# Patient Record
Sex: Female | Born: 1970 | Race: White | Hispanic: No | Smoking: Never smoker
Health system: Southern US, Community
[De-identification: ages and names within clinical notes are randomized; demographics above are authoritative.]

## PROBLEM LIST (undated history)

## (undated) DIAGNOSIS — Z9889 Other specified postprocedural states: Secondary | ICD-10-CM

## (undated) DIAGNOSIS — I509 Heart failure, unspecified: Secondary | ICD-10-CM

## (undated) DIAGNOSIS — R112 Nausea with vomiting, unspecified: Secondary | ICD-10-CM

## (undated) DIAGNOSIS — C50919 Malignant neoplasm of unspecified site of unspecified female breast: Secondary | ICD-10-CM

## (undated) DIAGNOSIS — N200 Calculus of kidney: Secondary | ICD-10-CM

## (undated) DIAGNOSIS — N35919 Unspecified urethral stricture, male, unspecified site: Secondary | ICD-10-CM

## (undated) DIAGNOSIS — N840 Polyp of corpus uteri: Secondary | ICD-10-CM

## (undated) HISTORY — DX: Polyp of corpus uteri: N84.0

## (undated) HISTORY — DX: Calculus of kidney: N20.0

---

## 1998-01-27 ENCOUNTER — Emergency Department (HOSPITAL_COMMUNITY): Admission: EM | Admit: 1998-01-27 | Discharge: 1998-01-27 | Payer: Self-pay | Admitting: Emergency Medicine

## 1999-03-28 ENCOUNTER — Other Ambulatory Visit: Admission: RE | Admit: 1999-03-28 | Discharge: 1999-03-28 | Payer: Self-pay | Admitting: Gynecology

## 2000-03-26 ENCOUNTER — Other Ambulatory Visit: Admission: RE | Admit: 2000-03-26 | Discharge: 2000-03-26 | Payer: Self-pay | Admitting: Gynecology

## 2001-05-06 ENCOUNTER — Other Ambulatory Visit: Admission: RE | Admit: 2001-05-06 | Discharge: 2001-05-06 | Payer: Self-pay | Admitting: Gynecology

## 2002-04-10 HISTORY — PX: BREAST LUMPECTOMY: SHX2

## 2002-05-11 DIAGNOSIS — C50919 Malignant neoplasm of unspecified site of unspecified female breast: Secondary | ICD-10-CM

## 2002-05-11 HISTORY — DX: Malignant neoplasm of unspecified site of unspecified female breast: C50.919

## 2002-05-21 ENCOUNTER — Other Ambulatory Visit: Admission: RE | Admit: 2002-05-21 | Discharge: 2002-05-21 | Payer: Self-pay | Admitting: Gynecology

## 2002-05-23 ENCOUNTER — Encounter: Payer: Self-pay | Admitting: Gynecology

## 2002-05-23 ENCOUNTER — Encounter (INDEPENDENT_AMBULATORY_CARE_PROVIDER_SITE_OTHER): Payer: Self-pay | Admitting: Specialist

## 2002-05-23 ENCOUNTER — Encounter: Admission: RE | Admit: 2002-05-23 | Discharge: 2002-05-23 | Payer: Self-pay | Admitting: Gynecology

## 2002-06-02 ENCOUNTER — Ambulatory Visit (HOSPITAL_COMMUNITY): Admission: RE | Admit: 2002-06-02 | Discharge: 2002-06-02 | Payer: Self-pay | Admitting: Surgery

## 2002-06-02 ENCOUNTER — Encounter: Payer: Self-pay | Admitting: Surgery

## 2002-06-03 ENCOUNTER — Encounter: Payer: Self-pay | Admitting: Surgery

## 2002-06-05 ENCOUNTER — Encounter: Payer: Self-pay | Admitting: Surgery

## 2002-06-05 ENCOUNTER — Encounter: Admission: RE | Admit: 2002-06-05 | Discharge: 2002-06-05 | Payer: Self-pay | Admitting: Surgery

## 2002-06-06 ENCOUNTER — Ambulatory Visit (HOSPITAL_COMMUNITY): Admission: RE | Admit: 2002-06-06 | Discharge: 2002-06-06 | Payer: Self-pay | Admitting: Surgery

## 2002-06-06 ENCOUNTER — Encounter (INDEPENDENT_AMBULATORY_CARE_PROVIDER_SITE_OTHER): Payer: Self-pay | Admitting: *Deleted

## 2002-06-06 ENCOUNTER — Ambulatory Visit (HOSPITAL_BASED_OUTPATIENT_CLINIC_OR_DEPARTMENT_OTHER): Admission: RE | Admit: 2002-06-06 | Discharge: 2002-06-06 | Payer: Self-pay | Admitting: Surgery

## 2002-06-06 ENCOUNTER — Encounter: Payer: Self-pay | Admitting: Surgery

## 2002-06-12 ENCOUNTER — Ambulatory Visit (HOSPITAL_COMMUNITY): Admission: RE | Admit: 2002-06-12 | Discharge: 2002-06-23 | Payer: Self-pay | Admitting: Surgery

## 2002-07-01 ENCOUNTER — Encounter: Payer: Self-pay | Admitting: Surgery

## 2002-07-01 ENCOUNTER — Ambulatory Visit (HOSPITAL_BASED_OUTPATIENT_CLINIC_OR_DEPARTMENT_OTHER): Admission: RE | Admit: 2002-07-01 | Discharge: 2002-07-01 | Payer: Self-pay | Admitting: Surgery

## 2002-07-03 ENCOUNTER — Ambulatory Visit (HOSPITAL_COMMUNITY): Admission: RE | Admit: 2002-07-03 | Discharge: 2002-07-03 | Payer: Self-pay | Admitting: Oncology

## 2002-07-03 ENCOUNTER — Encounter: Payer: Self-pay | Admitting: Oncology

## 2002-07-09 ENCOUNTER — Ambulatory Visit (HOSPITAL_COMMUNITY): Admission: RE | Admit: 2002-07-09 | Discharge: 2002-07-09 | Payer: Self-pay | Admitting: Oncology

## 2002-07-09 ENCOUNTER — Encounter: Payer: Self-pay | Admitting: Oncology

## 2002-07-16 ENCOUNTER — Ambulatory Visit (HOSPITAL_BASED_OUTPATIENT_CLINIC_OR_DEPARTMENT_OTHER): Admission: RE | Admit: 2002-07-16 | Discharge: 2002-07-16 | Payer: Self-pay | Admitting: Urology

## 2002-07-16 ENCOUNTER — Encounter: Payer: Self-pay | Admitting: Urology

## 2002-07-21 ENCOUNTER — Ambulatory Visit (HOSPITAL_BASED_OUTPATIENT_CLINIC_OR_DEPARTMENT_OTHER): Admission: RE | Admit: 2002-07-21 | Discharge: 2002-07-21 | Payer: Self-pay | Admitting: Urology

## 2002-07-21 ENCOUNTER — Encounter: Payer: Self-pay | Admitting: Urology

## 2002-10-23 ENCOUNTER — Encounter: Payer: Self-pay | Admitting: Oncology

## 2002-10-23 ENCOUNTER — Encounter: Admission: RE | Admit: 2002-10-23 | Discharge: 2002-10-23 | Payer: Self-pay | Admitting: Oncology

## 2002-10-29 ENCOUNTER — Ambulatory Visit: Admission: RE | Admit: 2002-10-29 | Discharge: 2002-10-29 | Payer: Self-pay | Admitting: Oncology

## 2002-11-06 ENCOUNTER — Ambulatory Visit: Admission: RE | Admit: 2002-11-06 | Discharge: 2003-01-08 | Payer: Self-pay | Admitting: Radiation Oncology

## 2002-11-10 ENCOUNTER — Encounter: Admission: RE | Admit: 2002-11-10 | Discharge: 2002-11-10 | Payer: Self-pay | Admitting: Radiation Oncology

## 2003-02-05 ENCOUNTER — Ambulatory Visit: Admission: RE | Admit: 2003-02-05 | Discharge: 2003-02-05 | Payer: Self-pay | Admitting: Radiation Oncology

## 2003-03-13 ENCOUNTER — Ambulatory Visit (HOSPITAL_BASED_OUTPATIENT_CLINIC_OR_DEPARTMENT_OTHER): Admission: RE | Admit: 2003-03-13 | Discharge: 2003-03-13 | Payer: Self-pay | Admitting: Surgery

## 2003-03-30 ENCOUNTER — Other Ambulatory Visit: Admission: RE | Admit: 2003-03-30 | Discharge: 2003-03-30 | Payer: Self-pay | Admitting: Gynecology

## 2003-08-06 ENCOUNTER — Ambulatory Visit: Admission: RE | Admit: 2003-08-06 | Discharge: 2003-08-06 | Payer: Self-pay | Admitting: Radiation Oncology

## 2003-09-01 ENCOUNTER — Encounter: Admission: RE | Admit: 2003-09-01 | Discharge: 2003-09-01 | Payer: Self-pay | Admitting: Oncology

## 2003-11-05 ENCOUNTER — Emergency Department (HOSPITAL_COMMUNITY): Admission: EM | Admit: 2003-11-05 | Discharge: 2003-11-05 | Payer: Self-pay | Admitting: Family Medicine

## 2003-12-10 ENCOUNTER — Ambulatory Visit (HOSPITAL_COMMUNITY): Admission: RE | Admit: 2003-12-10 | Discharge: 2003-12-10 | Payer: Self-pay | Admitting: Urology

## 2003-12-14 ENCOUNTER — Ambulatory Visit (HOSPITAL_COMMUNITY): Admission: EM | Admit: 2003-12-14 | Discharge: 2003-12-14 | Payer: Self-pay | Admitting: Emergency Medicine

## 2004-03-13 ENCOUNTER — Ambulatory Visit: Payer: Self-pay | Admitting: Oncology

## 2004-04-12 ENCOUNTER — Other Ambulatory Visit: Admission: RE | Admit: 2004-04-12 | Discharge: 2004-04-12 | Payer: Self-pay | Admitting: Gynecology

## 2004-06-13 ENCOUNTER — Ambulatory Visit: Payer: Self-pay | Admitting: Oncology

## 2004-08-09 ENCOUNTER — Ambulatory Visit: Payer: Self-pay | Admitting: Oncology

## 2004-08-24 ENCOUNTER — Ambulatory Visit (HOSPITAL_COMMUNITY): Admission: RE | Admit: 2004-08-24 | Discharge: 2004-08-24 | Payer: Self-pay | Admitting: Oncology

## 2004-09-06 ENCOUNTER — Encounter: Admission: RE | Admit: 2004-09-06 | Discharge: 2004-09-06 | Payer: Self-pay | Admitting: Oncology

## 2004-12-07 ENCOUNTER — Ambulatory Visit: Payer: Self-pay | Admitting: Oncology

## 2005-04-01 ENCOUNTER — Emergency Department (HOSPITAL_COMMUNITY): Admission: EM | Admit: 2005-04-01 | Discharge: 2005-04-02 | Payer: Self-pay | Admitting: Emergency Medicine

## 2005-04-07 ENCOUNTER — Ambulatory Visit (HOSPITAL_COMMUNITY): Admission: RE | Admit: 2005-04-07 | Discharge: 2005-04-07 | Payer: Self-pay | Admitting: Urology

## 2005-04-15 ENCOUNTER — Emergency Department (HOSPITAL_COMMUNITY): Admission: EM | Admit: 2005-04-15 | Discharge: 2005-04-15 | Payer: Self-pay | Admitting: Emergency Medicine

## 2005-06-01 ENCOUNTER — Ambulatory Visit: Payer: Self-pay | Admitting: Oncology

## 2005-07-20 ENCOUNTER — Encounter: Payer: Self-pay | Admitting: Cardiology

## 2005-07-20 ENCOUNTER — Ambulatory Visit: Payer: Self-pay

## 2005-07-27 ENCOUNTER — Ambulatory Visit: Payer: Self-pay

## 2005-08-01 ENCOUNTER — Ambulatory Visit: Payer: Self-pay | Admitting: Oncology

## 2005-09-08 ENCOUNTER — Encounter: Admission: RE | Admit: 2005-09-08 | Discharge: 2005-09-08 | Payer: Self-pay | Admitting: Oncology

## 2005-10-19 ENCOUNTER — Ambulatory Visit: Payer: Self-pay | Admitting: Oncology

## 2005-10-27 ENCOUNTER — Ambulatory Visit (HOSPITAL_COMMUNITY): Admission: RE | Admit: 2005-10-27 | Discharge: 2005-10-27 | Payer: Self-pay | Admitting: Oncology

## 2005-11-10 ENCOUNTER — Encounter: Payer: Self-pay | Admitting: Cardiology

## 2005-11-10 ENCOUNTER — Ambulatory Visit: Payer: Self-pay

## 2005-11-30 ENCOUNTER — Other Ambulatory Visit: Admission: RE | Admit: 2005-11-30 | Discharge: 2005-11-30 | Payer: Self-pay | Admitting: Gynecology

## 2006-01-18 ENCOUNTER — Ambulatory Visit: Payer: Self-pay | Admitting: Oncology

## 2006-02-02 ENCOUNTER — Ambulatory Visit (HOSPITAL_COMMUNITY): Admission: RE | Admit: 2006-02-02 | Discharge: 2006-02-02 | Payer: Self-pay | Admitting: Oncology

## 2006-04-16 ENCOUNTER — Ambulatory Visit: Payer: Self-pay | Admitting: Oncology

## 2006-04-19 LAB — LACTATE DEHYDROGENASE: LDH: 145 U/L (ref 94–250)

## 2006-04-19 LAB — CANCER ANTIGEN 27.29: CA 27.29: 16 U/mL (ref 0–39)

## 2006-04-20 ENCOUNTER — Ambulatory Visit (HOSPITAL_COMMUNITY): Admission: RE | Admit: 2006-04-20 | Discharge: 2006-04-20 | Payer: Self-pay | Admitting: Oncology

## 2006-08-06 ENCOUNTER — Ambulatory Visit: Payer: Self-pay | Admitting: Oncology

## 2006-08-08 ENCOUNTER — Ambulatory Visit (HOSPITAL_COMMUNITY): Admission: RE | Admit: 2006-08-08 | Discharge: 2006-08-08 | Payer: Self-pay | Admitting: Oncology

## 2006-08-09 LAB — RESEARCH LABS

## 2006-09-12 ENCOUNTER — Ambulatory Visit (HOSPITAL_COMMUNITY): Admission: RE | Admit: 2006-09-12 | Discharge: 2006-09-12 | Payer: Self-pay | Admitting: Oncology

## 2006-09-20 ENCOUNTER — Encounter: Admission: RE | Admit: 2006-09-20 | Discharge: 2006-09-20 | Payer: Self-pay | Admitting: Oncology

## 2006-10-15 ENCOUNTER — Ambulatory Visit (HOSPITAL_COMMUNITY): Admission: RE | Admit: 2006-10-15 | Discharge: 2006-10-15 | Payer: Self-pay | Admitting: Oncology

## 2007-01-29 ENCOUNTER — Ambulatory Visit: Payer: Self-pay | Admitting: Oncology

## 2007-02-06 LAB — CBC WITH DIFFERENTIAL/PLATELET
Basophils Absolute: 0 10*3/uL (ref 0.0–0.1)
EOS%: 2.1 % (ref 0.0–7.0)
Eosinophils Absolute: 0.1 10*3/uL (ref 0.0–0.5)
HGB: 14.1 g/dL (ref 11.6–15.9)
NEUT#: 2.5 10*3/uL (ref 1.5–6.5)
RBC: 4.33 10*6/uL (ref 3.70–5.32)
RDW: 12.9 % (ref 11.3–14.5)
lymph#: 1 10*3/uL (ref 0.9–3.3)

## 2007-02-06 LAB — LUTEINIZING HORMONE: LH: 9.1 m[IU]/mL

## 2007-02-06 LAB — COMPREHENSIVE METABOLIC PANEL
AST: 17 U/L (ref 0–37)
Albumin: 4.2 g/dL (ref 3.5–5.2)
BUN: 13 mg/dL (ref 6–23)
Calcium: 9.5 mg/dL (ref 8.4–10.5)
Chloride: 105 mEq/L (ref 96–112)
Potassium: 4.2 mEq/L (ref 3.5–5.3)
Sodium: 141 mEq/L (ref 135–145)
Total Protein: 6.8 g/dL (ref 6.0–8.3)

## 2007-02-28 ENCOUNTER — Ambulatory Visit (HOSPITAL_COMMUNITY): Admission: RE | Admit: 2007-02-28 | Discharge: 2007-02-28 | Payer: Self-pay | Admitting: Oncology

## 2007-07-12 ENCOUNTER — Ambulatory Visit (HOSPITAL_COMMUNITY): Admission: RE | Admit: 2007-07-12 | Discharge: 2007-07-12 | Payer: Self-pay | Admitting: Urology

## 2007-07-17 ENCOUNTER — Ambulatory Visit (HOSPITAL_COMMUNITY): Admission: RE | Admit: 2007-07-17 | Discharge: 2007-07-17 | Payer: Self-pay | Admitting: Urology

## 2007-07-19 ENCOUNTER — Ambulatory Visit: Payer: Self-pay | Admitting: Oncology

## 2007-07-24 LAB — CBC WITH DIFFERENTIAL/PLATELET
Eosinophils Absolute: 0.2 10*3/uL (ref 0.0–0.5)
HGB: 14.1 g/dL (ref 11.6–15.9)
MONO#: 0.4 10*3/uL (ref 0.1–0.9)
MONO%: 7.5 % (ref 0.0–13.0)
NEUT#: 2.9 10*3/uL (ref 1.5–6.5)
RBC: 4.4 10*6/uL (ref 3.70–5.32)
RDW: 12.3 % (ref 11.3–14.5)
WBC: 4.9 10*3/uL (ref 3.9–10.0)
lymph#: 1.4 10*3/uL (ref 0.9–3.3)

## 2007-07-25 LAB — COMPREHENSIVE METABOLIC PANEL
Albumin: 4.2 g/dL (ref 3.5–5.2)
Alkaline Phosphatase: 61 U/L (ref 39–117)
Calcium: 9.1 mg/dL (ref 8.4–10.5)
Chloride: 108 mEq/L (ref 96–112)
Glucose, Bld: 107 mg/dL — ABNORMAL HIGH (ref 70–99)
Potassium: 4.4 mEq/L (ref 3.5–5.3)
Sodium: 142 mEq/L (ref 135–145)
Total Protein: 6.8 g/dL (ref 6.0–8.3)

## 2007-07-25 LAB — LACTATE DEHYDROGENASE: LDH: 154 U/L (ref 94–250)

## 2007-07-30 LAB — VITAMIN D 1,25 DIHYDROXY: Vit D, 1,25-Dihydroxy: 88 pg/mL — ABNORMAL HIGH (ref 15–75)

## 2008-01-01 ENCOUNTER — Encounter: Admission: RE | Admit: 2008-01-01 | Discharge: 2008-01-01 | Payer: Self-pay | Admitting: Oncology

## 2008-01-28 ENCOUNTER — Ambulatory Visit: Payer: Self-pay | Admitting: Oncology

## 2008-01-30 LAB — CBC WITH DIFFERENTIAL/PLATELET
BASO%: 0.6 % (ref 0.0–2.0)
EOS%: 1.6 % (ref 0.0–7.0)
HCT: 39.8 % (ref 34.8–46.6)
LYMPH%: 29 % (ref 14.0–48.0)
MCH: 32.7 pg (ref 26.0–34.0)
MCHC: 35.8 g/dL (ref 32.0–36.0)
NEUT%: 60.6 % (ref 39.6–76.8)
Platelets: 224 10*3/uL (ref 145–400)
RBC: 4.35 10*6/uL (ref 3.70–5.32)
WBC: 5.2 10*3/uL (ref 3.9–10.0)
lymph#: 1.5 10*3/uL (ref 0.9–3.3)

## 2008-01-31 LAB — COMPREHENSIVE METABOLIC PANEL
ALT: 12 U/L (ref 0–35)
AST: 14 U/L (ref 0–37)
Alkaline Phosphatase: 66 U/L (ref 39–117)
BUN: 14 mg/dL (ref 6–23)
Creatinine, Ser: 0.81 mg/dL (ref 0.40–1.20)

## 2008-01-31 LAB — VITAMIN D 25 HYDROXY (VIT D DEFICIENCY, FRACTURES): Vit D, 25-Hydroxy: 37 ng/mL (ref 30–89)

## 2008-01-31 LAB — FOLLICLE STIMULATING HORMONE: FSH: 21.6 m[IU]/mL

## 2008-02-06 LAB — ESTRADIOL, ULTRA SENS

## 2008-08-04 ENCOUNTER — Ambulatory Visit: Payer: Self-pay | Admitting: Oncology

## 2008-08-06 LAB — CBC WITH DIFFERENTIAL/PLATELET
Basophils Absolute: 0 10*3/uL (ref 0.0–0.1)
EOS%: 1 % (ref 0.0–7.0)
Eosinophils Absolute: 0.1 10*3/uL (ref 0.0–0.5)
HCT: 42.1 % (ref 34.8–46.6)
HGB: 14.6 g/dL (ref 11.6–15.9)
MCH: 32 pg (ref 25.1–34.0)
MONO#: 0.5 10*3/uL (ref 0.1–0.9)
NEUT#: 4.1 10*3/uL (ref 1.5–6.5)
NEUT%: 68.6 % (ref 38.4–76.8)
RDW: 12.8 % (ref 11.2–14.5)
WBC: 6 10*3/uL (ref 3.9–10.3)
lymph#: 1.3 10*3/uL (ref 0.9–3.3)

## 2008-08-07 LAB — COMPREHENSIVE METABOLIC PANEL
AST: 14 U/L (ref 0–37)
Albumin: 4.5 g/dL (ref 3.5–5.2)
BUN: 13 mg/dL (ref 6–23)
CO2: 22 mEq/L (ref 19–32)
Calcium: 9.2 mg/dL (ref 8.4–10.5)
Chloride: 107 mEq/L (ref 96–112)
Creatinine, Ser: 0.85 mg/dL (ref 0.40–1.20)
Potassium: 3.6 mEq/L (ref 3.5–5.3)

## 2008-08-07 LAB — VITAMIN D 25 HYDROXY (VIT D DEFICIENCY, FRACTURES): Vit D, 25-Hydroxy: 40 ng/mL (ref 30–89)

## 2008-08-07 LAB — LACTATE DEHYDROGENASE: LDH: 169 U/L (ref 94–250)

## 2009-02-19 ENCOUNTER — Encounter: Admission: RE | Admit: 2009-02-19 | Discharge: 2009-02-19 | Payer: Self-pay | Admitting: Oncology

## 2009-02-26 ENCOUNTER — Ambulatory Visit: Payer: Self-pay | Admitting: Oncology

## 2009-03-02 LAB — CBC WITH DIFFERENTIAL/PLATELET
BASO%: 0.6 % (ref 0.0–2.0)
Eosinophils Absolute: 0.1 10*3/uL (ref 0.0–0.5)
MCHC: 34.2 g/dL (ref 31.5–36.0)
MCV: 92.8 fL (ref 79.5–101.0)
MONO#: 0.4 10*3/uL (ref 0.1–0.9)
MONO%: 5.6 % (ref 0.0–14.0)
NEUT#: 5.4 10*3/uL (ref 1.5–6.5)
RBC: 4.23 10*6/uL (ref 3.70–5.45)
RDW: 12.6 % (ref 11.2–14.5)
WBC: 7.9 10*3/uL (ref 3.9–10.3)

## 2009-03-03 LAB — VITAMIN D 25 HYDROXY (VIT D DEFICIENCY, FRACTURES): Vit D, 25-Hydroxy: 29 ng/mL — ABNORMAL LOW (ref 30–89)

## 2009-03-03 LAB — COMPREHENSIVE METABOLIC PANEL
ALT: 12 U/L (ref 0–35)
Albumin: 4.4 g/dL (ref 3.5–5.2)
Alkaline Phosphatase: 65 U/L (ref 39–117)
Glucose, Bld: 113 mg/dL — ABNORMAL HIGH (ref 70–99)
Potassium: 4 mEq/L (ref 3.5–5.3)
Sodium: 140 mEq/L (ref 135–145)
Total Bilirubin: 0.6 mg/dL (ref 0.3–1.2)
Total Protein: 7 g/dL (ref 6.0–8.3)

## 2009-03-03 LAB — LACTATE DEHYDROGENASE: LDH: 165 U/L (ref 94–250)

## 2009-03-03 LAB — FOLLICLE STIMULATING HORMONE: FSH: 13.4 m[IU]/mL

## 2009-03-03 LAB — CANCER ANTIGEN 27.29: CA 27.29: 20 U/mL (ref 0–39)

## 2009-03-11 ENCOUNTER — Ambulatory Visit (HOSPITAL_BASED_OUTPATIENT_CLINIC_OR_DEPARTMENT_OTHER): Admission: RE | Admit: 2009-03-11 | Discharge: 2009-03-11 | Payer: Self-pay | Admitting: Gynecology

## 2009-03-11 HISTORY — PX: HYSTEROSCOPY W/ ENDOMETRIAL ABLATION: SUR665

## 2009-03-15 LAB — ESTRADIOL, ULTRA SENS: Estradiol, Ultra Sensitive: 256 pg/mL

## 2009-07-19 ENCOUNTER — Ambulatory Visit: Payer: Self-pay | Admitting: Oncology

## 2009-07-21 LAB — LACTATE DEHYDROGENASE: LDH: 157 U/L (ref 94–250)

## 2009-07-21 LAB — CBC WITH DIFFERENTIAL/PLATELET
BASO%: 0.3 % (ref 0.0–2.0)
HCT: 38.6 % (ref 34.8–46.6)
HGB: 13.5 g/dL (ref 11.6–15.9)
LYMPH%: 23.5 % (ref 14.0–49.7)
MCH: 32.1 pg (ref 25.1–34.0)
MCHC: 34.8 g/dL (ref 31.5–36.0)
MCV: 92 fL (ref 79.5–101.0)
MONO#: 0.4 10*3/uL (ref 0.1–0.9)
MONO%: 6.4 % (ref 0.0–14.0)
NEUT#: 4.3 10*3/uL (ref 1.5–6.5)
RBC: 4.2 10*6/uL (ref 3.70–5.45)
RDW: 13 % (ref 11.2–14.5)
WBC: 6.2 10*3/uL (ref 3.9–10.3)

## 2009-07-21 LAB — VITAMIN D 25 HYDROXY (VIT D DEFICIENCY, FRACTURES): Vit D, 25-Hydroxy: 31 ng/mL (ref 30–89)

## 2009-07-21 LAB — COMPREHENSIVE METABOLIC PANEL
ALT: 11 U/L (ref 0–35)
Albumin: 4.1 g/dL (ref 3.5–5.2)
Alkaline Phosphatase: 69 U/L (ref 39–117)
Glucose, Bld: 88 mg/dL (ref 70–99)
Potassium: 4.1 mEq/L (ref 3.5–5.3)

## 2009-07-21 LAB — TSH: TSH: 1.439 u[IU]/mL (ref 0.350–4.500)

## 2010-01-07 ENCOUNTER — Ambulatory Visit: Payer: Self-pay | Admitting: Oncology

## 2010-03-07 ENCOUNTER — Encounter: Admission: RE | Admit: 2010-03-07 | Discharge: 2010-03-07 | Payer: Self-pay | Admitting: Oncology

## 2010-04-30 ENCOUNTER — Encounter: Payer: Self-pay | Admitting: Oncology

## 2010-05-01 ENCOUNTER — Encounter: Payer: Self-pay | Admitting: Oncology

## 2010-05-02 ENCOUNTER — Encounter: Payer: Self-pay | Admitting: Oncology

## 2010-07-21 ENCOUNTER — Encounter (HOSPITAL_BASED_OUTPATIENT_CLINIC_OR_DEPARTMENT_OTHER): Payer: BC Managed Care – PPO | Admitting: Oncology

## 2010-07-21 ENCOUNTER — Other Ambulatory Visit: Payer: Self-pay | Admitting: Oncology

## 2010-07-21 DIAGNOSIS — C50919 Malignant neoplasm of unspecified site of unspecified female breast: Secondary | ICD-10-CM

## 2010-07-21 LAB — CBC WITH DIFFERENTIAL/PLATELET
BASO%: 1.1 % (ref 0.0–2.0)
Basophils Absolute: 0.1 10*3/uL (ref 0.0–0.1)
Eosinophils Absolute: 0.1 10*3/uL (ref 0.0–0.5)
HGB: 14.2 g/dL (ref 11.6–15.9)
LYMPH%: 24.7 % (ref 14.0–49.7)
MCH: 31.5 pg (ref 25.1–34.0)
MCHC: 34.7 g/dL (ref 31.5–36.0)
MCV: 90.7 fL (ref 79.5–101.0)
MONO#: 0.5 10*3/uL (ref 0.1–0.9)
MONO%: 6.5 % (ref 0.0–14.0)
RBC: 4.51 10*6/uL (ref 3.70–5.45)

## 2010-07-22 LAB — COMPREHENSIVE METABOLIC PANEL
ALT: 19 U/L (ref 0–35)
Alkaline Phosphatase: 106 U/L (ref 39–117)
CO2: 24 mEq/L (ref 19–32)
Creatinine, Ser: 0.86 mg/dL (ref 0.40–1.20)
Glucose, Bld: 92 mg/dL (ref 70–99)
Total Bilirubin: 0.6 mg/dL (ref 0.3–1.2)

## 2010-07-22 LAB — CANCER ANTIGEN 27.29: CA 27.29: 196 U/mL — ABNORMAL HIGH (ref 0–39)

## 2010-07-22 LAB — LACTATE DEHYDROGENASE: LDH: 274 U/L — ABNORMAL HIGH (ref 94–250)

## 2010-07-27 ENCOUNTER — Other Ambulatory Visit: Payer: Self-pay | Admitting: Oncology

## 2010-07-27 ENCOUNTER — Encounter (HOSPITAL_BASED_OUTPATIENT_CLINIC_OR_DEPARTMENT_OTHER): Payer: BC Managed Care – PPO | Admitting: Oncology

## 2010-07-27 DIAGNOSIS — C50919 Malignant neoplasm of unspecified site of unspecified female breast: Secondary | ICD-10-CM

## 2010-07-27 LAB — CANCER ANTIGEN 27.29: CA 27.29: 223 U/mL — ABNORMAL HIGH (ref 0–39)

## 2010-07-27 LAB — CEA: CEA: 13.6 ng/mL — ABNORMAL HIGH (ref 0.0–5.0)

## 2010-07-28 ENCOUNTER — Ambulatory Visit (HOSPITAL_COMMUNITY)
Admission: RE | Admit: 2010-07-28 | Discharge: 2010-07-28 | Disposition: A | Discharge status: Home / Self Care | Payer: BC Managed Care – PPO | Source: Ambulatory Visit | Attending: Oncology | Admitting: Oncology

## 2010-07-28 ENCOUNTER — Other Ambulatory Visit: Payer: Self-pay | Admitting: Oncology

## 2010-07-28 ENCOUNTER — Encounter (HOSPITAL_BASED_OUTPATIENT_CLINIC_OR_DEPARTMENT_OTHER): Payer: BC Managed Care – PPO | Admitting: Oncology

## 2010-07-28 DIAGNOSIS — C50919 Malignant neoplasm of unspecified site of unspecified female breast: Secondary | ICD-10-CM

## 2010-07-28 DIAGNOSIS — Z853 Personal history of malignant neoplasm of breast: Secondary | ICD-10-CM | POA: Insufficient documentation

## 2010-07-28 DIAGNOSIS — C787 Secondary malignant neoplasm of liver and intrahepatic bile duct: Secondary | ICD-10-CM

## 2010-08-02 ENCOUNTER — Ambulatory Visit (HOSPITAL_COMMUNITY)
Admission: RE | Admit: 2010-08-02 | Discharge: 2010-08-02 | Disposition: A | Discharge status: Home / Self Care | Payer: BC Managed Care – PPO | Source: Ambulatory Visit | Attending: Oncology | Admitting: Oncology

## 2010-08-02 ENCOUNTER — Other Ambulatory Visit: Payer: Self-pay | Admitting: Oncology

## 2010-08-02 ENCOUNTER — Encounter (HOSPITAL_COMMUNITY)
Admission: RE | Admit: 2010-08-02 | Discharge: 2010-08-02 | Disposition: A | Discharge status: Home / Self Care | Payer: BC Managed Care – PPO | Source: Ambulatory Visit | Attending: Oncology | Admitting: Oncology

## 2010-08-02 DIAGNOSIS — N200 Calculus of kidney: Secondary | ICD-10-CM | POA: Insufficient documentation

## 2010-08-02 DIAGNOSIS — C50919 Malignant neoplasm of unspecified site of unspecified female breast: Secondary | ICD-10-CM

## 2010-08-02 DIAGNOSIS — R599 Enlarged lymph nodes, unspecified: Secondary | ICD-10-CM | POA: Insufficient documentation

## 2010-08-02 DIAGNOSIS — C7951 Secondary malignant neoplasm of bone: Secondary | ICD-10-CM | POA: Insufficient documentation

## 2010-08-02 DIAGNOSIS — R51 Headache: Secondary | ICD-10-CM | POA: Insufficient documentation

## 2010-08-02 DIAGNOSIS — C787 Secondary malignant neoplasm of liver and intrahepatic bile duct: Secondary | ICD-10-CM | POA: Insufficient documentation

## 2010-08-02 MED ORDER — FLUDEOXYGLUCOSE F - 18 (FDG) INJECTION
16.5000 | Freq: Once | INTRAVENOUS | Status: AC | PRN
  Administered 2010-08-02: 16.5 via INTRAVENOUS

## 2010-08-02 MED ORDER — GADOBENATE DIMEGLUMINE 529 MG/ML IV SOLN
20.0000 mL | Freq: Once | INTRAVENOUS | Status: AC | PRN
  Administered 2010-08-02: 20 mL via INTRAVENOUS

## 2010-08-03 ENCOUNTER — Encounter: Payer: BC Managed Care – PPO | Admitting: Oncology

## 2010-08-03 ENCOUNTER — Other Ambulatory Visit: Payer: Self-pay | Admitting: Oncology

## 2010-08-03 DIAGNOSIS — Z5111 Encounter for antineoplastic chemotherapy: Secondary | ICD-10-CM

## 2010-08-03 DIAGNOSIS — C50919 Malignant neoplasm of unspecified site of unspecified female breast: Secondary | ICD-10-CM

## 2010-08-03 DIAGNOSIS — M899 Disorder of bone, unspecified: Secondary | ICD-10-CM

## 2010-08-04 ENCOUNTER — Other Ambulatory Visit: Payer: Self-pay | Admitting: Oncology

## 2010-08-04 DIAGNOSIS — C50919 Malignant neoplasm of unspecified site of unspecified female breast: Secondary | ICD-10-CM

## 2010-08-05 ENCOUNTER — Ambulatory Visit (HOSPITAL_COMMUNITY)
Admission: RE | Admit: 2010-08-05 | Discharge: 2010-08-05 | Disposition: A | Discharge status: Home / Self Care | Payer: BC Managed Care – PPO | Source: Ambulatory Visit | Attending: Oncology | Admitting: Oncology

## 2010-08-05 ENCOUNTER — Other Ambulatory Visit: Payer: Self-pay | Admitting: Oncology

## 2010-08-05 ENCOUNTER — Other Ambulatory Visit: Payer: Self-pay | Admitting: Interventional Radiology

## 2010-08-05 ENCOUNTER — Ambulatory Visit (HOSPITAL_COMMUNITY): Payer: BC Managed Care – PPO

## 2010-08-05 DIAGNOSIS — C50919 Malignant neoplasm of unspecified site of unspecified female breast: Secondary | ICD-10-CM

## 2010-08-05 DIAGNOSIS — Z853 Personal history of malignant neoplasm of breast: Secondary | ICD-10-CM | POA: Insufficient documentation

## 2010-08-05 DIAGNOSIS — C7951 Secondary malignant neoplasm of bone: Secondary | ICD-10-CM | POA: Insufficient documentation

## 2010-08-05 DIAGNOSIS — C787 Secondary malignant neoplasm of liver and intrahepatic bile duct: Secondary | ICD-10-CM | POA: Insufficient documentation

## 2010-08-05 LAB — CBC
MCH: 30 pg (ref 26.0–34.0)
MCHC: 34.8 g/dL (ref 30.0–36.0)
Platelets: 212 10*3/uL (ref 150–400)
RBC: 4.46 MIL/uL (ref 3.87–5.11)
RDW: 11.9 % (ref 11.5–15.5)

## 2010-08-05 LAB — PROTIME-INR: Prothrombin Time: 13.8 seconds (ref 11.6–15.2)

## 2010-08-08 ENCOUNTER — Ambulatory Visit (HOSPITAL_COMMUNITY)
Admission: RE | Admit: 2010-08-08 | Discharge: 2010-08-08 | Disposition: A | Discharge status: Home / Self Care | Payer: BC Managed Care – PPO | Source: Ambulatory Visit | Attending: Oncology | Admitting: Oncology

## 2010-08-08 DIAGNOSIS — M542 Cervicalgia: Secondary | ICD-10-CM | POA: Insufficient documentation

## 2010-08-08 DIAGNOSIS — C50919 Malignant neoplasm of unspecified site of unspecified female breast: Secondary | ICD-10-CM

## 2010-08-08 DIAGNOSIS — Z853 Personal history of malignant neoplasm of breast: Secondary | ICD-10-CM | POA: Insufficient documentation

## 2010-08-08 DIAGNOSIS — R51 Headache: Secondary | ICD-10-CM | POA: Insufficient documentation

## 2010-08-08 MED ORDER — GADOBENATE DIMEGLUMINE 529 MG/ML IV SOLN
15.0000 mL | Freq: Once | INTRAVENOUS | Status: AC | PRN
  Administered 2010-08-08: 15 mL via INTRAVENOUS

## 2010-08-09 ENCOUNTER — Other Ambulatory Visit: Payer: Self-pay | Admitting: Oncology

## 2010-08-09 DIAGNOSIS — C50919 Malignant neoplasm of unspecified site of unspecified female breast: Secondary | ICD-10-CM

## 2010-08-10 ENCOUNTER — Other Ambulatory Visit: Payer: Self-pay | Admitting: Oncology

## 2010-08-10 ENCOUNTER — Ambulatory Visit (HOSPITAL_COMMUNITY)
Admission: RE | Admit: 2010-08-10 | Discharge: 2010-08-10 | Disposition: A | Discharge status: Home / Self Care | Payer: BC Managed Care – PPO | Source: Ambulatory Visit | Attending: Oncology | Admitting: Oncology

## 2010-08-10 DIAGNOSIS — C50919 Malignant neoplasm of unspecified site of unspecified female breast: Secondary | ICD-10-CM | POA: Insufficient documentation

## 2010-08-10 DIAGNOSIS — I079 Rheumatic tricuspid valve disease, unspecified: Secondary | ICD-10-CM | POA: Insufficient documentation

## 2010-08-10 DIAGNOSIS — Z5111 Encounter for antineoplastic chemotherapy: Secondary | ICD-10-CM

## 2010-08-10 DIAGNOSIS — Z01818 Encounter for other preprocedural examination: Secondary | ICD-10-CM | POA: Insufficient documentation

## 2010-08-12 ENCOUNTER — Encounter (HOSPITAL_BASED_OUTPATIENT_CLINIC_OR_DEPARTMENT_OTHER): Payer: BC Managed Care – PPO | Admitting: Oncology

## 2010-08-12 DIAGNOSIS — C50919 Malignant neoplasm of unspecified site of unspecified female breast: Secondary | ICD-10-CM

## 2010-08-12 DIAGNOSIS — Z5111 Encounter for antineoplastic chemotherapy: Secondary | ICD-10-CM

## 2010-08-18 ENCOUNTER — Encounter (HOSPITAL_BASED_OUTPATIENT_CLINIC_OR_DEPARTMENT_OTHER): Payer: BC Managed Care – PPO | Admitting: Oncology

## 2010-08-18 DIAGNOSIS — C50919 Malignant neoplasm of unspecified site of unspecified female breast: Secondary | ICD-10-CM

## 2010-08-18 DIAGNOSIS — Z5112 Encounter for antineoplastic immunotherapy: Secondary | ICD-10-CM

## 2010-08-23 NOTE — Op Note (Signed)
NAME:  Kathy Jimenez, Kathy Jimenez             ACCOUNT NO.:  [id number]   MEDICAL RECORD NO.:  [id number]          PATIENT TYPE:  AMB   LOCATION:  DAY                          FACILITY:  [facility]   PHYSICIAN:  Courtney Paris, M.D.DATE OF BIRTH:  Aug 02, 1970   DATE OF PROCEDURE:  07/17/2007  DATE OF DISCHARGE:                               OPERATIVE REPORT   PREOPERATIVE DIAGNOSIS:  Right ureteral stone.   POSTOPERATIVE DIAGNOSIS:  Right ureteral stone.   PROCEDURE:  1. Cystourethroscopy.  2. Right ureteroscopy with laser lithotripsy and stone manipulation.  3. Placement of the right 6 x 24 double-J ureteral stent.   ANESTHESIA:  General.   INDICATIONS FOR PROCEDURE:  Kathy Jimenez is a 40 year old white female with  past medical history significant for nephroureterolithiasis.  She has  had multiple interventions in the past.  She has been followed closely  by Dr. Aldean Ast for this current right ureteral stone.  She has been  asymptomatic with this with a slight amount hydroureteronephrosis for  the last couple of months, however, recently she began having some  discomfort and her hydroureteronephrosis had increased significantly.  She was taken to the operating room last Friday for attempted  ureteroscopic stone manipulation.  She was noted to have a narrowing at  the junction of the middle third and distal third of her right ureter  that would not safely allow passage of the semi-rigid ureteroscope, so  Dr. Aldean Ast elected to place a ureteral stent and allow the ureter to  dilate up passively over the stent and then come back for repeat  ureteroscopy.  Up until this point, she has been on antibiotics since  her last procedure and she has done well with the stent.   PROCEDURE IN DETAIL:  The patient was brought to the operating room,  placed in supine position.  She was correctly identified by wristband  and the appropriate time-out was taken.  IV antibiotics were given and  general  anesthesia was delivered.  Once adequately anesthetized, she was  placed in dorsal lithotomy position with great care taken to minimize  the risk of peripheral neuropathy or compartment syndrome.  Her perineum  was prepped and draped sterilely.  We began our procedure by performing  rigid cystourethroscopy with a 22-French rigid cystoscopic sheath, 12  degree lens and normal saline as our irrigant.  Her urethra was normal  course and caliber.  Upon entry into the bladder, clear urine was  identified.  The right ureteral stent was noted to be effluxing clear  urine.  There was no bladder, foreign body seen, no stone material was  noted either.  Left ureteral orifice was also normal.  We grasped the  distal end of the right ureteral stent with flexible graspers and  externalized it through the urethra.  We then advanced a sensor tip  guidewire through the stent into the right renal pelvis under direct  fluoroscopic guidance.  We then removed the stent and left the guidewire  in place.  We then secured.  We also placed an 8-French Foley catheter  with 5 mL balloon on it to  decompress the bladder during ureteroscopy.  We advanced the semi-rigid ureteroscope into the right ureteral orifice  and guided it antegrade up the right ureter.  The distal ureter was  capacious in size secondary to the stent.  We did encounter the  ridge/lip of ureteral mucosa that Dr. Aldean Ast had initially  encountered.  We checked the location of the tip of our scope with  fluoroscopy and this was noted to be right at the cortex of the inferior  pubic ramus.  This is exactly where he found this abnormality on the  previous ureteroscopy.  We were able to bypass this rim of tissue  without significant difficulty and then encountered the ureteral stone.  It encompassed approximately 50% of the ureteral diameter and was a  fairly long stone.  It also appeared to be impacted into the posterior  wall of the ureter.  We  then used a 270 nanometer laser fiber with  settings 6 Hz and 0.6 joules and systematically dusted the stone into  very small fragments.  We then removed the laser fiber and advanced a  nitinol basket into the ureter and we were able to grasp a couple of the  smaller stone fragments and bring them back out and placed them into the  bladder.  Once our lithotripsy and stone manipulation was complete, we  then advanced the semi-rigid ureteroscope back up into the ureter and  advanced it proximally past the area of the obstruction.  No ureteral  abnormality was seen.  No stone fragments were noted.  The ureter was  essentially clear.  We then removed the Foley catheter and the flexible  ureteroscope.  We then back-loaded the cystoscope over the guidewire and  placed a 6 x 24 double-J stent over this using fluoroscopy and direct  vision.  Fluoroscopy verified the placement of the proximal curl to be  in the right renal pelvis and cystoscopy demonstrated the distal curl to  be in the bladder.  We did leave a tether on the stent.  We used the  cystoscope to also remove the majority of her stone fragments from her  bladder and these will be sent with the patient, possibly sent for stone  analysis.  Her bladder was drained and this marked the end of our  procedure. She tolerated the procedure well.  There were no  complications.  She awoke and was taken to the recovery room in stable  condition.  Dr. Aldean Ast was present and participated in all aspects of  the case.     ______________________________  Delman Kitten, M.D.      Courtney Paris, M.D.  Electronically Signed    DW/MEDQ  D:  07/17/2007  T:  07/17/2007  Job:  [id number]

## 2010-08-23 NOTE — Op Note (Signed)
NAME:  Kathy Jimenez, Kathy Jimenez             ACCOUNT NO.:  [id number]   MEDICAL RECORD NO.:  [id number]          PATIENT TYPE:  AMB   LOCATION:  DAY                          [facility]ACILITY:  [facility]   PHYSICIAN:  Courtney Paris, M.D.DATE OF BIRTH:  [birth year]   DATE OF PROCEDURE:  07/12/2007  DATE OF DISCHARGE:                               OPERATIVE REPORT   PREOPERATIVE DIAGNOSIS:  Right distal ureteral stone.   POSTOPERATIVE DIAGNOSIS:  Right distal ureteral stone plus ureteral  stricture.   PROCEDURE:  Cystoscopy, right retrograde pyelogram, right ureteroscopy,  and insertion of right ureteral stent.   ANESTHESIA:  General.   SURGEON:  Courtney Paris, M.D.   BRIEF HISTORY:  This 40 year old white female with previous breast  cancer 4 1/2 years ago comes in with recurrent stones.  She has had  stones since she was 7, she has had a stone in her right distal ureter  since at least February 2009.  It was just below the pelvic rim at that  time.  She came back this week and had significant hydronephrosis on the  CT scan and the stone was still over the sacrum on the right side.  She  enters now to have the stone removed cystoscopically, possibly with  holmium laser if necessary.   DESCRIPTION OF PROCEDURE:  The patient was placed on the table in the  dorsal lithotomy position. After the satisfactory induction of general  anesthesia, was prepped and draped with Betadine in the usual sterile  fashion.  She was given IV Cipro.  The panendoscope was inserted and the  bladder inspected.  No lesions were seen.  The right orifice was normal  in appearance.  A 6 open ended ureteral catheter was inserted and an  occlusive retrograde demonstrated what looked like a narrow area just  below the pelvic rim and a dilated area just below that where the stone  was thought to be.  There was some moderate hydronephrosis.   I was able to pass the sensor guidewire past the stone without too much  difficulty, got up to the level of the kidney under fluoroscopy, and  then removed the cystoscope. I dilated the distal ureter with the short  ureteral access sheath under fluoroscopy.  Following this, leaving the  guidewire in place, the 6 short ureteroscope was then passed through the  right ureteral orifice up to the area of narrowing which was seen just  below the pelvic rim.  This was about the area on the retrograde that  showed a narrowed area.  It looked like a stricture and I tried for  several minutes with pressure to slowly pressed the scope through this,  but was unable to do so.  For fear of causing an injury to the ureter, I  went ahead and removed the scope and back loaded the wire over the  cystoscope and inserted a 6-French x 24 cm length double-J ureteral  stent under fluoroscopy with good position.  The guidewire was removed  and the stent was in good position.  The bladder was drained, a B&O  suppository inserted, the  patient was taken to the recovery room in good  condition.   The plan is to let the stent soft catheter dilate the stricture over the  next several days and will try again next week to get through the  stricture and remove the stone.      Courtney Paris, M.D.  Electronically Signed     HMK/MEDQ  D:  07/12/2007  T:  07/12/2007  Job:  [id number]

## 2010-08-26 ENCOUNTER — Encounter (HOSPITAL_BASED_OUTPATIENT_CLINIC_OR_DEPARTMENT_OTHER): Payer: BC Managed Care – PPO | Admitting: Oncology

## 2010-08-26 DIAGNOSIS — C50919 Malignant neoplasm of unspecified site of unspecified female breast: Secondary | ICD-10-CM

## 2010-08-26 DIAGNOSIS — Z5112 Encounter for antineoplastic immunotherapy: Secondary | ICD-10-CM

## 2010-09-02 ENCOUNTER — Encounter (HOSPITAL_BASED_OUTPATIENT_CLINIC_OR_DEPARTMENT_OTHER): Payer: BC Managed Care – PPO | Admitting: Oncology

## 2010-09-02 ENCOUNTER — Other Ambulatory Visit: Payer: Self-pay | Admitting: Oncology

## 2010-09-02 DIAGNOSIS — M899 Disorder of bone, unspecified: Secondary | ICD-10-CM

## 2010-09-02 DIAGNOSIS — C7951 Secondary malignant neoplasm of bone: Secondary | ICD-10-CM

## 2010-09-02 DIAGNOSIS — Z5112 Encounter for antineoplastic immunotherapy: Secondary | ICD-10-CM

## 2010-09-02 DIAGNOSIS — Z5111 Encounter for antineoplastic chemotherapy: Secondary | ICD-10-CM

## 2010-09-02 DIAGNOSIS — C50919 Malignant neoplasm of unspecified site of unspecified female breast: Secondary | ICD-10-CM

## 2010-09-02 LAB — CMP (CANCER CENTER ONLY)
AST: 27 U/L (ref 11–38)
Albumin: 3.6 g/dL (ref 3.3–5.5)
BUN, Bld: 11 mg/dL (ref 7–22)
CO2: 25 mEq/L (ref 18–33)
Calcium: 8.4 mg/dL (ref 8.0–10.3)
Chloride: 103 mEq/L (ref 98–108)
Glucose, Bld: 94 mg/dL (ref 73–118)
Sodium: 141 mEq/L (ref 128–145)

## 2010-09-02 LAB — CBC WITH DIFFERENTIAL/PLATELET
BASO%: 0.8 % (ref 0.0–2.0)
EOS%: 3.1 % (ref 0.0–7.0)
HCT: 39.5 % (ref 34.8–46.6)
LYMPH%: 24.8 % (ref 14.0–49.7)
MCH: 30.6 pg (ref 25.1–34.0)
MCHC: 34.3 g/dL (ref 31.5–36.0)
MCV: 89 fL (ref 79.5–101.0)
MONO%: 8.2 % (ref 0.0–14.0)
NEUT%: 63.1 % (ref 38.4–76.8)
Platelets: 207 10*3/uL (ref 145–400)
RBC: 4.44 10*6/uL (ref 3.70–5.45)
WBC: 4.8 10*3/uL (ref 3.9–10.3)

## 2010-09-02 LAB — CANCER ANTIGEN 27.29: CA 27.29: 119 U/mL — ABNORMAL HIGH (ref 0–39)

## 2010-09-09 ENCOUNTER — Encounter (HOSPITAL_BASED_OUTPATIENT_CLINIC_OR_DEPARTMENT_OTHER): Payer: BC Managed Care – PPO | Admitting: Oncology

## 2010-09-09 DIAGNOSIS — Z5112 Encounter for antineoplastic immunotherapy: Secondary | ICD-10-CM

## 2010-09-09 DIAGNOSIS — C50919 Malignant neoplasm of unspecified site of unspecified female breast: Secondary | ICD-10-CM

## 2010-09-15 ENCOUNTER — Encounter (HOSPITAL_BASED_OUTPATIENT_CLINIC_OR_DEPARTMENT_OTHER): Payer: BC Managed Care – PPO | Admitting: Oncology

## 2010-09-15 DIAGNOSIS — C50919 Malignant neoplasm of unspecified site of unspecified female breast: Secondary | ICD-10-CM

## 2010-09-15 DIAGNOSIS — Z5112 Encounter for antineoplastic immunotherapy: Secondary | ICD-10-CM

## 2010-09-23 ENCOUNTER — Encounter (HOSPITAL_BASED_OUTPATIENT_CLINIC_OR_DEPARTMENT_OTHER): Payer: BC Managed Care – PPO | Admitting: Oncology

## 2010-09-29 ENCOUNTER — Other Ambulatory Visit: Payer: Self-pay | Admitting: Oncology

## 2010-09-29 DIAGNOSIS — C50919 Malignant neoplasm of unspecified site of unspecified female breast: Secondary | ICD-10-CM

## 2010-09-29 DIAGNOSIS — Z5111 Encounter for antineoplastic chemotherapy: Secondary | ICD-10-CM

## 2010-09-29 DIAGNOSIS — Z5112 Encounter for antineoplastic immunotherapy: Secondary | ICD-10-CM

## 2010-09-29 LAB — CBC WITH DIFFERENTIAL/PLATELET
BASO%: 0.5 % (ref 0.0–2.0)
LYMPH%: 25.7 % (ref 14.0–49.7)
MCHC: 34.8 g/dL (ref 31.5–36.0)
MONO#: 0.4 10*3/uL (ref 0.1–0.9)
MONO%: 7.7 % (ref 0.0–14.0)
Platelets: 217 10*3/uL (ref 145–400)
RBC: 4.34 10*6/uL (ref 3.70–5.45)
WBC: 5.4 10*3/uL (ref 3.9–10.3)

## 2010-09-29 LAB — COMPREHENSIVE METABOLIC PANEL
ALT: 20 U/L (ref 0–35)
Alkaline Phosphatase: 79 U/L (ref 39–117)
CO2: 24 mEq/L (ref 19–32)
Sodium: 142 mEq/L (ref 135–145)
Total Bilirubin: 0.5 mg/dL (ref 0.3–1.2)
Total Protein: 6.6 g/dL (ref 6.0–8.3)

## 2010-09-30 ENCOUNTER — Other Ambulatory Visit: Payer: Self-pay | Admitting: Oncology

## 2010-09-30 ENCOUNTER — Encounter (HOSPITAL_BASED_OUTPATIENT_CLINIC_OR_DEPARTMENT_OTHER): Payer: BC Managed Care – PPO | Admitting: Oncology

## 2010-09-30 DIAGNOSIS — C50919 Malignant neoplasm of unspecified site of unspecified female breast: Secondary | ICD-10-CM

## 2010-09-30 DIAGNOSIS — Z5111 Encounter for antineoplastic chemotherapy: Secondary | ICD-10-CM

## 2010-10-04 ENCOUNTER — Other Ambulatory Visit: Payer: Self-pay | Admitting: Oncology

## 2010-10-04 DIAGNOSIS — C50919 Malignant neoplasm of unspecified site of unspecified female breast: Secondary | ICD-10-CM

## 2010-10-05 ENCOUNTER — Ambulatory Visit (HOSPITAL_COMMUNITY)
Admission: RE | Admit: 2010-10-05 | Discharge: 2010-10-05 | Disposition: A | Discharge status: Home / Self Care | Payer: BC Managed Care – PPO | Source: Ambulatory Visit | Attending: Oncology | Admitting: Oncology

## 2010-10-05 DIAGNOSIS — D1809 Hemangioma of other sites: Secondary | ICD-10-CM | POA: Insufficient documentation

## 2010-10-05 DIAGNOSIS — D1802 Hemangioma of intracranial structures: Secondary | ICD-10-CM | POA: Insufficient documentation

## 2010-10-05 DIAGNOSIS — C50919 Malignant neoplasm of unspecified site of unspecified female breast: Secondary | ICD-10-CM

## 2010-10-05 DIAGNOSIS — C7951 Secondary malignant neoplasm of bone: Secondary | ICD-10-CM | POA: Insufficient documentation

## 2010-10-05 DIAGNOSIS — M545 Low back pain, unspecified: Secondary | ICD-10-CM | POA: Insufficient documentation

## 2010-10-05 MED ORDER — GADOBENATE DIMEGLUMINE 529 MG/ML IV SOLN
15.0000 mL | Freq: Once | INTRAVENOUS | Status: AC | PRN
  Administered 2010-10-05: 15 mL via INTRAVENOUS

## 2010-10-07 ENCOUNTER — Encounter (HOSPITAL_BASED_OUTPATIENT_CLINIC_OR_DEPARTMENT_OTHER): Payer: BC Managed Care – PPO | Admitting: Oncology

## 2010-10-07 DIAGNOSIS — C50919 Malignant neoplasm of unspecified site of unspecified female breast: Secondary | ICD-10-CM

## 2010-10-07 DIAGNOSIS — Z5111 Encounter for antineoplastic chemotherapy: Secondary | ICD-10-CM

## 2010-10-11 ENCOUNTER — Ambulatory Visit (HOSPITAL_COMMUNITY)
Admission: RE | Admit: 2010-10-11 | Discharge: 2010-10-11 | Disposition: A | Discharge status: Home / Self Care | Payer: BC Managed Care – PPO | Source: Ambulatory Visit | Attending: Oncology | Admitting: Oncology

## 2010-10-11 ENCOUNTER — Other Ambulatory Visit: Payer: Self-pay | Admitting: Oncology

## 2010-10-11 ENCOUNTER — Emergency Department (HOSPITAL_COMMUNITY)
Admission: EM | Admit: 2010-10-11 | Discharge: 2010-10-11 | Disposition: A | Discharge status: Home / Self Care | Payer: BC Managed Care – PPO | Attending: Emergency Medicine | Admitting: Emergency Medicine

## 2010-10-11 ENCOUNTER — Encounter (HOSPITAL_COMMUNITY): Payer: Self-pay

## 2010-10-11 ENCOUNTER — Encounter (HOSPITAL_BASED_OUTPATIENT_CLINIC_OR_DEPARTMENT_OTHER): Payer: BC Managed Care – PPO | Admitting: Oncology

## 2010-10-11 DIAGNOSIS — G893 Neoplasm related pain (acute) (chronic): Secondary | ICD-10-CM

## 2010-10-11 DIAGNOSIS — C50919 Malignant neoplasm of unspecified site of unspecified female breast: Secondary | ICD-10-CM

## 2010-10-11 DIAGNOSIS — Z5111 Encounter for antineoplastic chemotherapy: Secondary | ICD-10-CM

## 2010-10-11 DIAGNOSIS — N201 Calculus of ureter: Secondary | ICD-10-CM | POA: Insufficient documentation

## 2010-10-11 DIAGNOSIS — R319 Hematuria, unspecified: Secondary | ICD-10-CM | POA: Insufficient documentation

## 2010-10-11 DIAGNOSIS — R109 Unspecified abdominal pain: Secondary | ICD-10-CM | POA: Insufficient documentation

## 2010-10-11 DIAGNOSIS — C7952 Secondary malignant neoplasm of bone marrow: Secondary | ICD-10-CM | POA: Insufficient documentation

## 2010-10-11 DIAGNOSIS — K449 Diaphragmatic hernia without obstruction or gangrene: Secondary | ICD-10-CM | POA: Insufficient documentation

## 2010-10-11 DIAGNOSIS — C7900 Secondary malignant neoplasm of unspecified kidney and renal pelvis: Secondary | ICD-10-CM | POA: Insufficient documentation

## 2010-10-11 DIAGNOSIS — N1330 Unspecified hydronephrosis: Secondary | ICD-10-CM | POA: Insufficient documentation

## 2010-10-11 DIAGNOSIS — C7951 Secondary malignant neoplasm of bone: Secondary | ICD-10-CM | POA: Insufficient documentation

## 2010-10-11 DIAGNOSIS — Z5112 Encounter for antineoplastic immunotherapy: Secondary | ICD-10-CM

## 2010-10-11 DIAGNOSIS — C787 Secondary malignant neoplasm of liver and intrahepatic bile duct: Secondary | ICD-10-CM

## 2010-10-11 DIAGNOSIS — Z79899 Other long term (current) drug therapy: Secondary | ICD-10-CM | POA: Insufficient documentation

## 2010-10-11 DIAGNOSIS — R112 Nausea with vomiting, unspecified: Secondary | ICD-10-CM | POA: Insufficient documentation

## 2010-10-11 LAB — URINALYSIS, MICROSCOPIC - CHCC
Bilirubin (Urine): NEGATIVE
Glucose: NEGATIVE g/dL
Ketones: NEGATIVE mg/dL

## 2010-10-11 LAB — URINE MICROSCOPIC-ADD ON

## 2010-10-11 LAB — CBC
MCH: 30 pg (ref 26.0–34.0)
Platelets: 207 10*3/uL (ref 150–400)
RBC: 4.3 MIL/uL (ref 3.87–5.11)
RDW: 12.7 % (ref 11.5–15.5)
WBC: 14.5 10*3/uL — ABNORMAL HIGH (ref 4.0–10.5)

## 2010-10-11 LAB — URINALYSIS, ROUTINE W REFLEX MICROSCOPIC
Glucose, UA: NEGATIVE mg/dL
Ketones, ur: NEGATIVE mg/dL
Leukocytes, UA: NEGATIVE
Nitrite: NEGATIVE
Protein, ur: NEGATIVE mg/dL

## 2010-10-11 LAB — DIFFERENTIAL
Basophils Relative: 0 % (ref 0–1)
Eosinophils Absolute: 0 10*3/uL (ref 0.0–0.7)
Eosinophils Relative: 0 % (ref 0–5)
Neutrophils Relative %: 86 % — ABNORMAL HIGH (ref 43–77)

## 2010-10-11 LAB — BASIC METABOLIC PANEL
CO2: 23 mEq/L (ref 19–32)
Calcium: 8.3 mg/dL — ABNORMAL LOW (ref 8.4–10.5)
Chloride: 105 mEq/L (ref 96–112)
Creatinine, Ser: 0.99 mg/dL (ref 0.50–1.10)
GFR calc Af Amer: >60 mL/min (ref >60)
Sodium: 137 mEq/L (ref 135–145)

## 2010-10-11 MED ORDER — IOHEXOL 300 MG/ML  SOLN
100.0000 mL | Freq: Once | INTRAMUSCULAR | Status: AC | PRN
  Administered 2010-10-11: 100 mL via INTRAVENOUS

## 2010-10-13 LAB — URINE CULTURE
Colony Count: 4000
Culture  Setup Time: 201207040124

## 2010-10-14 ENCOUNTER — Encounter (HOSPITAL_BASED_OUTPATIENT_CLINIC_OR_DEPARTMENT_OTHER): Payer: BC Managed Care – PPO | Admitting: Oncology

## 2010-10-14 DIAGNOSIS — Z5111 Encounter for antineoplastic chemotherapy: Secondary | ICD-10-CM

## 2010-10-14 DIAGNOSIS — C50919 Malignant neoplasm of unspecified site of unspecified female breast: Secondary | ICD-10-CM

## 2010-10-14 DIAGNOSIS — G893 Neoplasm related pain (acute) (chronic): Secondary | ICD-10-CM

## 2010-10-21 ENCOUNTER — Encounter: Payer: BC Managed Care – PPO | Admitting: Oncology

## 2010-10-31 ENCOUNTER — Encounter (HOSPITAL_BASED_OUTPATIENT_CLINIC_OR_DEPARTMENT_OTHER): Payer: BC Managed Care – PPO | Admitting: Oncology

## 2010-10-31 ENCOUNTER — Other Ambulatory Visit: Payer: Self-pay | Admitting: Oncology

## 2010-10-31 DIAGNOSIS — Z5111 Encounter for antineoplastic chemotherapy: Secondary | ICD-10-CM

## 2010-10-31 DIAGNOSIS — C50919 Malignant neoplasm of unspecified site of unspecified female breast: Secondary | ICD-10-CM

## 2010-10-31 DIAGNOSIS — Z5112 Encounter for antineoplastic immunotherapy: Secondary | ICD-10-CM

## 2010-10-31 LAB — COMPREHENSIVE METABOLIC PANEL
ALT: 19 U/L (ref 0–35)
AST: 21 U/L (ref 0–37)
Albumin: 4.1 g/dL (ref 3.5–5.2)
Alkaline Phosphatase: 90 U/L (ref 39–117)
Potassium: 3.7 mEq/L (ref 3.5–5.3)
Sodium: 140 mEq/L (ref 135–145)
Total Bilirubin: 0.7 mg/dL (ref 0.3–1.2)
Total Protein: 7.5 g/dL (ref 6.0–8.3)

## 2010-10-31 LAB — CBC WITH DIFFERENTIAL/PLATELET
Basophils Absolute: 0 10*3/uL (ref 0.0–0.1)
EOS%: 2.6 % (ref 0.0–7.0)
HCT: 42 % (ref 34.8–46.6)
HGB: 14.9 g/dL (ref 11.6–15.9)
LYMPH%: 26 % (ref 14.0–49.7)
MCH: 31.6 pg (ref 25.1–34.0)
MCHC: 35.4 g/dL (ref 31.5–36.0)
NEUT%: 62.1 % (ref 38.4–76.8)
Platelets: 303 10*3/uL (ref 145–400)
lymph#: 1.9 10*3/uL (ref 0.9–3.3)

## 2010-10-31 LAB — CANCER ANTIGEN 27.29: CA 27.29: 50 U/mL — ABNORMAL HIGH (ref 0–39)

## 2010-11-01 ENCOUNTER — Encounter (HOSPITAL_BASED_OUTPATIENT_CLINIC_OR_DEPARTMENT_OTHER): Payer: BC Managed Care – PPO | Admitting: Oncology

## 2010-11-01 DIAGNOSIS — C7951 Secondary malignant neoplasm of bone: Secondary | ICD-10-CM

## 2010-11-01 DIAGNOSIS — Z5111 Encounter for antineoplastic chemotherapy: Secondary | ICD-10-CM

## 2010-11-01 DIAGNOSIS — C50919 Malignant neoplasm of unspecified site of unspecified female breast: Secondary | ICD-10-CM

## 2010-11-04 ENCOUNTER — Encounter (HOSPITAL_BASED_OUTPATIENT_CLINIC_OR_DEPARTMENT_OTHER): Payer: BC Managed Care – PPO | Admitting: Oncology

## 2010-11-04 DIAGNOSIS — Z5112 Encounter for antineoplastic immunotherapy: Secondary | ICD-10-CM

## 2010-11-04 DIAGNOSIS — C7951 Secondary malignant neoplasm of bone: Secondary | ICD-10-CM

## 2010-11-04 DIAGNOSIS — C50919 Malignant neoplasm of unspecified site of unspecified female breast: Secondary | ICD-10-CM

## 2010-11-11 ENCOUNTER — Encounter (HOSPITAL_BASED_OUTPATIENT_CLINIC_OR_DEPARTMENT_OTHER): Payer: BC Managed Care – PPO | Admitting: Oncology

## 2010-11-11 ENCOUNTER — Other Ambulatory Visit: Payer: Self-pay | Admitting: Physician Assistant

## 2010-11-11 DIAGNOSIS — C50919 Malignant neoplasm of unspecified site of unspecified female breast: Secondary | ICD-10-CM

## 2010-11-11 DIAGNOSIS — Z5111 Encounter for antineoplastic chemotherapy: Secondary | ICD-10-CM

## 2010-11-11 DIAGNOSIS — Z5112 Encounter for antineoplastic immunotherapy: Secondary | ICD-10-CM

## 2010-11-11 LAB — CBC WITH DIFFERENTIAL/PLATELET
BASO%: 0.3 % (ref 0.0–2.0)
Basophils Absolute: 0 10*3/uL (ref 0.0–0.1)
EOS%: 1.7 % (ref 0.0–7.0)
HCT: 38.8 % (ref 34.8–46.6)
HGB: 13.6 g/dL (ref 11.6–15.9)
MCH: 31.4 pg (ref 25.1–34.0)
MCHC: 35.1 g/dL (ref 31.5–36.0)
MCV: 89.6 fL (ref 79.5–101.0)
MONO%: 6.4 % (ref 0.0–14.0)
NEUT%: 68.7 % (ref 38.4–76.8)
RDW: 13 % (ref 11.2–14.5)

## 2010-11-11 LAB — COMPREHENSIVE METABOLIC PANEL
AST: 26 U/L (ref 0–37)
Alkaline Phosphatase: 62 U/L (ref 39–117)
BUN: 14 mg/dL (ref 6–23)
Creatinine, Ser: 0.98 mg/dL (ref 0.50–1.10)

## 2010-11-15 ENCOUNTER — Other Ambulatory Visit (HOSPITAL_COMMUNITY): Payer: Self-pay | Admitting: Oncology

## 2010-11-15 DIAGNOSIS — Z01818 Encounter for other preprocedural examination: Secondary | ICD-10-CM

## 2010-11-15 DIAGNOSIS — C7951 Secondary malignant neoplasm of bone: Secondary | ICD-10-CM

## 2010-11-17 ENCOUNTER — Other Ambulatory Visit (HOSPITAL_COMMUNITY): Payer: BC Managed Care – PPO | Admitting: Radiology

## 2010-11-18 ENCOUNTER — Other Ambulatory Visit: Payer: Self-pay | Admitting: Physician Assistant

## 2010-11-18 ENCOUNTER — Encounter (HOSPITAL_BASED_OUTPATIENT_CLINIC_OR_DEPARTMENT_OTHER): Payer: BC Managed Care – PPO | Admitting: Oncology

## 2010-11-18 DIAGNOSIS — C50919 Malignant neoplasm of unspecified site of unspecified female breast: Secondary | ICD-10-CM

## 2010-11-18 DIAGNOSIS — Z5112 Encounter for antineoplastic immunotherapy: Secondary | ICD-10-CM

## 2010-11-18 LAB — COMPREHENSIVE METABOLIC PANEL
AST: 24 U/L (ref 0–37)
BUN: 17 mg/dL (ref 6–23)
Calcium: 9.5 mg/dL (ref 8.4–10.5)
Chloride: 106 mEq/L (ref 96–112)
Creatinine, Ser: 1.03 mg/dL (ref 0.50–1.10)

## 2010-11-18 LAB — CBC WITH DIFFERENTIAL/PLATELET
Basophils Absolute: 0 10*3/uL (ref 0.0–0.1)
EOS%: 3.2 % (ref 0.0–7.0)
HCT: 40 % (ref 34.8–46.6)
HGB: 13.9 g/dL (ref 11.6–15.9)
LYMPH%: 23.9 % (ref 14.0–49.7)
MCH: 31.5 pg (ref 25.1–34.0)
MCV: 90.9 fL (ref 79.5–101.0)
MONO%: 8.9 % (ref 0.0–14.0)
NEUT%: 63.7 % (ref 38.4–76.8)
Platelets: 215 10*3/uL (ref 145–400)

## 2010-11-21 ENCOUNTER — Ambulatory Visit (HOSPITAL_COMMUNITY): Payer: BC Managed Care – PPO | Admitting: Radiology

## 2010-11-25 ENCOUNTER — Encounter (HOSPITAL_BASED_OUTPATIENT_CLINIC_OR_DEPARTMENT_OTHER): Payer: BC Managed Care – PPO | Admitting: Oncology

## 2010-11-25 ENCOUNTER — Other Ambulatory Visit: Payer: Self-pay | Admitting: Oncology

## 2010-11-25 DIAGNOSIS — Z5111 Encounter for antineoplastic chemotherapy: Secondary | ICD-10-CM

## 2010-11-25 DIAGNOSIS — C50919 Malignant neoplasm of unspecified site of unspecified female breast: Secondary | ICD-10-CM

## 2010-11-25 DIAGNOSIS — Z5112 Encounter for antineoplastic immunotherapy: Secondary | ICD-10-CM

## 2010-11-25 DIAGNOSIS — G893 Neoplasm related pain (acute) (chronic): Secondary | ICD-10-CM

## 2010-11-25 LAB — COMPREHENSIVE METABOLIC PANEL
BUN: 16 mg/dL (ref 6–23)
CO2: 23 mEq/L (ref 19–32)
Calcium: 9.4 mg/dL (ref 8.4–10.5)
Chloride: 106 mEq/L (ref 96–112)
Creatinine, Ser: 0.99 mg/dL (ref 0.50–1.10)

## 2010-11-25 LAB — CBC WITH DIFFERENTIAL/PLATELET
Basophils Absolute: 0 10*3/uL (ref 0.0–0.1)
EOS%: 2.5 % (ref 0.0–7.0)
HCT: 39.7 % (ref 34.8–46.6)
HGB: 13.9 g/dL (ref 11.6–15.9)
MCH: 31.8 pg (ref 25.1–34.0)
MONO#: 0.5 10*3/uL (ref 0.1–0.9)
NEUT%: 57.4 % (ref 38.4–76.8)
lymph#: 1.4 10*3/uL (ref 0.9–3.3)

## 2010-11-25 LAB — CANCER ANTIGEN 27.29: CA 27.29: 54 U/mL — ABNORMAL HIGH (ref 0–39)

## 2010-11-25 LAB — LACTATE DEHYDROGENASE: LDH: 217 U/L (ref 94–250)

## 2010-11-29 ENCOUNTER — Ambulatory Visit (HOSPITAL_COMMUNITY)
Admission: RE | Admit: 2010-11-29 | Discharge: 2010-11-29 | Disposition: A | Discharge status: Home / Self Care | Payer: BC Managed Care – PPO | Source: Ambulatory Visit | Attending: Oncology | Admitting: Oncology

## 2010-11-29 DIAGNOSIS — Z01818 Encounter for other preprocedural examination: Secondary | ICD-10-CM

## 2010-11-29 DIAGNOSIS — C50919 Malignant neoplasm of unspecified site of unspecified female breast: Secondary | ICD-10-CM

## 2010-11-29 DIAGNOSIS — Z9221 Personal history of antineoplastic chemotherapy: Secondary | ICD-10-CM | POA: Insufficient documentation

## 2010-12-02 ENCOUNTER — Encounter (HOSPITAL_BASED_OUTPATIENT_CLINIC_OR_DEPARTMENT_OTHER): Payer: BC Managed Care – PPO | Admitting: Oncology

## 2010-12-02 DIAGNOSIS — C50919 Malignant neoplasm of unspecified site of unspecified female breast: Secondary | ICD-10-CM

## 2010-12-02 DIAGNOSIS — Z5111 Encounter for antineoplastic chemotherapy: Secondary | ICD-10-CM

## 2010-12-09 ENCOUNTER — Encounter (HOSPITAL_BASED_OUTPATIENT_CLINIC_OR_DEPARTMENT_OTHER): Payer: BC Managed Care – PPO | Admitting: Oncology

## 2010-12-09 DIAGNOSIS — C50919 Malignant neoplasm of unspecified site of unspecified female breast: Secondary | ICD-10-CM

## 2010-12-09 DIAGNOSIS — Z5112 Encounter for antineoplastic immunotherapy: Secondary | ICD-10-CM

## 2010-12-13 ENCOUNTER — Other Ambulatory Visit: Payer: Self-pay | Admitting: Oncology

## 2010-12-13 DIAGNOSIS — C50919 Malignant neoplasm of unspecified site of unspecified female breast: Secondary | ICD-10-CM

## 2010-12-16 ENCOUNTER — Other Ambulatory Visit: Payer: Self-pay | Admitting: Oncology

## 2010-12-16 ENCOUNTER — Encounter (HOSPITAL_BASED_OUTPATIENT_CLINIC_OR_DEPARTMENT_OTHER): Payer: BC Managed Care – PPO | Admitting: Oncology

## 2010-12-16 DIAGNOSIS — Z5112 Encounter for antineoplastic immunotherapy: Secondary | ICD-10-CM

## 2010-12-16 DIAGNOSIS — Z5111 Encounter for antineoplastic chemotherapy: Secondary | ICD-10-CM

## 2010-12-16 DIAGNOSIS — C50919 Malignant neoplasm of unspecified site of unspecified female breast: Secondary | ICD-10-CM

## 2010-12-16 LAB — CBC WITH DIFFERENTIAL/PLATELET
Basophils Absolute: 0 10*3/uL (ref 0.0–0.1)
EOS%: 1.7 % (ref 0.0–7.0)
Eosinophils Absolute: 0.1 10*3/uL (ref 0.0–0.5)
HGB: 13.5 g/dL (ref 11.6–15.9)
LYMPH%: 21.6 % (ref 14.0–49.7)
MCH: 31.5 pg (ref 25.1–34.0)
MCV: 91.1 fL (ref 79.5–101.0)
MONO%: 7.1 % (ref 0.0–14.0)
NEUT#: 4.6 10*3/uL (ref 1.5–6.5)
Platelets: 262 10*3/uL (ref 145–400)

## 2010-12-16 LAB — COMPREHENSIVE METABOLIC PANEL
AST: 27 U/L (ref 0–37)
Alkaline Phosphatase: 70 U/L (ref 39–117)
BUN: 11 mg/dL (ref 6–23)
Creatinine, Ser: 0.91 mg/dL (ref 0.50–1.10)
Glucose, Bld: 110 mg/dL — ABNORMAL HIGH (ref 70–99)
Total Bilirubin: 0.6 mg/dL (ref 0.3–1.2)

## 2010-12-20 ENCOUNTER — Encounter (HOSPITAL_COMMUNITY): Payer: Self-pay

## 2010-12-20 ENCOUNTER — Encounter (HOSPITAL_COMMUNITY)
Admission: RE | Admit: 2010-12-20 | Discharge: 2010-12-20 | Disposition: A | Discharge status: Home / Self Care | Payer: BC Managed Care – PPO | Source: Ambulatory Visit | Attending: Oncology | Admitting: Oncology

## 2010-12-20 DIAGNOSIS — C7951 Secondary malignant neoplasm of bone: Secondary | ICD-10-CM | POA: Insufficient documentation

## 2010-12-20 DIAGNOSIS — C50919 Malignant neoplasm of unspecified site of unspecified female breast: Secondary | ICD-10-CM

## 2010-12-20 DIAGNOSIS — C7900 Secondary malignant neoplasm of unspecified kidney and renal pelvis: Secondary | ICD-10-CM | POA: Insufficient documentation

## 2010-12-20 DIAGNOSIS — C787 Secondary malignant neoplasm of liver and intrahepatic bile duct: Secondary | ICD-10-CM | POA: Insufficient documentation

## 2010-12-20 LAB — GLUCOSE, CAPILLARY: Glucose-Capillary: 96 mg/dL (ref 70–99)

## 2010-12-20 MED ORDER — IOHEXOL 300 MG/ML  SOLN
100.0000 mL | Freq: Once | INTRAMUSCULAR | Status: AC | PRN
  Administered 2010-12-20: 100 mL via INTRAVENOUS

## 2010-12-20 MED ORDER — FLUDEOXYGLUCOSE F - 18 (FDG) INJECTION
15.5000 | Freq: Once | INTRAVENOUS | Status: AC | PRN
  Administered 2010-12-20: 15.5 via INTRAVENOUS

## 2010-12-21 LAB — FOLLICLE STIMULATING HORMONE: FSH: 7.2 m[IU]/mL

## 2010-12-23 ENCOUNTER — Encounter (HOSPITAL_BASED_OUTPATIENT_CLINIC_OR_DEPARTMENT_OTHER): Payer: BC Managed Care – PPO | Admitting: Oncology

## 2010-12-23 DIAGNOSIS — C50919 Malignant neoplasm of unspecified site of unspecified female breast: Secondary | ICD-10-CM

## 2010-12-23 DIAGNOSIS — Z5112 Encounter for antineoplastic immunotherapy: Secondary | ICD-10-CM

## 2010-12-23 DIAGNOSIS — C7951 Secondary malignant neoplasm of bone: Secondary | ICD-10-CM

## 2010-12-23 DIAGNOSIS — Z5111 Encounter for antineoplastic chemotherapy: Secondary | ICD-10-CM

## 2011-01-03 LAB — URINE MICROSCOPIC-ADD ON

## 2011-01-03 LAB — URINALYSIS, ROUTINE W REFLEX MICROSCOPIC
Glucose, UA: NEGATIVE
Protein, ur: NEGATIVE
pH: 6

## 2011-01-03 LAB — BASIC METABOLIC PANEL
Calcium: 9.5
Creatinine, Ser: 0.91
GFR calc Af Amer: >60
GFR calc non Af Amer: >60
Glucose, Bld: 95
Sodium: 143

## 2011-01-03 LAB — HEMOGLOBIN AND HEMATOCRIT, BLOOD
HCT: 39.5
Hemoglobin: 14

## 2011-01-03 LAB — CBC
Hemoglobin: 14.2
Platelets: 196
RDW: 12.3

## 2011-01-06 ENCOUNTER — Encounter (HOSPITAL_BASED_OUTPATIENT_CLINIC_OR_DEPARTMENT_OTHER): Payer: BC Managed Care – PPO | Admitting: Oncology

## 2011-01-06 ENCOUNTER — Other Ambulatory Visit: Payer: Self-pay | Admitting: Oncology

## 2011-01-06 DIAGNOSIS — C7951 Secondary malignant neoplasm of bone: Secondary | ICD-10-CM

## 2011-01-06 DIAGNOSIS — Z5111 Encounter for antineoplastic chemotherapy: Secondary | ICD-10-CM

## 2011-01-06 DIAGNOSIS — C50919 Malignant neoplasm of unspecified site of unspecified female breast: Secondary | ICD-10-CM

## 2011-01-06 DIAGNOSIS — Z5112 Encounter for antineoplastic immunotherapy: Secondary | ICD-10-CM

## 2011-01-06 LAB — CBC WITH DIFFERENTIAL/PLATELET
BASO%: 0.5 % (ref 0.0–2.0)
EOS%: 2.5 % (ref 0.0–7.0)
HCT: 39.4 % (ref 34.8–46.6)
MCHC: 34.8 g/dL (ref 31.5–36.0)
MONO#: 0.5 10*3/uL (ref 0.1–0.9)
RBC: 4.49 10*6/uL (ref 3.70–5.45)
RDW: 12.5 % (ref 11.2–14.5)
WBC: 5.6 10*3/uL (ref 3.9–10.3)
lymph#: 1.2 10*3/uL (ref 0.9–3.3)
nRBC: 0 % (ref 0–0)

## 2011-01-06 LAB — COMPREHENSIVE METABOLIC PANEL
ALT: 26 U/L (ref 0–35)
AST: 26 U/L (ref 0–37)
Albumin: 3.9 g/dL (ref 3.5–5.2)
Alkaline Phosphatase: 68 U/L (ref 39–117)
Calcium: 8.8 mg/dL (ref 8.4–10.5)
Chloride: 110 mEq/L (ref 96–112)
Potassium: 3.6 mEq/L (ref 3.5–5.3)
Sodium: 144 mEq/L (ref 135–145)

## 2011-01-13 ENCOUNTER — Other Ambulatory Visit: Payer: Self-pay | Admitting: Oncology

## 2011-01-13 ENCOUNTER — Encounter (HOSPITAL_BASED_OUTPATIENT_CLINIC_OR_DEPARTMENT_OTHER): Payer: BC Managed Care – PPO | Admitting: Oncology

## 2011-01-13 DIAGNOSIS — C7952 Secondary malignant neoplasm of bone marrow: Secondary | ICD-10-CM

## 2011-01-13 DIAGNOSIS — Z5112 Encounter for antineoplastic immunotherapy: Secondary | ICD-10-CM

## 2011-01-13 DIAGNOSIS — C50919 Malignant neoplasm of unspecified site of unspecified female breast: Secondary | ICD-10-CM

## 2011-01-13 DIAGNOSIS — Z5111 Encounter for antineoplastic chemotherapy: Secondary | ICD-10-CM

## 2011-01-13 LAB — COMPREHENSIVE METABOLIC PANEL
Alkaline Phosphatase: 68 U/L (ref 39–117)
CO2: 24 mEq/L (ref 19–32)
Creatinine, Ser: 0.8 mg/dL (ref 0.50–1.10)
Glucose, Bld: 92 mg/dL (ref 70–99)
Sodium: 142 mEq/L (ref 135–145)
Total Bilirubin: 0.8 mg/dL (ref 0.3–1.2)
Total Protein: 6.4 g/dL (ref 6.0–8.3)

## 2011-01-13 LAB — LACTATE DEHYDROGENASE: LDH: 284 U/L — ABNORMAL HIGH (ref 94–250)

## 2011-01-13 LAB — CBC WITH DIFFERENTIAL/PLATELET
Basophils Absolute: 0 10*3/uL (ref 0.0–0.1)
EOS%: 2 % (ref 0.0–7.0)
Eosinophils Absolute: 0.1 10*3/uL (ref 0.0–0.5)
LYMPH%: 23.5 % (ref 14.0–49.7)
MCH: 30.5 pg (ref 25.1–34.0)
MCV: 87.7 fL (ref 79.5–101.0)
MONO%: 8.8 % (ref 0.0–14.0)
Platelets: 224 10*3/uL (ref 145–400)
RBC: 4.23 10*6/uL (ref 3.70–5.45)
RDW: 12.7 % (ref 11.2–14.5)
nRBC: 0 % (ref 0–0)

## 2011-01-13 LAB — CANCER ANTIGEN 27.29: CA 27.29: 88 U/mL — ABNORMAL HIGH (ref 0–39)

## 2011-01-20 ENCOUNTER — Other Ambulatory Visit: Payer: Self-pay | Admitting: Oncology

## 2011-01-20 ENCOUNTER — Encounter (HOSPITAL_BASED_OUTPATIENT_CLINIC_OR_DEPARTMENT_OTHER): Payer: BC Managed Care – PPO | Admitting: Oncology

## 2011-01-20 DIAGNOSIS — Z5111 Encounter for antineoplastic chemotherapy: Secondary | ICD-10-CM

## 2011-01-20 DIAGNOSIS — C50919 Malignant neoplasm of unspecified site of unspecified female breast: Secondary | ICD-10-CM

## 2011-01-20 DIAGNOSIS — Z5112 Encounter for antineoplastic immunotherapy: Secondary | ICD-10-CM

## 2011-01-20 DIAGNOSIS — C7951 Secondary malignant neoplasm of bone: Secondary | ICD-10-CM

## 2011-01-20 LAB — CBC WITH DIFFERENTIAL/PLATELET
Basophils Absolute: 0.1 10*3/uL (ref 0.0–0.1)
EOS%: 1.7 % (ref 0.0–7.0)
Eosinophils Absolute: 0.1 10*3/uL (ref 0.0–0.5)
HCT: 45.5 % (ref 34.8–46.6)
HGB: 15.8 g/dL (ref 11.6–15.9)
MONO#: 0.6 10*3/uL (ref 0.1–0.9)
NEUT#: 5.5 10*3/uL (ref 1.5–6.5)
NEUT%: 70.8 % (ref 38.4–76.8)
RDW: 13 % (ref 11.2–14.5)
lymph#: 1.4 10*3/uL (ref 0.9–3.3)

## 2011-01-20 LAB — COMPREHENSIVE METABOLIC PANEL
AST: 25 U/L (ref 0–37)
Albumin: 4.6 g/dL (ref 3.5–5.2)
BUN: 23 mg/dL (ref 6–23)
CO2: 18 mEq/L — ABNORMAL LOW (ref 19–32)
Calcium: 9.6 mg/dL (ref 8.4–10.5)
Chloride: 107 mEq/L (ref 96–112)
Creatinine, Ser: 1.19 mg/dL — ABNORMAL HIGH (ref 0.50–1.10)
Glucose, Bld: 108 mg/dL — ABNORMAL HIGH (ref 70–99)
Potassium: 3.7 mEq/L (ref 3.5–5.3)

## 2011-01-23 ENCOUNTER — Other Ambulatory Visit: Payer: Self-pay | Admitting: Oncology

## 2011-01-23 ENCOUNTER — Ambulatory Visit (HOSPITAL_COMMUNITY)
Admission: RE | Admit: 2011-01-23 | Discharge: 2011-01-23 | Disposition: A | Discharge status: Home / Self Care | Payer: BC Managed Care – PPO | Source: Ambulatory Visit | Attending: Oncology | Admitting: Oncology

## 2011-01-23 ENCOUNTER — Encounter (HOSPITAL_BASED_OUTPATIENT_CLINIC_OR_DEPARTMENT_OTHER): Payer: BC Managed Care – PPO | Admitting: Oncology

## 2011-01-23 DIAGNOSIS — C7951 Secondary malignant neoplasm of bone: Secondary | ICD-10-CM

## 2011-01-23 DIAGNOSIS — Z9010 Acquired absence of unspecified breast and nipple: Secondary | ICD-10-CM | POA: Insufficient documentation

## 2011-01-23 DIAGNOSIS — R059 Cough, unspecified: Secondary | ICD-10-CM | POA: Insufficient documentation

## 2011-01-23 DIAGNOSIS — R05 Cough: Secondary | ICD-10-CM | POA: Insufficient documentation

## 2011-01-23 DIAGNOSIS — C50919 Malignant neoplasm of unspecified site of unspecified female breast: Secondary | ICD-10-CM

## 2011-01-23 DIAGNOSIS — Z5111 Encounter for antineoplastic chemotherapy: Secondary | ICD-10-CM

## 2011-01-23 LAB — COMPREHENSIVE METABOLIC PANEL
AST: 24 U/L (ref 0–37)
Alkaline Phosphatase: 90 U/L (ref 39–117)
BUN: 24 mg/dL — ABNORMAL HIGH (ref 6–23)
Creatinine, Ser: 1.08 mg/dL (ref 0.50–1.10)
Glucose, Bld: 118 mg/dL — ABNORMAL HIGH (ref 70–99)
Potassium: 2.6 mEq/L — CL (ref 3.5–5.3)
Total Bilirubin: 0.7 mg/dL (ref 0.3–1.2)

## 2011-01-24 ENCOUNTER — Other Ambulatory Visit: Payer: Self-pay | Admitting: Oncology

## 2011-01-24 ENCOUNTER — Encounter (HOSPITAL_BASED_OUTPATIENT_CLINIC_OR_DEPARTMENT_OTHER): Payer: BC Managed Care – PPO | Admitting: Oncology

## 2011-01-24 DIAGNOSIS — Z5111 Encounter for antineoplastic chemotherapy: Secondary | ICD-10-CM

## 2011-01-24 DIAGNOSIS — C7951 Secondary malignant neoplasm of bone: Secondary | ICD-10-CM

## 2011-01-24 DIAGNOSIS — C50919 Malignant neoplasm of unspecified site of unspecified female breast: Secondary | ICD-10-CM

## 2011-01-24 LAB — BASIC METABOLIC PANEL
BUN: 19 mg/dL (ref 6–23)
CO2: 23 mEq/L (ref 19–32)
Chloride: 104 mEq/L (ref 96–112)
Glucose, Bld: 96 mg/dL (ref 70–99)
Potassium: 2.7 mEq/L — CL (ref 3.5–5.3)
Sodium: 141 mEq/L (ref 135–145)

## 2011-01-25 ENCOUNTER — Encounter: Payer: Self-pay | Admitting: *Deleted

## 2011-01-27 ENCOUNTER — Encounter (HOSPITAL_BASED_OUTPATIENT_CLINIC_OR_DEPARTMENT_OTHER): Payer: BC Managed Care – PPO | Admitting: Oncology

## 2011-01-27 ENCOUNTER — Other Ambulatory Visit: Payer: Self-pay | Admitting: Oncology

## 2011-01-27 ENCOUNTER — Observation Stay (HOSPITAL_COMMUNITY)
Admission: RE | Admit: 2011-01-27 | Discharge: 2011-01-28 | Disposition: A | Discharge status: Home / Self Care | Payer: BC Managed Care – PPO | Source: Ambulatory Visit | Attending: Oncology | Admitting: Oncology

## 2011-01-27 DIAGNOSIS — C7952 Secondary malignant neoplasm of bone marrow: Secondary | ICD-10-CM

## 2011-01-27 DIAGNOSIS — E876 Hypokalemia: Principal | ICD-10-CM | POA: Insufficient documentation

## 2011-01-27 DIAGNOSIS — C50919 Malignant neoplasm of unspecified site of unspecified female breast: Secondary | ICD-10-CM

## 2011-01-27 DIAGNOSIS — Z5112 Encounter for antineoplastic immunotherapy: Secondary | ICD-10-CM

## 2011-01-27 DIAGNOSIS — Z79899 Other long term (current) drug therapy: Secondary | ICD-10-CM | POA: Insufficient documentation

## 2011-01-27 DIAGNOSIS — C7951 Secondary malignant neoplasm of bone: Secondary | ICD-10-CM

## 2011-01-27 LAB — BASIC METABOLIC PANEL
BUN: 20 mg/dL (ref 6–23)
CO2: 19 mEq/L (ref 19–32)
Calcium: 9.6 mg/dL (ref 8.4–10.5)
Glucose, Bld: 107 mg/dL — ABNORMAL HIGH (ref 70–99)

## 2011-01-28 LAB — CORTISOL: Cortisol, Plasma: 19.6 ug/dL

## 2011-01-28 LAB — BASIC METABOLIC PANEL
Calcium: 8.8 mg/dL (ref 8.4–10.5)
Creatinine, Ser: 0.84 mg/dL (ref 0.50–1.10)
GFR calc non Af Amer: 85 mL/min — ABNORMAL LOW (ref >90)
Sodium: 139 mEq/L (ref 135–145)

## 2011-01-28 LAB — MAGNESIUM: Magnesium: 1.8 mg/dL (ref 1.5–2.5)

## 2011-01-29 DIAGNOSIS — C50919 Malignant neoplasm of unspecified site of unspecified female breast: Secondary | ICD-10-CM

## 2011-01-30 ENCOUNTER — Other Ambulatory Visit: Payer: Self-pay | Admitting: Oncology

## 2011-01-30 LAB — BASIC METABOLIC PANEL
BUN: 10 mg/dL (ref 6–23)
CO2: 22 mEq/L (ref 19–32)
Calcium: 9.4 mg/dL (ref 8.4–10.5)
Glucose, Bld: 109 mg/dL — ABNORMAL HIGH (ref 70–99)
Potassium: 3.2 mEq/L — ABNORMAL LOW (ref 3.5–5.3)
Sodium: 140 mEq/L (ref 135–145)

## 2011-01-31 LAB — ACTH: C206 ACTH: 18 pg/mL (ref 10–46)

## 2011-02-03 ENCOUNTER — Encounter (HOSPITAL_BASED_OUTPATIENT_CLINIC_OR_DEPARTMENT_OTHER): Payer: BC Managed Care – PPO | Admitting: Oncology

## 2011-02-03 ENCOUNTER — Other Ambulatory Visit: Payer: Self-pay | Admitting: Oncology

## 2011-02-03 DIAGNOSIS — C50919 Malignant neoplasm of unspecified site of unspecified female breast: Secondary | ICD-10-CM

## 2011-02-03 DIAGNOSIS — Z5111 Encounter for antineoplastic chemotherapy: Secondary | ICD-10-CM

## 2011-02-03 DIAGNOSIS — C7952 Secondary malignant neoplasm of bone marrow: Secondary | ICD-10-CM

## 2011-02-03 DIAGNOSIS — Z5112 Encounter for antineoplastic immunotherapy: Secondary | ICD-10-CM

## 2011-02-03 LAB — CBC WITH DIFFERENTIAL/PLATELET
Basophils Absolute: 0 10*3/uL (ref 0.0–0.1)
EOS%: 2.6 % (ref 0.0–7.0)
Eosinophils Absolute: 0.1 10*3/uL (ref 0.0–0.5)
HGB: 13.3 g/dL (ref 11.6–15.9)
LYMPH%: 28.8 % (ref 14.0–49.7)
MCH: 31.6 pg (ref 25.1–34.0)
MCV: 89.9 fL (ref 79.5–101.0)
MONO%: 8.9 % (ref 0.0–14.0)
NEUT#: 2.7 10*3/uL (ref 1.5–6.5)
Platelets: 202 10*3/uL (ref 145–400)
RDW: 12.7 % (ref 11.2–14.5)

## 2011-02-03 LAB — COMPREHENSIVE METABOLIC PANEL
AST: 21 U/L (ref 0–37)
Alkaline Phosphatase: 59 U/L (ref 39–117)
BUN: 9 mg/dL (ref 6–23)
Creatinine, Ser: 0.8 mg/dL (ref 0.50–1.10)
Glucose, Bld: 98 mg/dL (ref 70–99)
Total Bilirubin: 0.3 mg/dL (ref 0.3–1.2)

## 2011-02-03 LAB — CANCER ANTIGEN 27.29: CA 27.29: 80 U/mL — ABNORMAL HIGH (ref 0–39)

## 2011-02-10 ENCOUNTER — Encounter (HOSPITAL_BASED_OUTPATIENT_CLINIC_OR_DEPARTMENT_OTHER): Payer: BC Managed Care – PPO | Admitting: Oncology

## 2011-02-10 DIAGNOSIS — C7952 Secondary malignant neoplasm of bone marrow: Secondary | ICD-10-CM

## 2011-02-10 DIAGNOSIS — Z5112 Encounter for antineoplastic immunotherapy: Secondary | ICD-10-CM

## 2011-02-10 DIAGNOSIS — C50919 Malignant neoplasm of unspecified site of unspecified female breast: Secondary | ICD-10-CM

## 2011-02-15 ENCOUNTER — Ambulatory Visit: Payer: BC Managed Care – PPO

## 2011-02-16 ENCOUNTER — Other Ambulatory Visit: Payer: Self-pay | Admitting: Oncology

## 2011-02-16 ENCOUNTER — Telehealth: Payer: Self-pay | Admitting: *Deleted

## 2011-02-16 DIAGNOSIS — C50919 Malignant neoplasm of unspecified site of unspecified female breast: Secondary | ICD-10-CM

## 2011-02-16 NOTE — Telephone Encounter (Signed)
Per dr.rubin requested to see patient at 9:00am and then for chemo treatment to follow called patient on cell phone and patient confirmed over the patient the new start date.

## 2011-02-17 ENCOUNTER — Ambulatory Visit (HOSPITAL_BASED_OUTPATIENT_CLINIC_OR_DEPARTMENT_OTHER): Payer: BC Managed Care – PPO | Admitting: Oncology

## 2011-02-17 ENCOUNTER — Ambulatory Visit: Payer: BC Managed Care – PPO

## 2011-02-17 ENCOUNTER — Telehealth: Payer: Self-pay | Admitting: *Deleted

## 2011-02-17 ENCOUNTER — Ambulatory Visit (HOSPITAL_BASED_OUTPATIENT_CLINIC_OR_DEPARTMENT_OTHER): Payer: BC Managed Care – PPO

## 2011-02-17 VITALS — BP 161/99 | HR 112 | Temp 98.3°F | Ht 64.0 in | Wt 157.5 lb

## 2011-02-17 DIAGNOSIS — Z5112 Encounter for antineoplastic immunotherapy: Secondary | ICD-10-CM

## 2011-02-17 DIAGNOSIS — C50919 Malignant neoplasm of unspecified site of unspecified female breast: Secondary | ICD-10-CM

## 2011-02-17 DIAGNOSIS — C787 Secondary malignant neoplasm of liver and intrahepatic bile duct: Secondary | ICD-10-CM

## 2011-02-17 MED ORDER — SODIUM CHLORIDE 0.9 % IV SOLN
Freq: Once | INTRAVENOUS | Status: AC
  Administered 2011-02-17: 10:00:00 via INTRAVENOUS

## 2011-02-17 MED ORDER — ACETAMINOPHEN 325 MG PO TABS
650.0000 mg | ORAL_TABLET | Freq: Once | ORAL | Status: AC
  Administered 2011-02-17: 650 mg via ORAL

## 2011-02-17 MED ORDER — HEPARIN SOD (PORK) LOCK FLUSH 100 UNIT/ML IV SOLN
500.0000 [IU] | Freq: Once | INTRAVENOUS | Status: AC | PRN
  Administered 2011-02-17: 500 [IU]
  Filled 2011-02-17: qty 5

## 2011-02-17 MED ORDER — SODIUM CHLORIDE 0.9 % IJ SOLN
10.0000 mL | INTRAMUSCULAR | Status: DC | PRN
  Administered 2011-02-17: 10 mL
  Filled 2011-02-17: qty 10

## 2011-02-17 MED ORDER — TRASTUZUMAB CHEMO INJECTION 440 MG
2.0000 mg/kg | Freq: Once | INTRAVENOUS | Status: AC
  Administered 2011-02-17: 147 mg via INTRAVENOUS
  Filled 2011-02-17: qty 7

## 2011-02-17 NOTE — Progress Notes (Signed)
Hematology and Oncology Follow Up Visit  Kathy Jimenez [id number] 01-Jun-1970 40 y.o. 02/17/2011 10:01 AM   Principle Diagnosis: stage 4 her2 + breast cancer on weekly herceptin, perjeta q 4 weeks, xgeva , faslodex and zoladex.  Interim History:  Feeling well, no new complaints, denies h/a, sob or cough.  Medications: I have reviewed the patient's current medications.  Allergies:  Allergies  Allergen Reactions  . Sulfa Antibiotics Hives    Past Medical History, Surgical history, Social history, and Family History were reviewed and updated.  Review of Systems: Constitutional:  Negative for fever, chills, night sweats, anorexia, weight loss, pain. Cardiovascular: no chest pain or dyspnea on exertion Respiratory: no cough, shortness of breath, or wheezing Neurological: negative Dermatological: negative ENT: negative Skin Gastrointestinal: no abdominal pain, change in bowel habits, or black or bloody stools Genito-Urinary: no dysuria, trouble voiding, or hematuria Hematological and Lymphatic: negative Breast: negative for breast lumps Musculoskeletal: negative Remaining ROS negative.  Physical Exam: Blood pressure 161/99, pulse 112, temperature 98.3 F (36.8 C), height 5\' 4"  (1.626 m), weight 157 lb 8 oz (71.442 kg). ECOG: 0 General appearance: alert, cooperative and appears stated age Head: Normocephalic, without obvious abnormality, atraumatic Neck: no adenopathy, no carotid bruit, no JVD, supple, symmetrical, trachea midline and thyroid not enlarged, symmetric, no tenderness/mass/nodules Lymph nodes: Cervical, supraclavicular, and axillary nodes normal. Cardiac : normal Pulmonary:normal Breasts:normal Abdomen:normal Extremities normal Neuro:normal  Lab Results: Lab Results  Component Value Date   WBC 14.5* 10/11/2010   HGB 13.3 02/03/2011   HCT 37.8 02/03/2011   MCV 89.9 02/03/2011   PLT 202 02/03/2011     Chemistry      Component Value Date/Time   NA 143  02/03/2011 0847   NA 143 02/03/2011 0847   NA 141 09/02/2010 1134   K 3.3* 02/03/2011 0847   K 3.3* 02/03/2011 0847   K 4.3 09/02/2010 1134   CL 112 02/03/2011 0847   CL 112 02/03/2011 0847   CL 103 09/02/2010 1134   CO2 23 02/03/2011 0847   CO2 23 02/03/2011 0847   CO2 25 09/02/2010 1134   BUN 9 02/03/2011 0847   BUN 9 02/03/2011 0847   BUN 11 09/02/2010 1134   CREATININE 0.80 02/03/2011 0847   CREATININE 0.80 02/03/2011 0847   CREATININE 0.7 09/02/2010 1134      Component Value Date/Time   CALCIUM 8.7 02/03/2011 0847   CALCIUM 8.7 02/03/2011 0847   CALCIUM 8.4 09/02/2010 1134   ALKPHOS 59 02/03/2011 0847   ALKPHOS 59 02/03/2011 0847   ALKPHOS 84 09/02/2010 1134   AST 21 02/03/2011 0847   AST 21 02/03/2011 0847   AST 27 09/02/2010 1134   ALT 16 02/03/2011 0847   ALT 16 02/03/2011 0847   BILITOT 0.3 02/03/2011 0847   BILITOT 0.3 02/03/2011 0847   BILITOT 0.60 09/02/2010 1134       Radiological Studies: chest X-ray n/a Mammogram n/a Bone density n/a  Impression and Plan: Continues to do well, ca27.29 drifting down, will continue current plan with herceptin today and perjeta next week.  More than 50% of the visit was spent in patient-related counselling   Ngan Qualls, MD 11/9/201210:01 AM

## 2011-02-17 NOTE — Patient Instructions (Signed)
[facility] Discharge Instructions for Patients Receiving Chemotherapy  Today you received the following chemotherapy agents herceptin  To help prevent nausea and vomiting after your treatment, we encourage you to take your nausea medication as prescribed.If you develop nausea and vomiting that is not controlled by your nausea medication, call the clinic. If it is after clinic hours your family physician or the after hours number for the clinic or go to the Emergency Department.   BELOW ARE SYMPTOMS THAT SHOULD BE REPORTED IMMEDIATELY:  *FEVER GREATER THAN 100.5 F  *CHILLS WITH OR WITHOUT FEVER  NAUSEA AND VOMITING THAT IS NOT CONTROLLED WITH YOUR NAUSEA MEDICATION  *UNUSUAL SHORTNESS OF BREATH  *UNUSUAL BRUISING OR BLEEDING  TENDERNESS IN MOUTH AND THROAT WITH OR WITHOUT PRESENCE OF ULCERS  *URINARY PROBLEMS  *BOWEL PROBLEMS  UNUSUAL RASH Items with * indicate a potential emergency and should be followed up as soon as possible.  One of the nurses will contact you 24 hours after your treatment. Please let the nurse know about any problems that you may have experienced. Feel free to call the clinic you have any questions or concerns. The clinic phone number is [phone].   I have been informed and understand all the instructions given to me. I know to contact the clinic, my physician, or go to the Emergency Department if any problems should occur. I do not have any questions at this time, but understand that I may call the clinic during office hours   should I have any questions or need assistance in obtaining follow up care.    __________________________________________  _____________  __________ Signature of Patient or Authorized Representative            Date                   Time    __________________________________________ Nurse's Signature    

## 2011-02-17 NOTE — Telephone Encounter (Signed)
Per the doctor's orders made patient appointment all the way out to 03-25-2011.

## 2011-02-19 ENCOUNTER — Other Ambulatory Visit: Payer: Self-pay | Admitting: Oncology

## 2011-02-19 ENCOUNTER — Encounter: Payer: Self-pay | Admitting: Oncology

## 2011-02-19 NOTE — Progress Notes (Signed)
ADDRESS:  [address] [facility] [place] [address] [address]  ID [id number]  Attention Medical Director Health Care Management of Operations Re:  Kathy Jimenez  BODY:  I am writing regarding Kathy Jimenez's recent admission to hospital from 01/27/2011 to 01/28/2011.  Kathy Jimenez has a history of metastatic breast cancer and is on a number of medications which cause her to become quite hypokalemic.  On the day in question she was noted to have a very low potassium of 2.6.  She did have some symptoms of fatigue and weakness.  EKG was not performed though she did have somewhat of irregular pulse at that time.  As we determined these results at approximately 5:00 on the afternoon on a Friday it was felt that the most efficient way to deal with this was to admit her to the hospital to give her continuous runs of IV potassium throughout the night.  The following day after extensive potassium replacement her potassium had risen to 3.2 and she felt much better and in fact did no longer have an irregular pulse.  Of note, during telemetry she was found to have some PACs.  I hope this provides significant explanation to justify Kathy Jimenez's recent hospitalization.  If you have any other questions, please do not hesitate to contact me.  Sincerely,    ______________________________ Pierce Crane, M.D., F.R.C.P.C. PR/MEDQ  D:  02/19/2011  T:  02/19/2011  Job:  [id number]

## 2011-02-20 NOTE — Letter (Signed)
February 19, 2011  Mailed 02/20/11/bjp  [facility] [employer], Payment Integrity [address] [address] [place], [place]  [address] Attn:  Lyndal Pulley, MD  NAME:  Kathy Jimenez, Kathy Jimenez MRN:  [id number] DOB:  05-May-1970  Reference # [id number]  Dear Dr. Clent Ridges:  I am answering regarding [name]'s denial of a test, specifically the cell enumeration selection and identification of fluid specimens, i.e., cycling tumor cell assay.  This patient had this test initially in April 2012.  At that time she had recently been diagnosed with possible recurrence of her breast cancer.  She had developed an elevated CA27-29 with some suspicious liver lesions.  The test was performed to really correlate with the elevated tumor marker and give further evidence of potential metastatic disease prior to going ahead with liver biopsy. This test has been useful to monitor patient's response to chemotherapy when they do have metastatic disease and as such was important to perform as a new baseline was required.  Once again, there has been sufficient literature which has documented the importance of performing this test as a means as monitoring patients while on chemotherapy.  If you have any other further questions regarding this case, please do not hesitate to call directly.  Sincerely,    Pierce Crane, M.D., F.R.C.P.C.  PR/MEDQ  D:  02/19/2011  T:  02/20/2011  Job:  [id number]

## 2011-02-21 ENCOUNTER — Other Ambulatory Visit: Payer: Self-pay | Admitting: Oncology

## 2011-02-21 DIAGNOSIS — C50919 Malignant neoplasm of unspecified site of unspecified female breast: Secondary | ICD-10-CM

## 2011-02-22 ENCOUNTER — Ambulatory Visit (HOSPITAL_COMMUNITY)
Admission: RE | Admit: 2011-02-22 | Discharge: 2011-02-22 | Disposition: A | Discharge status: Home / Self Care | Payer: BC Managed Care – PPO | Source: Ambulatory Visit | Attending: Oncology | Admitting: Oncology

## 2011-02-22 ENCOUNTER — Other Ambulatory Visit: Payer: Self-pay | Admitting: Oncology

## 2011-02-22 DIAGNOSIS — K7689 Other specified diseases of liver: Secondary | ICD-10-CM | POA: Insufficient documentation

## 2011-02-22 DIAGNOSIS — C50919 Malignant neoplasm of unspecified site of unspecified female breast: Secondary | ICD-10-CM

## 2011-02-22 DIAGNOSIS — R059 Cough, unspecified: Secondary | ICD-10-CM | POA: Insufficient documentation

## 2011-02-22 DIAGNOSIS — R05 Cough: Secondary | ICD-10-CM | POA: Insufficient documentation

## 2011-02-22 DIAGNOSIS — C7951 Secondary malignant neoplasm of bone: Secondary | ICD-10-CM | POA: Insufficient documentation

## 2011-02-22 DIAGNOSIS — R0602 Shortness of breath: Secondary | ICD-10-CM | POA: Insufficient documentation

## 2011-02-22 DIAGNOSIS — J90 Pleural effusion, not elsewhere classified: Secondary | ICD-10-CM | POA: Insufficient documentation

## 2011-02-22 MED ORDER — IOHEXOL 300 MG/ML  SOLN
80.0000 mL | Freq: Once | INTRAMUSCULAR | Status: AC | PRN
  Administered 2011-02-22: 80 mL via INTRAVENOUS

## 2011-02-23 ENCOUNTER — Other Ambulatory Visit: Payer: Self-pay | Admitting: Oncology

## 2011-02-23 DIAGNOSIS — C50919 Malignant neoplasm of unspecified site of unspecified female breast: Secondary | ICD-10-CM

## 2011-02-23 MED ORDER — SODIUM CHLORIDE 0.9 % IV SOLN: 840.0000 mg | Freq: Once | INTRAVENOUS | Status: DC

## 2011-02-23 NOTE — Discharge Summary (Signed)
NAME:  Kathy Jimenez, Kathy Jimenez NO.:  [id number]  MEDICAL RECORD NO.:  [id number]  LOCATION:  1425                         FACILITY:  [facility]  PHYSICIAN:  Pierce Crane, M.D., F.R.C.P.C.DATE OF BIRTH:  1970-11-08  DATE OF ADMISSION:  01/27/2011 DATE OF DISCHARGE:  01/28/2011                              DISCHARGE SUMMARY   PROBLEM LIST: 1. Hypokalemia. 2. Metastatic breast cancer.  This is a pleasant 40 year old woman with a history of metastatic breast cancer.  She initially was diagnosed with early stage breast cancer, but 8 years ago received adjuvant chemotherapy and done well.  Recent routine screening labs showed elevated tumor marker.  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  She had biopsy- proven HER2-positive breast cancer and so was started on hormonal ablation with Zoladex, tamoxifen, and Herceptin.  She was recently started on Perjeta.  She was also continued on Tykerb orally.  She had developed problems with low potassium and so was admitted to the cancer center and receiving chemotherapy and was noted to have potassium of 2.6.  Because of the time of the day, felt to be prudent to be admitted to the hospital for further management.  The plan was to give her IV potassium boluses overnight.  PHYSICAL EXAMINATION:  GENERAL:  Pleasant, alert, looking the stated age. VITAL SIGNS:  Blood pressure 140/70, temperature 97.8, pulse of 60. HEENT:  No palpable adenopathy in the head and neck area.  Oropharynx normal. LUNGS:  Clear. HEART:  Sounds are normal.  Port site is normal.  No actual adenopathy. No palpable hepatosplenomegaly.  No inguinal adenopathy.  No peripheral edema.  LABORATORY DATA:  Normal plasma potassium.  The patient will be admitted to the hospital where she will receive IV potassium replacement.  We will hopefully get her discharge in the morning.     Pierce Crane, M.D., F.R.C.P.C.     PR/MEDQ  D:   02/23/2011  T:  02/23/2011  Job:  [id number]

## 2011-02-24 ENCOUNTER — Encounter (HOSPITAL_COMMUNITY): Payer: Self-pay | Admitting: Internal Medicine

## 2011-02-24 ENCOUNTER — Ambulatory Visit (HOSPITAL_BASED_OUTPATIENT_CLINIC_OR_DEPARTMENT_OTHER): Payer: BC Managed Care – PPO | Admitting: Lab

## 2011-02-24 ENCOUNTER — Ambulatory Visit (HOSPITAL_COMMUNITY)
Admission: RE | Admit: 2011-02-24 | Discharge: 2011-02-24 | Disposition: A | Discharge status: Home / Self Care | Payer: BC Managed Care – PPO | Source: Ambulatory Visit | Attending: Oncology | Admitting: Oncology

## 2011-02-24 ENCOUNTER — Telehealth: Payer: Self-pay | Admitting: *Deleted

## 2011-02-24 ENCOUNTER — Other Ambulatory Visit: Payer: Self-pay | Admitting: Physician Assistant

## 2011-02-24 ENCOUNTER — Ambulatory Visit (HOSPITAL_BASED_OUTPATIENT_CLINIC_OR_DEPARTMENT_OTHER): Payer: BC Managed Care – PPO | Admitting: Oncology

## 2011-02-24 ENCOUNTER — Other Ambulatory Visit: Payer: Self-pay | Admitting: Certified Registered Nurse Anesthetist

## 2011-02-24 ENCOUNTER — Ambulatory Visit (HOSPITAL_BASED_OUTPATIENT_CLINIC_OR_DEPARTMENT_OTHER): Payer: BC Managed Care – PPO

## 2011-02-24 ENCOUNTER — Inpatient Hospital Stay (HOSPITAL_COMMUNITY)
Admission: AD | Admit: 2011-02-24 | Discharge: 2011-02-27 | DRG: 127 | Disposition: A | Discharge status: Home / Self Care | Payer: BC Managed Care – PPO | Source: Ambulatory Visit | Attending: Internal Medicine | Admitting: Internal Medicine

## 2011-02-24 ENCOUNTER — Other Ambulatory Visit: Payer: Self-pay | Admitting: Oncology

## 2011-02-24 DIAGNOSIS — R000 Tachycardia, unspecified: Secondary | ICD-10-CM

## 2011-02-24 DIAGNOSIS — C7900 Secondary malignant neoplasm of unspecified kidney and renal pelvis: Secondary | ICD-10-CM | POA: Diagnosis present

## 2011-02-24 DIAGNOSIS — I079 Rheumatic tricuspid valve disease, unspecified: Secondary | ICD-10-CM | POA: Insufficient documentation

## 2011-02-24 DIAGNOSIS — C50919 Malignant neoplasm of unspecified site of unspecified female breast: Secondary | ICD-10-CM

## 2011-02-24 DIAGNOSIS — R072 Precordial pain: Secondary | ICD-10-CM | POA: Insufficient documentation

## 2011-02-24 DIAGNOSIS — F411 Generalized anxiety disorder: Secondary | ICD-10-CM | POA: Diagnosis present

## 2011-02-24 DIAGNOSIS — C787 Secondary malignant neoplasm of liver and intrahepatic bile duct: Secondary | ICD-10-CM | POA: Diagnosis present

## 2011-02-24 DIAGNOSIS — I059 Rheumatic mitral valve disease, unspecified: Secondary | ICD-10-CM | POA: Insufficient documentation

## 2011-02-24 DIAGNOSIS — I5021 Acute systolic (congestive) heart failure: Principal | ICD-10-CM | POA: Diagnosis present

## 2011-02-24 DIAGNOSIS — T451X5A Adverse effect of antineoplastic and immunosuppressive drugs, initial encounter: Secondary | ICD-10-CM | POA: Diagnosis present

## 2011-02-24 DIAGNOSIS — Z170 Estrogen receptor positive status [ER+]: Secondary | ICD-10-CM

## 2011-02-24 DIAGNOSIS — Z01810 Encounter for preprocedural cardiovascular examination: Secondary | ICD-10-CM

## 2011-02-24 DIAGNOSIS — E876 Hypokalemia: Secondary | ICD-10-CM | POA: Diagnosis present

## 2011-02-24 DIAGNOSIS — R0602 Shortness of breath: Secondary | ICD-10-CM | POA: Insufficient documentation

## 2011-02-24 DIAGNOSIS — Z853 Personal history of malignant neoplasm of breast: Secondary | ICD-10-CM

## 2011-02-24 DIAGNOSIS — C7951 Secondary malignant neoplasm of bone: Secondary | ICD-10-CM | POA: Diagnosis present

## 2011-02-24 DIAGNOSIS — C7952 Secondary malignant neoplasm of bone marrow: Secondary | ICD-10-CM | POA: Diagnosis present

## 2011-02-24 HISTORY — DX: Other specified postprocedural states: Z98.890

## 2011-02-24 HISTORY — DX: Nausea with vomiting, unspecified: R11.2

## 2011-02-24 LAB — CBC WITH DIFFERENTIAL/PLATELET
BASO%: 0.1 % (ref 0.0–2.0)
Basophils Absolute: 0 10*3/uL (ref 0.0–0.1)
EOS%: 0.1 % (ref 0.0–7.0)
HCT: 35.6 % (ref 34.8–46.6)
HGB: 12.1 g/dL (ref 11.6–15.9)
LYMPH%: 10.1 % — ABNORMAL LOW (ref 14.0–49.7)
MCH: 31.4 pg (ref 25.1–34.0)
MCHC: 34 g/dL (ref 31.5–36.0)
MCV: 92.5 fL (ref 79.5–101.0)
NEUT%: 86.5 % — ABNORMAL HIGH (ref 38.4–76.8)
Platelets: 265 10*3/uL (ref 145–400)

## 2011-02-24 LAB — CREATININE, SERUM
Creatinine, Ser: 0.97 mg/dL (ref 0.50–1.10)
GFR calc non Af Amer: 72 mL/min — ABNORMAL LOW (ref >90)

## 2011-02-24 LAB — CBC
Hemoglobin: 12 g/dL (ref 12.0–15.0)
MCH: 30.5 pg (ref 26.0–34.0)
MCHC: 33.5 g/dL (ref 30.0–36.0)
Platelets: 272 10*3/uL (ref 150–400)
RDW: 13.9 % (ref 11.5–15.5)

## 2011-02-24 LAB — COMPREHENSIVE METABOLIC PANEL
AST: 52 U/L — ABNORMAL HIGH (ref 0–37)
Albumin: 3.9 g/dL (ref 3.5–5.2)
BUN: 17 mg/dL (ref 6–23)
Calcium: 9.3 mg/dL (ref 8.4–10.5)
Chloride: 110 mEq/L (ref 96–112)
Glucose, Bld: 141 mg/dL — ABNORMAL HIGH (ref 70–99)
Potassium: 4 mEq/L (ref 3.5–5.3)

## 2011-02-24 MED ORDER — ZOLPIDEM TARTRATE 5 MG PO TABS: 10.0000 mg | ORAL_TABLET | Freq: Every evening | ORAL | Status: DC | PRN

## 2011-02-24 MED ORDER — POTASSIUM CHLORIDE CRYS ER 20 MEQ PO TBCR
40.0000 meq | EXTENDED_RELEASE_TABLET | Freq: Two times a day (BID) | ORAL | Status: DC
  Administered 2011-02-24 – 2011-02-25 (×2): 40 meq via ORAL
  Filled 2011-02-24 (×3): qty 2

## 2011-02-24 MED ORDER — CAPTOPRIL 0.75 MG/ML ORAL SUSPENSION
6.2500 mg | Freq: Three times a day (TID) | ORAL | Status: DC
  Administered 2011-02-24 – 2011-02-25 (×2): 6.25 mg via ORAL
  Filled 2011-02-24 (×5): qty 8.3

## 2011-02-24 MED ORDER — SODIUM CHLORIDE 0.9 % IV SOLN: 250.0000 mL | INTRAVENOUS | Status: DC

## 2011-02-24 MED ORDER — DIGOXIN 250 MCG PO TABS
0.2500 mg | ORAL_TABLET | Freq: Every day | ORAL | Status: DC
  Administered 2011-02-24 – 2011-02-27 (×4): 0.25 mg via ORAL
  Filled 2011-02-24 (×4): qty 1

## 2011-02-24 MED ORDER — ENOXAPARIN SODIUM 40 MG/0.4ML ~~LOC~~ SOLN
40.0000 mg | SUBCUTANEOUS | Status: DC
  Administered 2011-02-24 – 2011-02-26 (×3): 40 mg via SUBCUTANEOUS
  Filled 2011-02-24 (×4): qty 0.4

## 2011-02-24 MED ORDER — FUROSEMIDE 10 MG/ML IJ SOLN
40.0000 mg | Freq: Two times a day (BID) | INTRAMUSCULAR | Status: DC
  Administered 2011-02-24 – 2011-02-25 (×2): 40 mg via INTRAVENOUS
  Filled 2011-02-24 (×4): qty 4

## 2011-02-24 MED ORDER — ALPRAZOLAM 0.25 MG PO TABS: 0.2500 mg | ORAL_TABLET | Freq: Two times a day (BID) | ORAL | Status: DC | PRN

## 2011-02-24 MED ORDER — ONDANSETRON HCL 4 MG/2ML IJ SOLN: 4.0000 mg | Freq: Four times a day (QID) | INTRAMUSCULAR | Status: DC | PRN

## 2011-02-24 MED ORDER — SPIRONOLACTONE 12.5 MG HALF TABLET
12.5000 mg | ORAL_TABLET | Freq: Every day | ORAL | Status: DC
  Administered 2011-02-24 – 2011-02-25 (×2): 12.5 mg via ORAL
  Filled 2011-02-24 (×2): qty 1

## 2011-02-24 MED ORDER — SODIUM CHLORIDE 0.9 % IJ SOLN: 3.0000 mL | INTRAMUSCULAR | Status: DC | PRN

## 2011-02-24 MED ORDER — ACETAMINOPHEN 325 MG PO TABS: 650.0000 mg | ORAL_TABLET | ORAL | Status: DC | PRN

## 2011-02-24 MED ORDER — SODIUM CHLORIDE 0.9 % IJ SOLN
3.0000 mL | Freq: Two times a day (BID) | INTRAMUSCULAR | Status: DC
  Administered 2011-02-24 – 2011-02-27 (×6): 3 mL via INTRAVENOUS

## 2011-02-24 NOTE — H&P (Signed)
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  Found to have L breast CA in 2004 underwent lumpectomy and treatment with FEC (flourouracil, epirubicin, cyclophosphamide). Also treated with hormonal therapy. 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 Started on weekly herceptin. Recently also started on Perjeta and Tykerb. Over the last week has been developing progressive DOE and edema as well as dry cough, orthopnea and PND. Had CT which showed dilated heart with pleural effusion. Saw Dr. Donnie Coffin who ordered ECHO. ECHO today EF 25% with global HK. Mod MR/TR. Admitted for HF treatment.      Review of Systems:     Cardiac Review of Systems: {Y] = yes [ ]  = no  Chest Pain [    ]  Resting SOB [ Y  ] Exertional SOB  [Y  ]  Pollyann Kennedy Gilian.Kraft  ]   Pedal Edema [ Y  ]    Palpitations [ Y ] Syncope  [  ]   Presyncope [   ]  General Review of Systems: [Y] = yes [  ]=no Constitional: recent weight change [  ]; anorexia [Y  ]; fatigue [  ]; nausea [  ]; night sweats [  ]; fever [  ]; or chills [  ];                                                                                                                                           Eye : blurred vision [  ]; diplopia [   ]; vision changes [  ];  Amaurosis fugax[  ]; Resp: cough [ Y ];  wheezing[  ];  hemoptysis[  ]; shortness of breath[ Y ]; paroxysmal nocturnal dyspnea[ Y ]; dyspnea on exertion[ Y ]; or orthopnea[Y  ];  GI:  gallstones[  ], vomiting[  ];  dysphagia[  ]; melena[  ];  hematochezia [  ]; heartburn[  ];   Hx of  Colonoscopy[  ]; GU: kidney stones [Y  ]; hematuria[  ];   dysuria [  ];  nocturia[  ];  history of     obstruction [  ];                 Skin: rash, swelling[  ];, hair loss[  ];  peripheral edema[ Y ];  or  itching[  ]; Musculosketetal: myalgias[  ];  joint swelling[  ];  joint erythema[  ];  joint pain[  ];  back pain[  ];  Heme/Lymph: bruising[  ];  bleeding[  ];  anemia[  ];  Neuro: TIA[  ];  headaches[  ];  stroke[  ];  vertigo[  ];  seizures[  ];   paresthesias[  ];  difficulty walking[  ];  Psych:depression[  ]; anxiety[  ];  Endocrine: diabetes[  ];  thyroid dysfunction[  ];  Immunizations: Flu [  ]; Pneumococcal[  ];  Other:  Past Medical History  Diagnosis Date  . 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 Kidney stones     recurrent since 1989  . Endometrial polyp     Medications Prior to Admission  Medication Dose Route Frequency Provider Last Rate Last Dose  . DISCONTD: pertuzumab (PERJETA) 840 mg in sodium chloride 0.9 % 250 mL chemo infusion  840 mg Intravenous Once Pierce Crane, MD       Medications Prior to Admission  Medication Sig Dispense Refill  . potassium chloride SA (K-DUR,KLOR-CON) 20 MEQ tablet Take 20 mEq by mouth 3 (three) times daily.        . SODIUM CITRATE PO Take by mouth 2 (two) times daily.           Allergies  Allergen Reactions  . Sulfa Antibiotics Hives    History   Social History  . Marital Status: Divorced    Spouse Name: N/A    Number of Children: N/A  . Years of Education: N/A   Occupational History  . Director of [employer]   Social History Main Topics  . Smoking status: Never Smoker   . Smokeless tobacco: Not on file  . Alcohol Use: No  . Drug Use: No  . Sexually Active: Not on file   Other Topics Concern  . Not on file   Social History Narrative  . No narrative on file    No FHX of premature heart disease or HF.  PHYSICAL EXAM: Filed Vitals:   02/24/11 1500  BP: 146/95  Pulse: 119  Temp: 97.6 F (36.4 C)   General:  Mildly dyspneic. HEENT: normal Neck: supple. JVP to ear. Carotids 2+ bilat; no bruits. No lymphadenopathy or thryomegaly appreciated. Cor: PMI mildly  displaced. Regular. Tachycardic. +prominent S3. 2/6 MR/TR Lungs: clear. Dull at bases Abdomen: soft, nontender, nondistended. No hepatosplenomegaly. No bruits or masses. Good bowel sounds. Extremities: no cyanosis, clubbing, rash, 1= edema Neuro: alert & oriented x 3, cranial nerves grossly intact. moves all 4 extremities w/o difficulty. Affect pleasant.  ECG: pending  Results for orders placed in visit on 02/24/11 (from the past 24 hour(s))  BRAIN NATRIURETIC PEPTIDE     Status: Abnormal   Collection Time   02/24/11  1:56 PM      Component Value Range   Brain Natriuretic Peptide 444.5 (*) 0.0 - 100.0 (pg/mL)   Narrative:    Performed at:  [facility]               [address]               [place], [place] [address]   No results found.   ASSESSMENT: 1. Acute systolic HF - likely 2/2 chemotoxicity         --EF 25-30% by ECHO. RV normal 2. Metastatic Breast CA 3. H/o hypokalemia 4. H/o kidney stones  PLAN/DISCUSSION:  Kathy Jimenez has advanced HF symptoms with significant volume overload. We had a long talk about the etiology of chemo-related cardiotoxicity and our plan of treatment. We discussed that our eventual goal would be to get her heart stronger so she could resume her chemotherapy but she understands that this may take months.  Will start with lasix 40 IV bid, ACE-I and digoxin. Will attempt to add b-blocker over next few days. Given h/o hypokalemia will also start low-dose spiro and watch electrolytes closely.

## 2011-02-24 NOTE — Plan of Care (Signed)
Problem: Phase I Progression Outcomes Goal: EF % per last Echo/documented,Core Reminder form on chart Outcome: Completed/Met Date Met:  02/24/11 25-30%

## 2011-02-24 NOTE — Progress Notes (Signed)
Hematology and Oncology Follow Up Visit  Kathy Jimenez [id number] 02/14/1971 40 y.o. 02/24/2011 1:45 PM   Principle Diagnosis: Metastatic HER-2 positive ER/PR positive breast cancer currently receiving he Herceptin, perjeta and hormonal blockade. Previous history of breast cancer in 2004 status post FEC x6 and radiation.  Interim History:  He returns for followup. She was complaining of shortness of breath earlier in the week today she was noticing more tachycardia. A CT scan of the chest was done earlier in the week. There was vague airspace disease seen. Was put on an antibiotic and low-dose steroids. She returns today complaining of tachycardia. Her O2 saturations at rest and on exerciser approximately 96-99%.  Medications: I have reviewed the patient's current medications.  Allergies:  Allergies  Allergen Reactions  . Sulfa Antibiotics Hives    Past Medical History, Surgical history, Social history, and Family History were reviewed and updated.  Review of Systems: Constitutional:  Negative for fever, chills, night sweats, anorexia, weight loss, pain. Cardiovascular: positive for - dyspnea on exertion , cough Respiratory: positive for - cough and shortness of breath Neurological: no TIA or stroke symptoms Dermatological: negative ENT: negative Skin Gastrointestinal: no abdominal pain, change in bowel habits, or black or bloody stools Genito-Urinary: no dysuria, trouble voiding, or hematuria Hematological and Lymphatic: negative Breast: negative for breast lumps Musculoskeletal: negative Remaining ROS negative.  Physical Exam: There were no vitals taken for this visit. HR- 130 ECOG: 0 General appearance: alert, cooperative and appears stated age Head: Normocephalic, without obvious abnormality, atraumatic Neck: no adenopathy, no carotid bruit, no JVD, supple, symmetrical, trachea midline and thyroid not enlarged, symmetric, no tenderness/mass/nodules Lymph nodes:  Cervical, supraclavicular, and axillary nodes normal. Cardiac : She appears to be in sinus tachycardia, there is JVD there seems to be an S3 heard. Lungs show decreased air entry in both lung lower lung fields. Pulmonary: Breasts: Not examined Abdomen: Normal Extremities mild edema Neuro: Grossly normal  Lab Results: Lab Results  Component Value Date   WBC 4.6 02/03/2011   HGB 13.3 02/03/2011   HCT 37.8 02/03/2011   MCV 89.9 02/03/2011   PLT 202 02/03/2011     Chemistry      Component Value Date/Time   NA 143 02/03/2011 0847   NA 143 02/03/2011 0847   NA 141 09/02/2010 1134   K 3.3* 02/03/2011 0847   K 3.3* 02/03/2011 0847   K 4.3 09/02/2010 1134   CL 112 02/03/2011 0847   CL 112 02/03/2011 0847   CL 103 09/02/2010 1134   CO2 23 02/03/2011 0847   CO2 23 02/03/2011 0847   CO2 25 09/02/2010 1134   BUN 9 02/03/2011 0847   BUN 9 02/03/2011 0847   BUN 11 09/02/2010 1134   CREATININE 0.80 02/03/2011 0847   CREATININE 0.80 02/03/2011 0847   CREATININE 0.7 09/02/2010 1134      Component Value Date/Time   CALCIUM 8.7 02/03/2011 0847   CALCIUM 8.7 02/03/2011 0847   CALCIUM 8.4 09/02/2010 1134   ALKPHOS 59 02/03/2011 0847   ALKPHOS 59 02/03/2011 0847   ALKPHOS 84 09/02/2010 1134   AST 21 02/03/2011 0847   AST 21 02/03/2011 0847   AST 27 09/02/2010 1134   ALT 16 02/03/2011 0847   ALT 16 02/03/2011 0847   BILITOT 0.3 02/03/2011 0847   BILITOT 0.3 02/03/2011 0847   BILITOT 0.60 09/02/2010 1134       Radiological Studies: chest X-ray  n/a Mammogram n/a Bone density n/a  Impression and Plan:  As best as developed tachycardia, dyspnea cough and malaise. As clinical signs and symptoms of CHF. We are unable to an EKG in the office. I have sent her to begin urgent 2-D echo and refer to see Dr. Jones Broom  More than 50% of the visit was spent in patient-related counselling   Pierce Crane, MD 11/16/20121:45 PM

## 2011-02-24 NOTE — Telephone Encounter (Signed)
DR. Renelda Loma NURSE, ANGIE WELCH,RN TO FAX ORDER FOR STAT ECHO.

## 2011-02-25 ENCOUNTER — Other Ambulatory Visit: Payer: Self-pay

## 2011-02-25 ENCOUNTER — Encounter (HOSPITAL_COMMUNITY): Payer: Self-pay | Admitting: Nurse Practitioner

## 2011-02-25 ENCOUNTER — Ambulatory Visit: Payer: BC Managed Care – PPO

## 2011-02-25 LAB — BASIC METABOLIC PANEL
BUN: 21 mg/dL (ref 6–23)
BUN: 20 mg/dL (ref 6–23)
Calcium: 9.2 mg/dL (ref 8.4–10.5)
Calcium: 9.3 mg/dL (ref 8.4–10.5)
Chloride: 108 mEq/L (ref 96–112)
Creatinine, Ser: 1.15 mg/dL — ABNORMAL HIGH (ref 0.50–1.10)
GFR calc Af Amer: 68 mL/min — ABNORMAL LOW (ref >90)
GFR calc Af Amer: 68 mL/min — ABNORMAL LOW (ref >90)
GFR calc non Af Amer: 58 mL/min — ABNORMAL LOW (ref >90)
Potassium: 4 mEq/L (ref 3.5–5.1)

## 2011-02-25 LAB — D-DIMER, QUANTITATIVE: D-Dimer, Quant: 0.8 ug/mL-FEU — ABNORMAL HIGH (ref 0.00–0.48)

## 2011-02-25 LAB — CBC
HCT: 36.7 % (ref 36.0–46.0)
MCHC: 34.1 g/dL (ref 30.0–36.0)
MCV: 91.8 fL (ref 78.0–100.0)
Platelets: 263 10*3/uL (ref 150–400)
RDW: 14 % (ref 11.5–15.5)
WBC: 10.1 10*3/uL (ref 4.0–10.5)

## 2011-02-25 LAB — TSH: TSH: 3.03 u[IU]/mL (ref 0.350–4.500)

## 2011-02-25 MED ORDER — FUROSEMIDE 10 MG/ML IJ SOLN
20.0000 mg | Freq: Two times a day (BID) | INTRAMUSCULAR | Status: DC
  Administered 2011-02-25: 20 mg via INTRAVENOUS
  Filled 2011-02-25 (×2): qty 2

## 2011-02-25 MED ORDER — CARVEDILOL 3.125 MG PO TABS
3.1250 mg | ORAL_TABLET | Freq: Two times a day (BID) | ORAL | Status: DC
  Administered 2011-02-25 – 2011-02-26 (×3): 3.125 mg via ORAL
  Filled 2011-02-25 (×4): qty 1

## 2011-02-25 MED ORDER — SPIRONOLACTONE 25 MG PO TABS
25.0000 mg | ORAL_TABLET | Freq: Every day | ORAL | Status: DC
  Administered 2011-02-26 – 2011-02-27 (×3): 25 mg via ORAL
  Filled 2011-02-25 (×2): qty 1

## 2011-02-25 MED ORDER — DIAZEPAM 5 MG PO TABS
5.0000 mg | ORAL_TABLET | Freq: Once | ORAL | Status: AC
  Administered 2011-02-25: 5 mg via ORAL
  Filled 2011-02-25: qty 1

## 2011-02-25 MED ORDER — CAPTOPRIL 0.75 MG/ML ORAL SUSPENSION
12.5000 mg | Freq: Three times a day (TID) | ORAL | Status: DC
  Administered 2011-02-25 (×2): 12.5 mg via ORAL
  Filled 2011-02-25 (×5): qty 16.7

## 2011-02-25 MED ORDER — POTASSIUM CHLORIDE CRYS ER 20 MEQ PO TBCR
20.0000 meq | EXTENDED_RELEASE_TABLET | Freq: Two times a day (BID) | ORAL | Status: DC
  Administered 2011-02-25 – 2011-02-26 (×3): 20 meq via ORAL
  Filled 2011-02-25 (×2): qty 1

## 2011-02-25 NOTE — Progress Notes (Signed)
iNSERTED ON WRONG TIME

## 2011-02-25 NOTE — Progress Notes (Signed)
Subjective:   Diuresed over 2L last night. Feeling better. Walking the hall briskly. Mild dyspnea. No orthopnea or PND. HR coming down slowly now in 95-105 range.      Intake/Output Summary (Last 24 hours) at 02/25/11 0936 Last data filed at 02/25/11 0700  Gross per 24 hour  Intake    240 ml  Output   2300 ml  Net  -2060 ml    Current meds:    . captopril  6.25 mg Oral TID  . digoxin  0.25 mg Oral Daily  . enoxaparin  40 mg Subcutaneous Q24H  . furosemide  40 mg Intravenous BID  . potassium chloride  40 mEq Oral BID  . sodium chloride  3 mL Intravenous Q12H  . spironolactone  12.5 mg Oral Daily   Infusions:    . DISCONTD: sodium chloride       Objective:  Blood pressure 123/86, pulse 103, temperature 98.3 F (36.8 C), temperature source Oral, resp. rate 16, height 5\' 4"  (1.626 m), weight 71.487 kg (157 lb 9.6 oz), SpO2 95.00%. Weight change:   Physical Exam: General:  Well appearing. No resp difficulty HEENT: normal Neck: supple. JVP 8 . Carotids 2+ bilat; no bruits. No lymphadenopathy or thryomegaly appreciated. Cor: PMI nondisplaced. Regular rate & rhythm. Soft s3 Lungs: clear Abdomen: soft, nontender, nondistended. No hepatosplenomegaly. No bruits or masses. Good bowel sounds. Extremities: no cyanosis, clubbing, rash. 1+ edema Neuro: alert & orientedx3, cranial nerves grossly intact. moves all 4 extremities w/o difficulty. Affect pleasant  Telemetry: SR 90-100  Lab Results: Basic Metabolic Panel:  Lab 02/25/11 8295 02/24/11 1830 02/24/11 1059  NA 144 -- 142  K 4.0 -- 4.0  CL 107 -- 110  CO2 26 -- 20  GLUCOSE 93 -- 141*  BUN 21 -- 17  CREATININE 1.15* 0.97 0.97  CALCIUM 9.2 -- 9.3  MG 1.9 -- --  PHOS -- -- --   Liver Function Tests:  Lab 02/24/11 1059  AST 52*  ALT 63*  ALKPHOS 70  BILITOT 0.4  PROT 5.9*  ALBUMIN 3.9   No results found for this basename: LIPASE:5,AMYLASE:5 in the last 168 hours No results found for this basename: AMMONIA:5  in the last 168 hours CBC:  Lab 02/24/11 1830 02/24/11 1059  WBC 14.7* 12.3*  NEUTROABS -- --  HGB 12.0 12.1  HCT 35.8* 35.6  MCV 91.1 92.5  PLT 272 265   Cardiac Enzymes: No results found for this basename: CKTOTAL:5,CKMB:5,CKMBINDEX:5,TROPONINI:5 in the last 168 hours BNP: No results found for this basename: POCBNP:5 in the last 168 hours CBG: No results found for this basename: GLUCAP:5 in the last 168 hours Microbiology: Lab Results  Component Value Date   CULT INSIGNIFICANT GROWTH 10/11/2010   No results found for this basename: CULT:2,SDES:2 in the last 168 hours  Imaging: No results found.   ASSESSMENT:  1. Acute systolic HF - likely 2/2 chemotoxicity  --EF 25-30% by ECHO. RV normal  2. Metastatic Breast CA  3. H/o hypokalemia  4. H/o kidney stones   PLAN/DISCUSSION:  Volume status improving. Cr up slightly. Continue to titrate HF meds as tolerated. Will add low dose b-blocker and increase captopril. Change lasix to 20 IV bid. WBC up slightly. No obvious infection. Will follow. Place TED hose.     LOS: 1 day    Arvilla Meres, MD 02/25/2011, 9:36 AM

## 2011-02-26 LAB — CARDIAC PANEL(CRET KIN+CKTOT+MB+TROPI)
CK, MB: 2.5 ng/mL (ref 0.3–4.0)
Troponin I: <0.3 ng/mL (ref <0.30)

## 2011-02-26 LAB — BASIC METABOLIC PANEL
BUN: 21 mg/dL (ref 6–23)
Chloride: 103 mEq/L (ref 96–112)
Creatinine, Ser: 1.22 mg/dL — ABNORMAL HIGH (ref 0.50–1.10)
GFR calc Af Amer: 63 mL/min — ABNORMAL LOW (ref >90)
GFR calc non Af Amer: 54 mL/min — ABNORMAL LOW (ref >90)
Potassium: 4.5 mEq/L (ref 3.5–5.1)

## 2011-02-26 MED ORDER — LISINOPRIL 10 MG PO TABS
10.0000 mg | ORAL_TABLET | Freq: Two times a day (BID) | ORAL | Status: DC
  Administered 2011-02-26 – 2011-02-27 (×3): 10 mg via ORAL
  Filled 2011-02-26 (×4): qty 1

## 2011-02-26 MED ORDER — CLONAZEPAM 0.5 MG PO TABS
0.2500 mg | ORAL_TABLET | Freq: Two times a day (BID) | ORAL | Status: DC
  Administered 2011-02-26 – 2011-02-27 (×3): 0.25 mg via ORAL
  Filled 2011-02-26 (×3): qty 1

## 2011-02-26 MED ORDER — FUROSEMIDE 40 MG PO TABS
40.0000 mg | ORAL_TABLET | Freq: Every day | ORAL | Status: DC
  Filled 2011-02-26 (×2): qty 1

## 2011-02-26 MED ORDER — FUROSEMIDE 40 MG PO TABS
40.0000 mg | ORAL_TABLET | Freq: Every day | ORAL | Status: DC
  Administered 2011-02-27: 40 mg via ORAL
  Filled 2011-02-26 (×2): qty 1

## 2011-02-26 MED ORDER — FUROSEMIDE 10 MG/ML IJ SOLN
20.0000 mg | Freq: Once | INTRAMUSCULAR | Status: AC
  Administered 2011-02-26: 20 mg via INTRAVENOUS

## 2011-02-26 NOTE — Progress Notes (Addendum)
Subjective:   Had episode of R-sided CP last night. ECG, CE and d-dimer OK.  I/O only - 160 but weight down 2-3 pounds so suspect I/O inaccurate. Low dose carvedilol started. BP improved  Feeling better. Walking the hall briskly. Mild dyspnea. No orthopnea or PND. HR coming down slowly now in 85-110 range.      Intake/Output Summary (Last 24 hours) at 02/26/11 0851 Last data filed at 02/25/11 1700  Gross per 24 hour  Intake    240 ml  Output    400 ml  Net   -160 ml    Current meds:    . captopril  12.5 mg Oral TID  . carvedilol  3.125 mg Oral BID WC  . diazepam  5 mg Oral Once  . digoxin  0.25 mg Oral Daily  . enoxaparin  40 mg Subcutaneous Q24H  . furosemide  20 mg Intravenous BID  . potassium chloride  20 mEq Oral BID  . sodium chloride  3 mL Intravenous Q12H  . spironolactone  25 mg Oral Daily  . DISCONTD: captopril  6.25 mg Oral TID  . DISCONTD: furosemide  40 mg Intravenous BID  . DISCONTD: potassium chloride  40 mEq Oral BID  . DISCONTD: spironolactone  12.5 mg Oral Daily   Infusions:     Objective:  Blood pressure 103/70, pulse 85, temperature 98.8 F (37.1 C), temperature source Oral, resp. rate 22, height 5\' 4"  (1.626 m), weight 69 kg (152 lb 1.9 oz), SpO2 96.00%. Weight change: -3.8 kg (-8 lb 6 oz)  Physical Exam: General:  Well appearing. No resp difficulty HEENT: normal Neck: supple. JVP 6 . Carotids 2+ bilat; no bruits. No lymphadenopathy or thryomegaly appreciated. Cor: PMI nondisplaced. Regular rate & rhythm.  Lungs: clear Abdomen: soft, nontender, nondistended. No hepatosplenomegaly. No bruits or masses. Good bowel sounds. Extremities: no cyanosis, clubbing, rash. 1+ edema Neuro: alert & orientedx3, cranial nerves grossly intact. moves all 4 extremities w/o difficulty. Affect pleasant  Telemetry: SR 85-110  Lab Results: Basic Metabolic Panel:  Lab 02/26/11 4540 02/25/11 1050 02/25/11 0600 02/24/11 1830 02/24/11 1059  NA 142 144 144 -- 142    K 4.5 3.7 -- -- --  CL 103 108 107 -- 110  CO2 28 24 26  -- 20  GLUCOSE 87 85 93 -- 141*  BUN 21 20 21  -- 17  CREATININE 1.22* 1.15* 1.15* 0.97 0.97  CALCIUM 9.0 9.3 9.2 -- 9.3  MG -- -- 1.9 -- --  PHOS -- -- -- -- --   Liver Function Tests:  Lab 02/24/11 1059  AST 52*  ALT 63*  ALKPHOS 70  BILITOT 0.4  PROT 5.9*  ALBUMIN 3.9   No results found for this basename: LIPASE:5,AMYLASE:5 in the last 168 hours No results found for this basename: AMMONIA:5 in the last 168 hours CBC:  Lab 02/25/11 1050 02/24/11 1830 02/24/11 1059  WBC 10.1 14.7* 12.3*  NEUTROABS -- -- --  HGB 12.5 12.0 12.1  HCT 36.7 35.8* 35.6  MCV 91.8 91.1 92.5  PLT 263 272 265   Cardiac Enzymes:  Lab 02/26/11 0111 02/25/11 1811  CKTOTAL 61 74  CKMB 2.5 2.9  CKMBINDEX -- --  TROPONINI <0.30 <0.30   BNP: No results found for this basename: POCBNP:5 in the last 168 hours CBG: No results found for this basename: GLUCAP:5 in the last 168 hours Microbiology: Lab Results  Component Value Date   CULT INSIGNIFICANT GROWTH 10/11/2010   No results found for this basename:  CULT:2,SDES:2 in the last 168 hours  Imaging: No results found.   ASSESSMENT:  1. Acute systolic HF - likely 2/2 chemotoxicity  --EF 25-30% by ECHO. RV normal  2. Metastatic Breast CA  3. H/o hypokalemia  4. H/o kidney stones 5. Anxiety  PLAN/DISCUSSION:  Looks much better. Will change lasix to po. Increase lisinopril. Start low-dose klonopin. Long talk about what to expect from a cardiac perspective.    LOS: 2 days    Arvilla Meres, MD 02/26/2011, 8:51 AM   ADDENDUM: RN called for order clarification. Lasix IV to be given today and start Lasix po tomorrow. Orders entered incorrectly. I have changed orders for Lasix 20 mg IV once today, then start Lasix 40 mg po QD tomorrow. Tereso Newcomer, PA-C  10:33 AM 02/26/2011    Truman Hayward 1:06 PM

## 2011-02-27 LAB — BASIC METABOLIC PANEL
CO2: 26 mEq/L (ref 19–32)
Glucose, Bld: 97 mg/dL (ref 70–99)
Potassium: 4.3 mEq/L (ref 3.5–5.1)
Sodium: 140 mEq/L (ref 135–145)

## 2011-02-27 MED ORDER — DIGOXIN 250 MCG PO TABS: 0.2500 mg | ORAL_TABLET | Freq: Every day | ORAL | Status: DC

## 2011-02-27 MED ORDER — CARVEDILOL 6.25 MG PO TABS: 6.2500 mg | ORAL_TABLET | Freq: Two times a day (BID) | ORAL | Status: DC

## 2011-02-27 MED ORDER — CLONAZEPAM 0.5 MG PO TABS: 0.2500 mg | ORAL_TABLET | Freq: Two times a day (BID) | ORAL | Status: DC

## 2011-02-27 MED ORDER — CARVEDILOL 6.25 MG PO TABS
6.2500 mg | ORAL_TABLET | Freq: Two times a day (BID) | ORAL | Status: DC
  Administered 2011-02-27: 6.25 mg via ORAL
  Filled 2011-02-27 (×3): qty 1

## 2011-02-27 MED ORDER — SPIRONOLACTONE 25 MG PO TABS: 25.0000 mg | ORAL_TABLET | Freq: Every day | ORAL | Status: DC

## 2011-02-27 MED ORDER — LISINOPRIL 10 MG PO TABS: 10.0000 mg | ORAL_TABLET | Freq: Two times a day (BID) | ORAL | Status: DC

## 2011-02-27 MED ORDER — FUROSEMIDE 20 MG PO TABS: 20.0000 mg | ORAL_TABLET | Freq: Every day | ORAL | Status: DC | PRN

## 2011-02-27 NOTE — Discharge Summary (Signed)
Patient ID: Kathy Jimenez MRN: [id number] DOB/AGE: 05/15/1970 40 y.o.  Admit date: 02/24/2011 Discharge date: 02/27/2011  Primary Discharge Diagnosis.  Acute systolic HF - likely 2/2 chemotoxicity EF 25-30% by ECHO. RV normal     Secondary Discharge Diagnosis 1. Metastatic Breast CA  2. H/o hypokalemia  3. 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  Found to have L breast CA in 2004 underwent lumpectomy and treatment with FEC (flourouracil, epirubicin, cyclophosphamide). Also treated with hormonal therapy. Did well until April of this year when found to have recurrent metastatic breast CA.Marland Kitchen Started on weekly herceptin. Recently also started on Perjeta and Tykerb. Over the last week has been developing progressive DOE and edema as well as dry cough, orthopnea and PND. Had CT which showed dilated heart with pleural effusion. Saw Dr. Donnie Coffin who ordered ECHO. EF 25-30% with global HK. Mod MR/TR. Admitted for HF treatment.   On admission was modertely fluid overloaded and tachycardic. Diureses about 9 pounds with IV lasix. Also started on digoxin, spiro, carvedilol and captopril which was eventually changed to lisinopril. Admit weight 160 pounds and discharge weight was 151 pounds. Euvolemic on d/c. SBP a bit soft at 97 but asymptomatic.  At time of d/c lasix held due to h/o severe kidney stones. Instructed to use lasix 20 mg only prn for weight gain 2-3 pounds.    Discharge Info: Blood pressure 97/62, pulse 77, temperature 97.8 F (36.6 C), temperature source Oral, resp. rate 18, height 5\' 4"  (1.626 m), weight 68.539 kg (151 lb 1.6 oz), SpO2 97.00%.  Weight change: -0.462 kg (-1 lb 0.3 oz) Results for orders placed during the hospital encounter of 02/24/11 (from the past 24 hour(s))  BASIC METABOLIC PANEL     Status: Abnormal   Collection Time   02/27/11  7:25 AM      Component Value Range   Sodium 140  135 - 145 (mEq/L)   Potassium 4.3  3.5 - 5.1 (mEq/L)   Chloride 103  96 - 112 (mEq/L)   CO2 26  19 - 32 (mEq/L)   Glucose, Bld 97  70 - 99 (mg/dL)   BUN 19  6 - 23 (mg/dL)   Creatinine, Ser 9.14 (*) 0.50 - 1.10 (mg/dL)   Calcium 9.6  8.4 - 78.2 (mg/dL)   GFR calc non Af Amer 60 (*) >90 (mL/min)   GFR calc Af Amer 69 (*) >90 (mL/min)     Discharge Medications: Medication List  As of 02/27/2011  9:27 AM   START taking these medications         carvedilol 6.25 MG tablet   Commonly known as: COREG   Take 1 tablet (6.25 mg total) by mouth 2 (two) times daily with a meal.      clonazePAM 0.5 MG tablet   Commonly known as: KLONOPIN   Take 0.5 tablets (0.25 mg total) by mouth 2 (two) times daily.      digoxin 0.25 MG tablet   Commonly known as: LANOXIN   Take 1 tablet (0.25 mg total) by mouth daily.      furosemide 20 MG tablet   Commonly known as: LASIX   Take 1 tablet (20 mg total) by mouth daily as needed (If weight increases 3 pounds in 24 hours).      lisinopril 10 MG tablet  Commonly known as: PRINIVIL,ZESTRIL   Take 1 tablet (10 mg total) by mouth 2 (two) times daily.      spironolactone 25 MG tablet   Commonly known as: ALDACTONE   Take 1 tablet (25 mg total) by mouth daily.         CONTINUE taking these medications         SODIUM CITRATE PO         STOP taking these medications         azithromycin 250 MG tablet      methylPREDNISolone 4 MG tablet      potassium chloride SA 20 MEQ tablet          Where to get your medications    These are the prescriptions that you need to pick up. We sent them to a specific pharmacy, so you will need to go there to get them.   [id number] - [place], [place] - [address]    [address] [place] [address]    Phone: [phone]        carvedilol 6.25 MG tablet   digoxin  0.25 MG tablet   furosemide 20 MG tablet   lisinopril 10 MG tablet   spironolactone 25 MG tablet         You may get these medications from any pharmacy.         clonazePAM 0.5 MG tablet              Follow-up Plans & Instructions: Discharge Orders    Future Appointments: Provider: Department: Dept Phone: Center:   03/03/2011 8:15 AM Leighton Ruff Womack [facility] [phone] None   03/03/2011 8:45 AM [facility] [phone] None   03/06/2011 1:15 PM [facility] [phone] None   03/10/2011 9:00 AM Dava Najjar Elie Goody [facility] [phone] None   03/10/2011 9:30 AM [facility] [phone] None   03/15/2011 8:30 AM [facility] Inj Nurse [facility] [phone] None   03/17/2011 8:00 AM Stephanie Acre [facility] [phone] None   03/17/2011 8:30 AM [facility] [phone] None   03/18/2011 9:00 AM [facility] Inj Nurse [facility] [phone] None   03/24/2011 8:30 AM Stephanie Acre [facility] [phone] None   03/24/2011 9:00 AM [facility] A3 [facility] [phone] None   03/25/2011 8:15 AM [facility] Inj [facility] [phone] None     Follow-up Information    Follow up with Arvilla Meres, MD on 03/06/2011. (Monday 03/06/2011 1:15)    Contact information:   [facility] [address] [place] [address] [phone]           BRING ALL MEDICATIONS WITH YOU TO FOLLOW UP APPOINTMENTS  Time spent with patient to include physician time: 50 mins Signed:  CLEGG,AMY, NP 02/27/2011, 9:24 AM

## 2011-02-27 NOTE — Progress Notes (Signed)
Pt's bp=97/62, p=77, pt does have an order for coreg 6.25mg , md paged to verify if he still wanted med to given inspite  Of low bp. Still awaiting call back . Will pass it on to am rn to follow up.  Ereka Brau, Mairim Bade, rn

## 2011-02-28 NOTE — Discharge Summary (Signed)
Patient seen and examined with Amy Clegg, NP. We discussed all aspects of the encounter. I agree with the assessment and plan as stated above.   

## 2011-03-01 ENCOUNTER — Other Ambulatory Visit: Payer: Self-pay | Admitting: Oncology

## 2011-03-03 ENCOUNTER — Other Ambulatory Visit: Payer: Self-pay | Admitting: Oncology

## 2011-03-03 ENCOUNTER — Ambulatory Visit: Payer: BC Managed Care – PPO

## 2011-03-03 ENCOUNTER — Other Ambulatory Visit: Payer: BC Managed Care – PPO

## 2011-03-03 ENCOUNTER — Ambulatory Visit (HOSPITAL_BASED_OUTPATIENT_CLINIC_OR_DEPARTMENT_OTHER): Payer: BC Managed Care – PPO

## 2011-03-03 ENCOUNTER — Telehealth: Payer: Self-pay | Admitting: *Deleted

## 2011-03-03 VITALS — BP 138/85 | HR 75 | Temp 96.1°F

## 2011-03-03 DIAGNOSIS — C7952 Secondary malignant neoplasm of bone marrow: Secondary | ICD-10-CM

## 2011-03-03 DIAGNOSIS — C50919 Malignant neoplasm of unspecified site of unspecified female breast: Secondary | ICD-10-CM

## 2011-03-03 DIAGNOSIS — C7951 Secondary malignant neoplasm of bone: Secondary | ICD-10-CM

## 2011-03-03 DIAGNOSIS — Z5111 Encounter for antineoplastic chemotherapy: Secondary | ICD-10-CM

## 2011-03-03 DIAGNOSIS — C787 Secondary malignant neoplasm of liver and intrahepatic bile duct: Secondary | ICD-10-CM

## 2011-03-03 LAB — COMPREHENSIVE METABOLIC PANEL
Albumin: 4.1 g/dL (ref 3.5–5.2)
Alkaline Phosphatase: 75 U/L (ref 39–117)
BUN: 16 mg/dL (ref 6–23)
CO2: 25 mEq/L (ref 19–32)
Calcium: 8.8 mg/dL (ref 8.4–10.5)
Glucose, Bld: 119 mg/dL — ABNORMAL HIGH (ref 70–99)
Potassium: 4.7 mEq/L (ref 3.5–5.3)
Total Protein: 6.4 g/dL (ref 6.0–8.3)

## 2011-03-03 LAB — CBC WITH DIFFERENTIAL/PLATELET
Basophils Absolute: 0 10*3/uL (ref 0.0–0.1)
Eosinophils Absolute: 0.2 10*3/uL (ref 0.0–0.5)
HGB: 14.3 g/dL (ref 11.6–15.9)
MCV: 91.4 fL (ref 79.5–101.0)
MONO#: 0.5 10*3/uL (ref 0.1–0.9)
MONO%: 8.4 % (ref 0.0–14.0)
NEUT#: 3.3 10*3/uL (ref 1.5–6.5)
Platelets: 260 10*3/uL (ref 145–400)
RDW: 13.4 % (ref 11.2–14.5)
WBC: 5.6 10*3/uL (ref 3.9–10.3)

## 2011-03-03 MED ORDER — FULVESTRANT 250 MG/5ML IM SOLN
500.0000 mg | Freq: Once | INTRAMUSCULAR | Status: AC
  Administered 2011-03-03: 500 mg via INTRAMUSCULAR
  Filled 2011-03-03: qty 10

## 2011-03-03 MED ORDER — DENOSUMAB 120 MG/1.7ML ~~LOC~~ SOLN
120.0000 mg | Freq: Once | SUBCUTANEOUS | Status: AC
  Administered 2011-03-03: 120 mg via SUBCUTANEOUS
  Filled 2011-03-03: qty 1.7

## 2011-03-03 MED ORDER — GOSERELIN ACETATE 3.6 MG ~~LOC~~ IMPL
3.6000 mg | DRUG_IMPLANT | Freq: Once | SUBCUTANEOUS | Status: AC
  Administered 2011-03-03: 3.6 mg via SUBCUTANEOUS
  Filled 2011-03-03: qty 3.6

## 2011-03-03 NOTE — Telephone Encounter (Signed)
Called patient on cell phone gave patient appointment for 03-03-2011 at 11:30am. For her injection

## 2011-03-03 NOTE — Patient Instructions (Signed)
Call MD for problems 

## 2011-03-06 ENCOUNTER — Ambulatory Visit (HOSPITAL_COMMUNITY)
Admission: RE | Admit: 2011-03-06 | Discharge: 2011-03-06 | Disposition: A | Discharge status: Home / Self Care | Payer: BC Managed Care – PPO | Source: Ambulatory Visit | Attending: Internal Medicine | Admitting: Internal Medicine

## 2011-03-06 VITALS — BP 104/62 | HR 86 | Wt 155.0 lb

## 2011-03-06 DIAGNOSIS — I5022 Chronic systolic (congestive) heart failure: Secondary | ICD-10-CM

## 2011-03-06 MED ORDER — CARVEDILOL 6.25 MG PO TABS: 9.3750 mg | ORAL_TABLET | Freq: Two times a day (BID) | ORAL | Status: DC

## 2011-03-06 NOTE — Assessment & Plan Note (Addendum)
Volume status looks good today.  NYHA II.  Will titrate coreg to 9.375 mg BID, hopefully we can titrate up to 12.5 mg BID.  Continue sliding scale lasix.  Will repeat echo in December.    Patient seen and examined with Ulyess Blossom PA-C. We discussed all aspects of the encounter. I agree with the assessment and plan as stated above. Continue to treat aggressively with eye to restarting Herceptin as as soon as possible.

## 2011-03-06 NOTE — Progress Notes (Signed)
HPI:  Kathy Jimenez is a 40 y.o. female with L breast CA in 2004 s/p lumpectomy and treatment with FEC (flourouracil, epirubicin, cyclophosphamide). Also treated with hormonal therapy. 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  Started on weekly herceptin. Recently also started on Perjeta and Tykerb. Dr. Donnie Coffin evaluated her for a cough and progressive DOE; CT which showed dilated heart with pleural effusion and echo with EF 25% with global HK. Mod MR/TR. Admitted to the hospital for volume overload and tachycardia.  Diuresed 9 lbs, discharge weight 151 lbs.  With history of severe kidney stones, lasix 20 mg given prn.    She returns for follow up today.  She feels really good.  Her SOB has improved.  She was able to work in her garden without difficulty.  No orthopnea/PND.  No syncope.  No edema.  Weighing daily, stable 148.6-151 lbs.  Has taken lasix 20 mg twice since discharge with increased weight 1-2 lbs.     ROS: All systems negative except as listed in HPI, PMH and Problem List.  Past Medical History  Diagnosis Date  . 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 Kidney stones     recurrent since 1989  . Endometrial polyp   . PONV (postoperative nausea and vomiting)     Current Outpatient Prescriptions  Medication Sig Dispense Refill  . carvedilol (COREG) 6.25 MG tablet Take 1 tablet (6.25 mg total) by mouth 2 (two) times daily with a meal.  60 tablet  6  . clonazePAM (KLONOPIN) 0.5 MG tablet Take 0.5 tablets (0.25 mg total) by mouth 2 (two) times daily.  60 tablet  5  . digoxin (LANOXIN) 0.25 MG tablet Take 1 tablet (0.25 mg total) by mouth daily.  30 tablet  6  . furosemide (LASIX) 20 MG tablet Take 1 tablet (20 mg total) by mouth daily as needed (If weight increases 3 pounds in 24 hours).  30 tablet  6  . lisinopril (PRINIVIL,ZESTRIL) 10 MG tablet Take 1 tablet (10  mg total) by mouth 2 (two) times daily.  60 tablet  6  . SODIUM CITRATE PO Take by mouth 2 (two) times daily.        Marland Kitchen spironolactone (ALDACTONE) 25 MG tablet Take 1 tablet (25 mg total) by mouth daily.  30 tablet  6     PHYSICAL EXAM: Filed Vitals:   03/06/11 1325  BP: 104/62  Pulse: 86  Wt 155  Weight change:  General:  Well appearing. No resp difficulty HEENT: normal Neck: supple. JVP flat. Carotids 2+ bilaterally; no bruits. No lymphadenopathy or thryomegaly appreciated. Cor: PMI normal. Regular rate & rhythm. No S3.  Trivial TR murmur.   Lungs: clear Abdomen: soft, nontender, nondistended. No hepatosplenomegaly. No bruits or masses. Good bowel sounds. Extremities: no cyanosis, clubbing, rash, edema Neuro: alert & orientedx3, cranial nerves grossly intact. Moves all 4 extremities w/o difficulty. Affect pleasant.    ASSESSMENT & PLAN:

## 2011-03-06 NOTE — Patient Instructions (Addendum)
Increase Coreg 9.375 mg (1.5 tabs) at night for two days then increase the morning dose to 1.5 tabs as well.    Continue daily weights and as needed lasix.    Follow up 2 weeks for further medication titration.

## 2011-03-07 ENCOUNTER — Other Ambulatory Visit: Payer: Self-pay | Admitting: Oncology

## 2011-03-07 DIAGNOSIS — C787 Secondary malignant neoplasm of liver and intrahepatic bile duct: Secondary | ICD-10-CM

## 2011-03-09 ENCOUNTER — Telehealth: Payer: Self-pay | Admitting: Oncology

## 2011-03-09 NOTE — Telephone Encounter (Signed)
called pts lmovm for pet scan on 03/20/2011 @ 10:30am.  asked pt to rtn call to confirm appt

## 2011-03-10 ENCOUNTER — Ambulatory Visit: Payer: BC Managed Care – PPO

## 2011-03-10 ENCOUNTER — Other Ambulatory Visit: Payer: BC Managed Care – PPO | Admitting: Lab

## 2011-03-10 NOTE — Progress Notes (Signed)
Patient seen and examined with Nicki Bradley PA-C. We discussed all aspects of the encounter. I agree with the assessment and plan as stated above.   

## 2011-03-13 ENCOUNTER — Other Ambulatory Visit: Payer: Self-pay | Admitting: *Deleted

## 2011-03-15 ENCOUNTER — Other Ambulatory Visit: Payer: Self-pay | Admitting: *Deleted

## 2011-03-15 ENCOUNTER — Ambulatory Visit: Payer: BC Managed Care – PPO

## 2011-03-16 ENCOUNTER — Other Ambulatory Visit: Payer: Self-pay | Admitting: Physician Assistant

## 2011-03-17 ENCOUNTER — Other Ambulatory Visit: Payer: BC Managed Care – PPO | Admitting: Lab

## 2011-03-17 ENCOUNTER — Ambulatory Visit: Payer: BC Managed Care – PPO

## 2011-03-18 NOTE — Progress Notes (Signed)
This encounter was created in error - please disregard.

## 2011-03-20 ENCOUNTER — Encounter (HOSPITAL_COMMUNITY): Payer: Self-pay

## 2011-03-20 ENCOUNTER — Encounter (HOSPITAL_COMMUNITY)
Admission: RE | Admit: 2011-03-20 | Discharge: 2011-03-20 | Disposition: A | Discharge status: Home / Self Care | Payer: BC Managed Care – PPO | Source: Ambulatory Visit | Attending: Oncology | Admitting: Oncology

## 2011-03-20 DIAGNOSIS — Z79899 Other long term (current) drug therapy: Secondary | ICD-10-CM | POA: Insufficient documentation

## 2011-03-20 DIAGNOSIS — C7951 Secondary malignant neoplasm of bone: Secondary | ICD-10-CM | POA: Insufficient documentation

## 2011-03-20 DIAGNOSIS — N201 Calculus of ureter: Secondary | ICD-10-CM | POA: Insufficient documentation

## 2011-03-20 DIAGNOSIS — C50919 Malignant neoplasm of unspecified site of unspecified female breast: Secondary | ICD-10-CM | POA: Insufficient documentation

## 2011-03-20 DIAGNOSIS — N1330 Unspecified hydronephrosis: Secondary | ICD-10-CM | POA: Insufficient documentation

## 2011-03-20 DIAGNOSIS — C787 Secondary malignant neoplasm of liver and intrahepatic bile duct: Secondary | ICD-10-CM | POA: Insufficient documentation

## 2011-03-20 DIAGNOSIS — N200 Calculus of kidney: Secondary | ICD-10-CM | POA: Insufficient documentation

## 2011-03-20 MED ORDER — FLUDEOXYGLUCOSE F - 18 (FDG) INJECTION: 16.9000 | Freq: Once | INTRAVENOUS | Status: AC | PRN

## 2011-03-21 LAB — GLUCOSE, CAPILLARY: Glucose-Capillary: 95 mg/dL (ref 70–99)

## 2011-03-22 ENCOUNTER — Ambulatory Visit (HOSPITAL_BASED_OUTPATIENT_CLINIC_OR_DEPARTMENT_OTHER): Payer: BC Managed Care – PPO

## 2011-03-22 ENCOUNTER — Ambulatory Visit: Payer: BC Managed Care – PPO

## 2011-03-22 ENCOUNTER — Other Ambulatory Visit: Payer: Self-pay | Admitting: *Deleted

## 2011-03-22 ENCOUNTER — Telehealth: Payer: Self-pay | Admitting: *Deleted

## 2011-03-22 ENCOUNTER — Other Ambulatory Visit: Payer: Self-pay | Admitting: Oncology

## 2011-03-22 ENCOUNTER — Encounter: Payer: Self-pay | Admitting: *Deleted

## 2011-03-22 DIAGNOSIS — C50919 Malignant neoplasm of unspecified site of unspecified female breast: Secondary | ICD-10-CM

## 2011-03-22 DIAGNOSIS — I959 Hypotension, unspecified: Secondary | ICD-10-CM

## 2011-03-22 DIAGNOSIS — E876 Hypokalemia: Secondary | ICD-10-CM

## 2011-03-22 LAB — COMPREHENSIVE METABOLIC PANEL
ALT: 24 U/L (ref 0–35)
AST: 28 U/L (ref 0–37)
Albumin: 4.3 g/dL (ref 3.5–5.2)
Alkaline Phosphatase: 102 U/L (ref 39–117)
Glucose, Bld: 109 mg/dL — ABNORMAL HIGH (ref 70–99)
Potassium: 4.2 mEq/L (ref 3.5–5.3)
Sodium: 137 mEq/L (ref 135–145)
Total Bilirubin: 0.5 mg/dL (ref 0.3–1.2)
Total Protein: 8.7 g/dL — ABNORMAL HIGH (ref 6.0–8.3)

## 2011-03-22 MED ORDER — SODIUM CHLORIDE 0.9 % IV SOLN
INTRAVENOUS | Status: DC
  Administered 2011-03-22: 15:00:00 via INTRAVENOUS

## 2011-03-22 MED ORDER — PROMETHAZINE HCL 25 MG/ML IJ SOLN
12.5000 mg | Freq: Four times a day (QID) | INTRAMUSCULAR | Status: DC | PRN
  Administered 2011-03-22: 15:00:00 via INTRAVENOUS
  Filled 2011-03-22: qty 1

## 2011-03-22 NOTE — Progress Notes (Signed)
Kathy Jimenez is here for an unscheduled visit. She has been complaining of nausea and having episodes of diarrhea. Since been going on for a few days. She also remarked some some flank pain on the right. Of note is a recent PET scan did show that she did have a 4 mm ureteric stone with resultant hydronephrosis on the right side. I did do lab work today and this did show had a slightly elevated creatinine 1.5 with a normal potassium of 4.2. I did give her IV fluids and some antiemetics by IV. I reviewed her case with Dr. Jacquelyne Balint and his thoughts. I am concerned about the fact that she does have recurrent stones and the fact many of a permanent stent in place. This is especially since she may have some stenosis of ureter and right side. In view of her cardiomyopathy and also her ongoing need for chemotherapy would be best to optimize her renal function. She sees Dr.Bensihmon  tomorrow for a repeat 2-D echo. Also see the urologist as well. She will a lot with [place] on Friday for further treatment as well. I did review her PET scan with her which does show some slight progression in her liver. In addition her LDH is now elevated as well. I have discussed this at some length with her and have told her that she would likely require chemotherapy with or without Herceptin. She seems to be amenable. Once again I will see her again in followup on Friday.

## 2011-03-22 NOTE — Telephone Encounter (Signed)
per message from nurse placed patient on the walk in list for the lab and the nurse will give the patient fluids

## 2011-03-23 ENCOUNTER — Ambulatory Visit (HOSPITAL_COMMUNITY)
Admission: RE | Admit: 2011-03-23 | Discharge: 2011-03-23 | Disposition: A | Discharge status: Home / Self Care | Payer: BC Managed Care – PPO | Source: Ambulatory Visit | Attending: Adult Health | Admitting: Adult Health

## 2011-03-23 ENCOUNTER — Ambulatory Visit (HOSPITAL_COMMUNITY)
Admission: RE | Admit: 2011-03-23 | Discharge: 2011-03-23 | Disposition: A | Discharge status: Home / Self Care | Payer: BC Managed Care – PPO | Source: Ambulatory Visit | Attending: Internal Medicine | Admitting: Internal Medicine

## 2011-03-23 VITALS — BP 90/52 | HR 82 | Wt 149.0 lb

## 2011-03-23 DIAGNOSIS — I369 Nonrheumatic tricuspid valve disorder, unspecified: Secondary | ICD-10-CM

## 2011-03-23 DIAGNOSIS — I5021 Acute systolic (congestive) heart failure: Secondary | ICD-10-CM | POA: Insufficient documentation

## 2011-03-23 DIAGNOSIS — C50919 Malignant neoplasm of unspecified site of unspecified female breast: Secondary | ICD-10-CM

## 2011-03-23 DIAGNOSIS — C787 Secondary malignant neoplasm of liver and intrahepatic bile duct: Secondary | ICD-10-CM

## 2011-03-23 DIAGNOSIS — I5022 Chronic systolic (congestive) heart failure: Secondary | ICD-10-CM

## 2011-03-23 NOTE — Assessment & Plan Note (Addendum)
Volume status looks good today.  NYHA II.  Echo from today reviewed and it shows mild improvement in her EF to ~25%.  Main issue is N/V/D which seems to be a combination of kidney stone and starting tykerb.  She is hypotensive today so we will not titrate any medications at this time.  She see urology today for her kidney stone, will have to be careful if fluids are given with her depressed EF.  Have suggested she take leave from work until she is able to build up more strength.  She is agreeable to this and Dr. Gala Romney has discussed this with her employer.    Patient seen and examined with Ulyess Blossom PA-C. We discussed all aspects of the encounter. I agree with the assessment and plan as stated above. PET scan apparently with progressive disease. EF slightly better on ECHO but still depressed. She feels poorly today and BP is low. Suspect she may need some IVF. Will check labs and f/u after urology visit. Long talk about need to stay out of work and speed recovery. Have to continue to hold Herceptin for now. D/w Dr. Donnie Coffin and her employer.   Follow up in 2 weeks for possible medication titration, follow up echo in 2 months.

## 2011-03-23 NOTE — Progress Notes (Signed)
HPI:  [name] is a 40 y.o. female with L breast CA in 2004 s/p lumpectomy and treatment with FEC (flourouracil, epirubicin, cyclophosphamide). Also treated with hormonal therapy. 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  Started on weekly herceptin. Recently also started on Perjeta and Tykerb. Dr. Donnie Coffin evaluated her for a cough and progressive DOE; CT which showed dilated heart with pleural effusion and echo with EF 25% with global HK. Mod MR/TR, lateral s' 6.1 sec/m. Admitted to the hospital for volume overload and tachycardia.  Diuresed 9 lbs, discharge weight 151 lbs.  With history of severe kidney stones, lasix 20 mg given prn.    She returns today for follow up.  Coreg increased 9.375 mg BID last visit, she tolerated this well.  She was restarted on Tycerb last Monday.  This week she has had N/V/D and just feels poorly.  She has missed 2 days of work.  She was seen by Dr. Donnie Coffin yesterday and felt it was not an infectious process and given ~1L of fluid at the visit.  To see urology today (Dr. Perley Jain) for kidney stone found on recent PET scan.  She continues to feel bad today.  Ne fever/chills.  No SOB.  No orthopnea/PND/dizziness or syncope.  BP soft today but asymptomatic.  No edema.  Weight a steady 151 at home, she has not take any lasix since last visit.  Preliminary Echo reviewed and EF mildly improved 25%, lateral s' 6.4 sec/m, LVIDd improved from 4.9 to 3.4.    ROS: All systems negative except as listed in HPI, PMH and Problem List.  Past Medical History  Diagnosis Date  . 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 Kidney stones     recurrent since 1989  . Endometrial polyp   . PONV (postoperative nausea and vomiting)     Current Outpatient Prescriptions  Medication Sig Dispense Refill  . carvedilol (COREG) 6.25 MG tablet Take 1.5 tablets (9.375 mg  total) by mouth 2 (two) times daily with a meal.  60 tablet  6  . digoxin (LANOXIN) 0.25 MG tablet Take 1 tablet (0.25 mg total) by mouth daily.  30 tablet  6  . furosemide (LASIX) 20 MG tablet Take 1 tablet (20 mg total) by mouth daily as needed (If weight increases 3 pounds in 24 hours).  30 tablet  6  . lapatinib (TYKERB) 250 MG tablet Take 1,250 mg by mouth daily.        Marland Kitchen lisinopril (PRINIVIL,ZESTRIL) 10 MG tablet Take 1 tablet (10 mg total) by mouth 2 (two) times daily.  60 tablet  6  . SODIUM CITRATE PO Take by mouth 2 (two) times daily.        Marland Kitchen spironolactone (ALDACTONE) 25 MG tablet Take 1 tablet (25 mg total) by mouth daily.  30 tablet  6   No current facility-administered medications for this encounter.   Facility-Administered Medications Ordered in Other Encounters  Medication Dose Route Frequency Provider Last Rate Last Dose  . DISCONTD: 0.9 %  sodium chloride infusion   Intravenous Continuous Pierce Crane, MD      . DISCONTD: promethazine (PHENERGAN) injection 12.5 mg  12.5 mg Intravenous Q6H PRN Pierce Crane, MD         PHYSICAL EXAM: Filed Vitals:   03/23/11 1024  BP: 90/52  Pulse: 82  Wt 149  General:  Well appearing. No resp difficulty HEENT: normal Neck: supple. JVP flat. Carotids 2+ bilaterally; no bruits. No lymphadenopathy or thryomegaly appreciated. Cor: PMI normal. Regular rate & rhythm. No S3.  Trivial TR murmur.   Lungs: clear Abdomen: soft, nontender, nondistended. No hepatosplenomegaly. No bruits or masses. Good bowel sounds. Extremities: no cyanosis, clubbing, rash, edema Neuro: alert & orientedx3, cranial nerves grossly intact. Moves all 4 extremities w/o difficulty. Affect pleasant.    ASSESSMENT & PLAN:

## 2011-03-23 NOTE — Progress Notes (Signed)
*  PRELIMINARY RESULTS* Echocardiogram 2D Echocardiogram has been performed.  Kathy Jimenez 03/23/2011, 10:31 AM

## 2011-03-24 ENCOUNTER — Other Ambulatory Visit: Payer: Self-pay | Admitting: Oncology

## 2011-03-24 ENCOUNTER — Other Ambulatory Visit: Payer: BC Managed Care – PPO | Admitting: Lab

## 2011-03-24 ENCOUNTER — Other Ambulatory Visit: Payer: Self-pay | Admitting: Physician Assistant

## 2011-03-24 ENCOUNTER — Ambulatory Visit (HOSPITAL_COMMUNITY)
Admission: RE | Admit: 2011-03-24 | Discharge: 2011-03-24 | Disposition: A | Discharge status: Home / Self Care | Payer: BC Managed Care – PPO | Source: Ambulatory Visit | Attending: Urology | Admitting: Urology

## 2011-03-24 ENCOUNTER — Encounter (HOSPITAL_COMMUNITY): Payer: Self-pay | Admitting: Anesthesiology

## 2011-03-24 ENCOUNTER — Encounter (HOSPITAL_COMMUNITY): Payer: Self-pay | Admitting: *Deleted

## 2011-03-24 ENCOUNTER — Other Ambulatory Visit: Payer: Self-pay | Admitting: Urology

## 2011-03-24 ENCOUNTER — Encounter (HOSPITAL_COMMUNITY)
Admission: RE | Disposition: A | Discharge status: Home / Self Care | Payer: Self-pay | Source: Ambulatory Visit | Attending: Urology

## 2011-03-24 ENCOUNTER — Other Ambulatory Visit: Payer: Self-pay | Admitting: *Deleted

## 2011-03-24 ENCOUNTER — Ambulatory Visit: Payer: BC Managed Care – PPO

## 2011-03-24 ENCOUNTER — Ambulatory Visit (HOSPITAL_COMMUNITY): Payer: BC Managed Care – PPO | Admitting: Anesthesiology

## 2011-03-24 ENCOUNTER — Telehealth: Payer: Self-pay | Admitting: *Deleted

## 2011-03-24 DIAGNOSIS — N1330 Unspecified hydronephrosis: Secondary | ICD-10-CM | POA: Insufficient documentation

## 2011-03-24 DIAGNOSIS — N135 Crossing vessel and stricture of ureter without hydronephrosis: Secondary | ICD-10-CM | POA: Insufficient documentation

## 2011-03-24 DIAGNOSIS — Z853 Personal history of malignant neoplasm of breast: Secondary | ICD-10-CM | POA: Insufficient documentation

## 2011-03-24 DIAGNOSIS — R112 Nausea with vomiting, unspecified: Secondary | ICD-10-CM | POA: Insufficient documentation

## 2011-03-24 DIAGNOSIS — E876 Hypokalemia: Secondary | ICD-10-CM

## 2011-03-24 DIAGNOSIS — Z79899 Other long term (current) drug therapy: Secondary | ICD-10-CM | POA: Insufficient documentation

## 2011-03-24 DIAGNOSIS — N201 Calculus of ureter: Secondary | ICD-10-CM | POA: Insufficient documentation

## 2011-03-24 DIAGNOSIS — I5021 Acute systolic (congestive) heart failure: Secondary | ICD-10-CM

## 2011-03-24 HISTORY — DX: Heart failure, unspecified: I50.9

## 2011-03-24 HISTORY — PX: CYSTOSCOPY WITH URETEROSCOPY: SHX5123

## 2011-03-24 LAB — BASIC METABOLIC PANEL
CO2: 21 mEq/L (ref 19–32)
Calcium: 10.1 mg/dL (ref 8.4–10.5)
Chloride: 100 mEq/L (ref 96–112)
Creatinine, Ser: 2.3 mg/dL — ABNORMAL HIGH (ref 0.50–1.10)
Sodium: 134 mEq/L — ABNORMAL LOW (ref 135–145)

## 2011-03-24 LAB — SURGICAL PCR SCREEN
MRSA, PCR: NEGATIVE
Staphylococcus aureus: NEGATIVE

## 2011-03-24 SURGERY — CYSTOSCOPY WITH URETEROSCOPY
Anesthesia: General | Site: Ureter | Laterality: Right | Wound class: Clean Contaminated

## 2011-03-24 MED ORDER — PROMETHAZINE HCL 25 MG/ML IJ SOLN: 6.2500 mg | INTRAMUSCULAR | Status: DC | PRN

## 2011-03-24 MED ORDER — LACTATED RINGERS IV SOLN: INTRAVENOUS | Status: DC

## 2011-03-24 MED ORDER — ONDANSETRON HCL 4 MG/2ML IJ SOLN
INTRAMUSCULAR | Status: DC | PRN
  Administered 2011-03-24: 4 mg via INTRAVENOUS

## 2011-03-24 MED ORDER — SUCCINYLCHOLINE CHLORIDE 20 MG/ML IJ SOLN
INTRAMUSCULAR | Status: DC | PRN
  Administered 2011-03-24: 100 mg via INTRAVENOUS

## 2011-03-24 MED ORDER — IOHEXOL 300 MG/ML  SOLN
INTRAMUSCULAR | Status: AC
  Filled 2011-03-24: qty 1

## 2011-03-24 MED ORDER — SCOPOLAMINE 1 MG/3DAYS TD PT72
MEDICATED_PATCH | TRANSDERMAL | Status: AC
  Filled 2011-03-24: qty 1

## 2011-03-24 MED ORDER — MIDAZOLAM HCL 5 MG/5ML IJ SOLN
INTRAMUSCULAR | Status: DC | PRN
  Administered 2011-03-24 (×2): 1 mg via INTRAVENOUS

## 2011-03-24 MED ORDER — LACTATED RINGERS IV SOLN
INTRAVENOUS | Status: DC | PRN
  Administered 2011-03-24: 16:00:00 via INTRAVENOUS

## 2011-03-24 MED ORDER — MUPIROCIN 2 % EX OINT
TOPICAL_OINTMENT | CUTANEOUS | Status: AC
  Filled 2011-03-24: qty 22

## 2011-03-24 MED ORDER — CIPROFLOXACIN IN D5W 400 MG/200ML IV SOLN
INTRAVENOUS | Status: DC | PRN
  Administered 2011-03-24: 200 mg via INTRAVENOUS

## 2011-03-24 MED ORDER — PHENAZOPYRIDINE HCL 200 MG PO TABS: 200.0000 mg | ORAL_TABLET | Freq: Once | ORAL | Status: DC

## 2011-03-24 MED ORDER — PROPOFOL 10 MG/ML IV BOLUS
INTRAVENOUS | Status: DC | PRN
  Administered 2011-03-24: 120 mg via INTRAVENOUS

## 2011-03-24 MED ORDER — DEXAMETHASONE SODIUM PHOSPHATE 4 MG/ML IJ SOLN
INTRAMUSCULAR | Status: DC | PRN
  Administered 2011-03-24: 4 mg via INTRAVENOUS

## 2011-03-24 MED ORDER — FENTANYL CITRATE 0.05 MG/ML IJ SOLN: 25.0000 ug | INTRAMUSCULAR | Status: DC | PRN

## 2011-03-24 MED ORDER — ONDANSETRON HCL 4 MG/2ML IJ SOLN
4.0000 mg | Freq: Four times a day (QID) | INTRAMUSCULAR | Status: DC | PRN
  Administered 2011-03-24: 4 mg via INTRAVENOUS

## 2011-03-24 MED ORDER — ONDANSETRON HCL 4 MG/2ML IJ SOLN
INTRAMUSCULAR | Status: AC
  Filled 2011-03-24: qty 2

## 2011-03-24 MED ORDER — SODIUM CHLORIDE 0.9 % IR SOLN
Status: DC | PRN
  Administered 2011-03-24: 3000 mL

## 2011-03-24 MED ORDER — CIPROFLOXACIN IN D5W 200 MG/100ML IV SOLN
200.0000 mg | Freq: Two times a day (BID) | INTRAVENOUS | Status: DC
  Filled 2011-03-24: qty 100

## 2011-03-24 MED ORDER — FENTANYL CITRATE 0.05 MG/ML IJ SOLN
INTRAMUSCULAR | Status: DC | PRN
  Administered 2011-03-24 (×5): 50 ug via INTRAVENOUS

## 2011-03-24 MED ORDER — IOHEXOL 300 MG/ML  SOLN
INTRAMUSCULAR | Status: DC | PRN
  Administered 2011-03-24: 17 mL via INTRAVENOUS

## 2011-03-24 MED ORDER — SCOPOLAMINE 1 MG/3DAYS TD PT72
MEDICATED_PATCH | TRANSDERMAL | Status: DC | PRN
  Administered 2011-03-24: 1.5 mg via TRANSDERMAL

## 2011-03-24 MED ORDER — LIDOCAINE HCL (CARDIAC) 20 MG/ML IV SOLN
INTRAVENOUS | Status: DC | PRN
  Administered 2011-03-24: 60 mg via INTRAVENOUS

## 2011-03-24 SURGICAL SUPPLY — 18 items
ADAPTER CATH URET PLST 4-6FR (CATHETERS) ×3 IMPLANT
BAG URO CATCHER STRL LF (DRAPE) ×3 IMPLANT
CATH INTERMIT  6FR 70CM (CATHETERS) ×3 IMPLANT
CATH URET 5FR 28IN CONE TIP (BALLOONS) ×1
CATH URET 5FR 28IN OPEN ENDED (CATHETERS) ×3 IMPLANT
CATH URET 5FR 70CM CONE TIP (BALLOONS) ×2 IMPLANT
CLOTH BEACON ORANGE TIMEOUT ST (SAFETY) ×3 IMPLANT
DRAPE CAMERA CLOSED 9X96 (DRAPES) ×3 IMPLANT
GLOVE BIOGEL M 8.0 STRL (GLOVE) ×3 IMPLANT
GLOVE SURG SS PI 8.0 STRL IVOR (GLOVE) ×3 IMPLANT
GOWN PREVENTION PLUS XLARGE (GOWN DISPOSABLE) ×3 IMPLANT
GOWN STRL REIN XL XLG (GOWN DISPOSABLE) ×3 IMPLANT
GUIDEWIRE STR DUAL SENSOR (WIRE) ×3 IMPLANT
MANIFOLD NEPTUNE II (INSTRUMENTS) ×3 IMPLANT
MARKER SKIN DUAL TIP RULER LAB (MISCELLANEOUS) ×3 IMPLANT
PACK CYSTO (CUSTOM PROCEDURE TRAY) ×3 IMPLANT
SHEATH URET ACCESS 12FR/35CM (UROLOGICAL SUPPLIES) ×3 IMPLANT
TUBING CONNECTING 10 (TUBING) ×3 IMPLANT

## 2011-03-24 NOTE — Anesthesia Postprocedure Evaluation (Signed)
  Anesthesia Post-op Note  Patient: Kathy Jimenez  Procedure(s) Performed:  CYSTOSCOPY WITH URETEROSCOPY - dilitation of ureteral stricture  Patient Location: PACU  Anesthesia Type: General  Level of Consciousness: awake and alert   Airway and Oxygen Therapy: Patient Spontanous Breathing  Post-op Pain: mild  Post-op Assessment: Post-op Vital signs reviewed, Patient's Cardiovascular Status Stable, Respiratory Function Stable, Patent Airway and No signs of Nausea or vomiting  Post-op Vital Signs: stable  Complications: No apparent anesthesia complications

## 2011-03-24 NOTE — Anesthesia Preprocedure Evaluation (Addendum)
Anesthesia Evaluation  Patient identified by MRN, date of birth, ID band Patient awake    Reviewed: Allergy & Precautions, H&P , NPO status , Patient's Chart, lab work & pertinent test results  History of Anesthesia Complications (+) PONV  Airway Mallampati: II TM Distance: >3 FB Neck ROM: Full    Dental No notable dental hx.    Pulmonary neg pulmonary ROS,  clear to auscultation  Pulmonary exam normal       Cardiovascular +CHF and neg cardio ROS Regular Normal Echo: EF 25%. Up from 15%. She had to stop chemotherapy in order to improve her heart function   Neuro/Psych Negative Neurological ROS  Negative Psych ROS   GI/Hepatic negative GI ROS, Neg liver ROS, Liver metastases   Endo/Other  Negative Endocrine ROS  Renal/GU negative Renal ROS  Genitourinary negative   Musculoskeletal negative musculoskeletal ROS (+)   Abdominal   Peds negative pediatric ROS (+)  Hematology negative hematology ROS (+)   Anesthesia Other Findings   Reproductive/Obstetrics negative OB ROS                          Anesthesia Physical Anesthesia Plan  ASA: III  Anesthesia Plan: General   Post-op Pain Management:    Induction: Intravenous  Airway Management Planned: LMA  Additional Equipment:   Intra-op Plan:   Post-operative Plan: Extubation in OR  Informed Consent: I have reviewed the patients History and Physical, chart, labs and discussed the procedure including the risks, benefits and alternatives for the proposed anesthesia with the patient or authorized representative who has indicated his/her understanding and acceptance.   Dental advisory given  Plan Discussed with: CRNA  Anesthesia Plan Comments: (Nauseated today. Has used scopolamine patch in the past with success)       Anesthesia Quick Evaluation

## 2011-03-24 NOTE — Telephone Encounter (Signed)
placed note in the edit notes to inform the lab that extra labs have been order for the patient on 03-24-2011

## 2011-03-24 NOTE — H&P (Signed)
History of Present Illness   Kathy Jimenez was assessed for a distal right ureteral stone acutely 2 days ago. She had nausea. She was sent home with appropriate medical therapy and surveillance.  I remember Dr. Donnie Coffin telling me her baseline serum creatinine was usually around 1 or 1.1. She had lots of reasons to have a prerenal cause of an elevation when I saw her the other day and apparently it may have been 1.6 or 1.8. He called me today and her serum creatinine by memory I believe was 2.2 or 2.3.   Kathy Jimenez has no fever. She came in and was having nausea and now some vomiting. She feels terrible. She is not having pain.   Review of systems: No change in bowel or neurologic status.   There is no other modifying factors or associated signs or symptoms. There is no other aggravating or relieving factors. The symptoms are moderate in severity and persistent.   On physical examination she did not look toxic. She did not have CVA tenderness.      Past Medical History Problems  1. History of  Abdominal Pain In The Left Lower Belly (LLQ) 789.04 2. History of  Abdominal Pain In The Right Lower Belly (RLQ) 789.03 3. History of  Breast Cancer V10.3 4. History of  Gross Hematuria 599.71 5. History of  Hematuria 599.70 6. History of  Microscopic Hematuria 599.72 7. History of  Ureteral Stone 592.1 8. 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 History of  Cystoscopy With Insertion Of Ureteral Stent Right 2. History of  Cystoscopy With Insertion Of Ureteral Stent Right 3. History of  Cystoscopy With Ureteroscopy Right 4. History of  Cystoscopy With Ureteroscopy With Lithotripsy  Current Meds 1. Carvedilol 6.25 MG Oral Tablet; Therapy: 19Nov2012 to 2. Digoxin 0.25 MG Oral Tablet; Therapy: 19Nov2012 to 3. Furosemide 20 MG Oral Tablet; Therapy: 19Nov2012 to 4. Lisinopril 10 MG Oral Tablet; Therapy: 19Nov2012 to 5. Tykerb 250 MG Oral Tablet; Therapy: 04Oct2012  to  Allergies Medication  1. Sulfa Drugs  Family History Problems  1. Family history of  Family Health Status - Father's Age Age 75 2. Family history of  Family Health Status - Mother's Age Age 62 3. Family history of  Family Health Status Children ___ Living Daughters 1  Social History Problems  1. Alcohol Use 1 time per week 2. Caffeine Use 1 time per week 3. Marital History - Divorced V61.0 4. Never A Smoker 5. Occupation: HR Director Denied  6. History of  Tobacco Use V15.82  Vitals Vital Signs [Data Includes: Last 1 Day]  14Dec2012 01:49PM  Blood Pressure: 95 / 60 Temperature: 97.9 F Heart Rate: 99 13Dec2012 03:22PM  BMI Calculated: 25.44 BSA Calculated: 1.73 Height: 5 ft 4 in Weight: 149 lb  Blood Pressure: 117 / 79 Temperature: 98 F  Physical Exam: General appearance: alert and appears stated age Head: Normocephalic, without obvious abnormality, atraumatic Eyes: conjunctivae/corneas clear. EOM's intact.  Oropharynx: moist mucous membranes Neck: supple, symmetrical, trachea midline Resp: normal respiratory effort Cardio: regular rate and rhythm Back: symmetric, no curvature. ROM normal. No CVA tenderness. GI: soft, non-tender; bowel sounds normal; no masses,  no organomegaly GU: normal external genitalia, vulva, vagina, cervix, uterus and adnexa Pelvic: deferred Extremities: extremities normal, atraumatic, no cyanosis or edema Skin: Skin color normal. No visible rashes or lesions Neurologic: Grossly normal  Assessed  1. Hydroureteronephrosis On The Right 591 2. Ureteral Stone 592.1  Plan Ureteral Stone (  592.1)  1. KUB  Requested for: 14Dec2012  Discussion/Summary  Kathy Jimenez had a KUB and I could not see the stone though the quality of it was good.   I drew her a picture. She understands that she probably still has a stone and although we mentioned it, I do not think she should have another CT urogram. I think under the circumstances and her  past history, she probably still has a stone. She has had a lot of "less than ideal experiences with stones, especially with [facility]".   Today we discussed a cystoscopy and retrograde ureterogram plus or minus ureteroscopy plus or minus stent. She understands the first goal is to drain her kidneys. Pros and cons and risks were discussed. She said she does not do that well with stents but has had them on many occasions. She had an appointment to see Dr. Bryson Ha on Monday. Pros and cons and risks were discussed. I will speak to Dr. Vernie Ammons.

## 2011-03-24 NOTE — Op Note (Signed)
Preoperative diagnosis:  1. History of right ureteral stone. 2. Right hydronephrosis. 3. History of right renal structure  Postoperative diagnosis:  1. Passed right ureteral stone. 2. Right proximal hydronephrosis. 3. Right ureteral stricture  Procedure:  1. Cystoscopy 2. Right retrograde pyelography with interpretation 3. Right ureteroscopy and dilatation of a ureteral stricture  Surgeon: Loraine Leriche C. Vernie Ammons, M.D.  Anesthesia: General  Complications: None  EBL: Minimal  Specimens: None  Indication: This patient has a history overweight ureteral calculus. She has been having nausea but has not been having any flank pain. She does have a history of stones in also a history overweight ureteral stricture. A CT scan was discussed with the patient as an option but she elected to proceed with ureteroscopic evaluation in order to remove any stone in found at that time. Her After reviewing the management options for treatment, they have elected to proceed with the above surgical procedure(s). We have discussed the potential benefits and risks of the procedure, side effects of the proposed treatment, the likelihood of the patient achieving the goals of the procedure, and any potential problems that might occur during the procedure or recuperation. Informed consent has been obtained.  Description of procedure:  The patient was taken to the operating room and general anesthesia was induced.  The patient was placed in the dorsal lithotomy position, prepped and draped in the usual sterile fashion, and preoperative antibiotics were administered. A preoperative time-out was performed.   Cystourethroscopy was performed.  The patient's urethra was examined and was normal. The bladder was then systematically examined in its entirety. There was no evidence for any bladder tumors, stones, or other mucosal pathology.    Attention then turned to the right ureteral orifice and a 6 [place] open-ended ureteral  catheter was used to intubate the ureteral orifice.  Omnipaque contrast was injected through the ureteral catheter and a retrograde pyelogram was performed. This revealed a normal distal urethra with what appeared to be a narrowing at the level of the bottom edge of the sacrum. The ureter proximally this appeared dilated end the collecting system also had very mild blunting of the calyces and what appeared to be dilatation of the interrenal collecting system. I could not say for sure that there was no filling defect at the bottom of the sacrum where there was apparent stricture.  A 0.38 sensor guidewire was then advanced up the right ureter into the renal pelvis under fluoroscopic guidance. I passed the digital, flexible ureteroscope over the guidewire and up the right ureter to the area that I could see as possible stricture on retrograde pyelogram. This revealed a mild stricture. I passed the guidewire through the ureteroscope back through this and remove ureteroscope. I left the guidewire in place and passed a ureteral access sheath over the guidewire and this easily passed through the intramural ureter in with gentle pressure I was able to advance it through the area where the stricture was located. I removed the guidewire and the inner portion of the access sheath and then passed the digital ureteroscope through the access sheath and on up the ureter and easily was able to pass this into the renal pelvis. I then did a systematic inspection of each calyx and found no tumors or stones. I then withdrew the ureteroscope down the ureter under direct vision and noted no stones. The area where the stricture was previously located was photographed and then continued to advance the scope on out and noted no stones. I then drained  the bladder and the patient was awakened and taken to recovery room in stable and satisfactory condition there she tolerated the procedure well and there were no intraoperative complications.  Because of the gentle dilatation of the structure a did not feel stent was necessary especially since the patient has not tolerated stents well in the past.

## 2011-03-24 NOTE — Anesthesia Procedure Notes (Signed)
Procedure Name: Intubation Date/Time: 03/24/2011 7:15 PM Performed by: Hulan Fess Pre-anesthesia Checklist: Patient identified, Emergency Drugs available, Suction available, Patient being monitored and Timeout performed Patient Re-evaluated:Patient Re-evaluated prior to inductionOxygen Delivery Method: Circle System Utilized Preoxygenation: Pre-oxygenation with 100% oxygen Intubation Type: IV induction Ventilation: Mask ventilation without difficulty Laryngoscope Size: 3 Grade View: Grade I Tube type: Oral Tube size: 8.0 mm Number of attempts: 1 Placement Confirmation: ETT inserted through vocal cords under direct vision,  positive ETCO2 and breath sounds checked- equal and bilateral Secured at: 2.5 cm Tube secured with: Tape Dental Injury: Teeth and Oropharynx as per pre-operative assessment

## 2011-03-24 NOTE — Transfer of Care (Signed)
Immediate Anesthesia Transfer of Care Note  Patient: Kathy Jimenez  Procedure(s) Performed:  CYSTOSCOPY WITH URETEROSCOPY - dilitation of ureteral stricture  Patient Location: PACU  Anesthesia Type: General  Level of Consciousness: awake, alert  and oriented  Airway & Oxygen Therapy: Patient Spontanous Breathing and Patient connected to face mask oxygen  Post-op Assessment: Report given to PACU RN  Post vital signs: Reviewed and stable  Complications: No apparent anesthesia complications

## 2011-03-25 ENCOUNTER — Ambulatory Visit: Payer: BC Managed Care – PPO

## 2011-03-28 ENCOUNTER — Other Ambulatory Visit: Payer: Self-pay | Admitting: Oncology

## 2011-03-28 DIAGNOSIS — C50919 Malignant neoplasm of unspecified site of unspecified female breast: Secondary | ICD-10-CM

## 2011-03-28 MED ORDER — CAPECITABINE 500 MG PO TABS: 625.0000 mg/m2 | ORAL_TABLET | Freq: Two times a day (BID) | ORAL | Status: AC

## 2011-03-31 ENCOUNTER — Encounter (HOSPITAL_COMMUNITY): Payer: Self-pay | Admitting: Urology

## 2011-03-31 ENCOUNTER — Other Ambulatory Visit: Payer: Self-pay | Admitting: Oncology

## 2011-04-06 ENCOUNTER — Telehealth: Payer: Self-pay | Admitting: Oncology

## 2011-04-06 ENCOUNTER — Ambulatory Visit (HOSPITAL_COMMUNITY)
Admission: RE | Admit: 2011-04-06 | Discharge: 2011-04-06 | Disposition: A | Discharge status: Home / Self Care | Payer: BC Managed Care – PPO | Source: Ambulatory Visit | Attending: Internal Medicine | Admitting: Internal Medicine

## 2011-04-06 ENCOUNTER — Other Ambulatory Visit: Payer: Self-pay | Admitting: Oncology

## 2011-04-06 VITALS — BP 102/64 | HR 84 | Wt 147.8 lb

## 2011-04-06 DIAGNOSIS — I5022 Chronic systolic (congestive) heart failure: Secondary | ICD-10-CM | POA: Insufficient documentation

## 2011-04-06 MED ORDER — CARVEDILOL 6.25 MG PO TABS: 12.5000 mg | ORAL_TABLET | Freq: Two times a day (BID) | ORAL | Status: DC

## 2011-04-06 NOTE — Progress Notes (Signed)
HPI:  Kathy Jimenez is a 40 y.o. female with L breast CA in 2004 s/p lumpectomy and treatment with FEC (flourouracil, epirubicin, cyclophosphamide). Also treated with hormonal therapy. 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  Started on weekly herceptin. Recently also started on Perjeta and Tykerb. Dr. Donnie Coffin evaluated her for a cough and progressive DOE; CT which showed dilated heart with pleural effusion and echo with EF 25% with global HK. Mod MR/TR, lateral s' 6.1 sec/m. Admitted to the hospital for volume overload and tachycardia.  Diuresed 9 lbs, discharge weight 151 lbs.  With history of severe kidney stones, lasix 20 mg given prn.    Preliminary Echo reviewed and EF mildly improved 25%, lateral s' 6.4 sec/m, LVIDd improved from 4.9 to 3.4.   She returns for follow up today.  She has started xeloda and decreased tykerb dose she still has N/V.  She underwent cystoscopy, right ureteroscopy and dilation of a ureteral stricture by Dr. Vernie Ammons on 12/14.  She denies SOB/orthopnea/PND.  No dizziness/syncope.  No edema.  Has not needed lasix.    ROS: All systems negative except as listed in HPI, PMH and Problem List.  Past Medical History  Diagnosis Date  . 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 Kidney stones     recurrent since 1989  . Endometrial polyp   . PONV (postoperative nausea and vomiting)   . CHF (congestive heart failure)     from chemo treatment    Current Outpatient Prescriptions  Medication Sig Dispense Refill  . capecitabine (XELODA) 500 MG tablet Take 2 tablets (1,000 mg total) by mouth 2 (two) times daily after a meal.  120 tablet  2  . carvedilol (COREG) 6.25 MG tablet Take 1.5 tablets (9.375 mg total) by mouth 2 (two) times daily with a meal.  60 tablet  6  . digoxin (LANOXIN) 0.25 MG tablet Take 1 tablet (0.25 mg total) by mouth daily.  30 tablet   6  . furosemide (LASIX) 20 MG tablet Take 1 tablet (20 mg total) by mouth daily as needed (If weight increases 3 pounds in 24 hours).  30 tablet  6  . lapatinib (TYKERB) 250 MG tablet Take 1,250 mg by mouth daily.        Marland Kitchen lisinopril (PRINIVIL,ZESTRIL) 10 MG tablet Take 1 tablet (10 mg total) by mouth 2 (two) times daily.  60 tablet  6  . SODIUM CITRATE PO Take by mouth 2 (two) times daily.        Marland Kitchen spironolactone (ALDACTONE) 25 MG tablet Take 1 tablet (25 mg total) by mouth daily.  30 tablet  6     PHYSICAL EXAM: Filed Vitals:   04/06/11 1312  BP: 102/64  Pulse: 84  Wt 149  General:  Well appearing. No resp difficulty HEENT: normal Neck: supple. JVP flat. Carotids 2+ bilaterally; no bruits. No lymphadenopathy or thryomegaly appreciated. Cor: PMI normal. Regular rate & rhythm. No S3.  Trivial TR murmur.   Lungs: clear Abdomen: soft, nontender, nondistended. No hepatosplenomegaly. No bruits or masses. Good bowel sounds. Extremities: no cyanosis, clubbing, rash, edema Neuro: alert & orientedx3, cranial nerves grossly intact. Moves all 4 extremities w/o difficulty. Affect pleasant.    ASSESSMENT & PLAN:

## 2011-04-06 NOTE — Telephone Encounter (Signed)
03/29/11 Sent new Rx to [facility] with lower dose of Xeloda.  03/30/11 New Rx for Tykerb w/ lower dose to [facility].

## 2011-04-06 NOTE — Assessment & Plan Note (Addendum)
Volume status good today.  NYHA II.  Will try to titrate carvedilol 12.5 mg at night and 9.375 mg in the am with hopes to increase to 12.5 mg twice daily if tolerating night dose.  Will follow up with bedside echo in 3 weeks.    Patient seen and examined with Ulyess Blossom PA-C. We discussed all aspects of the encounter. I agree with the assessment and plan as stated above. She is doing well - asymptomatic from HF standpoint. Now back on her meds. Continue to hold Herceptin at this point (apparently receiving other agents in interim). Titrate carvedilol. Repeat echo at next visit to look for signs of LV recovery.

## 2011-04-06 NOTE — Patient Instructions (Signed)
Increase carvedilol to 12.5 mg (2 tabs) at night and keep 9.375 mg (1.5 tabs) in the morning but after several days then try to increase morning dose to 2 tabs.  Follow up in 3 weeks.    Do the following things EVERYDAY: 1) Weigh yourself in the morning before breakfast. Write it down and keep it in a log. 2) Take your medicines as prescribed 3) Eat low salt foods-Limit salt (sodium) to 2000mg  per day.  4) Stay as active as you can everyday

## 2011-04-10 ENCOUNTER — Telehealth: Payer: Self-pay | Admitting: *Deleted

## 2011-04-10 NOTE — Telephone Encounter (Signed)
Pt. Calls.  She started on xeloda on 12/26 and she has been back on her tykerb for about 2 weeks. She saw Dr. Gala Romney on 12/27 and he increased her coreg.  She has had nausea all day and has vomited about 6 times.  She can keep liquids down but nothing solid.  Her promethazine does not really help. Discussed with Dr. Welton Flakes in Dr. Renelda Loma abscence.  Pt. Needs to go to ED to be evaluated and then follow-up with Dr. Donnie Coffin.   Called pt. Back.  She is fine with this plan and will go to ED.  She appreciated our concern.  She will call back on Thursday when Dr. Donnie Coffin is back in and see what she needs to do for follow-up.

## 2011-04-11 ENCOUNTER — Other Ambulatory Visit: Payer: Self-pay | Admitting: Oncology

## 2011-04-11 DIAGNOSIS — C787 Secondary malignant neoplasm of liver and intrahepatic bile duct: Secondary | ICD-10-CM

## 2011-04-12 ENCOUNTER — Ambulatory Visit: Payer: BC Managed Care – PPO | Admitting: Oncology

## 2011-04-12 ENCOUNTER — Ambulatory Visit (HOSPITAL_BASED_OUTPATIENT_CLINIC_OR_DEPARTMENT_OTHER): Payer: BC Managed Care – PPO

## 2011-04-12 ENCOUNTER — Other Ambulatory Visit: Payer: Self-pay | Admitting: Oncology

## 2011-04-12 VITALS — BP 102/78 | HR 85 | Temp 97.7°F

## 2011-04-12 DIAGNOSIS — C50919 Malignant neoplasm of unspecified site of unspecified female breast: Secondary | ICD-10-CM

## 2011-04-12 DIAGNOSIS — C787 Secondary malignant neoplasm of liver and intrahepatic bile duct: Secondary | ICD-10-CM

## 2011-04-12 DIAGNOSIS — Z5112 Encounter for antineoplastic immunotherapy: Secondary | ICD-10-CM

## 2011-04-12 DIAGNOSIS — Z5111 Encounter for antineoplastic chemotherapy: Secondary | ICD-10-CM

## 2011-04-12 DIAGNOSIS — C7951 Secondary malignant neoplasm of bone: Secondary | ICD-10-CM

## 2011-04-12 LAB — CBC WITH DIFFERENTIAL/PLATELET
Basophils Absolute: 0 10*3/uL (ref 0.0–0.1)
EOS%: 2.9 % (ref 0.0–7.0)
MCH: 30.9 pg (ref 25.1–34.0)
MCHC: 34.8 g/dL (ref 31.5–36.0)
MCV: 88.7 fL (ref 79.5–101.0)
MONO%: 11.9 % (ref 0.0–14.0)
RBC: 4.79 10*6/uL (ref 3.70–5.45)
RDW: 12.5 % (ref 11.2–14.5)

## 2011-04-12 LAB — COMPREHENSIVE METABOLIC PANEL
ALT: 35 U/L (ref 0–35)
AST: 37 U/L (ref 0–37)
Albumin: 4.5 g/dL (ref 3.5–5.2)
Calcium: 8.9 mg/dL (ref 8.4–10.5)
Chloride: 106 mEq/L (ref 96–112)
Potassium: 3.6 mEq/L (ref 3.5–5.3)
Total Protein: 6.7 g/dL (ref 6.0–8.3)

## 2011-04-12 MED ORDER — SODIUM CHLORIDE 0.9 % IV SOLN
INTRAVENOUS | Status: AC
  Administered 2011-04-12: 14:00:00 via INTRAVENOUS
  Filled 2011-04-12: qty 1000

## 2011-04-12 MED ORDER — DEXAMETHASONE SODIUM PHOSPHATE 10 MG/ML IJ SOLN
4.0000 mg | Freq: Once | INTRAMUSCULAR | Status: AC
  Administered 2011-04-12: 4 mg via INTRAVENOUS

## 2011-04-12 MED ORDER — PROMETHAZINE HCL 6.25 MG/5ML PO SYRP: 12.5000 mg | ORAL_SOLUTION | Freq: Four times a day (QID) | ORAL | Status: DC | PRN

## 2011-04-12 MED ORDER — PROMETHAZINE HCL 25 MG/ML IJ SOLN
12.5000 mg | Freq: Once | INTRAMUSCULAR | Status: AC
  Administered 2011-04-12: 12.5 mg via INTRAVENOUS
  Filled 2011-04-12: qty 1

## 2011-04-12 MED ORDER — ONDANSETRON HCL 8 MG PO TABS: 8.0000 mg | ORAL_TABLET | Freq: Three times a day (TID) | ORAL | Status: AC | PRN

## 2011-04-12 MED ORDER — LORAZEPAM 0.5 MG PO TABS: 0.5000 mg | ORAL_TABLET | Freq: Three times a day (TID) | ORAL | Status: AC

## 2011-04-12 NOTE — Patient Instructions (Signed)
Patient discharged home with no complaints. 

## 2011-04-12 NOTE — Telephone Encounter (Signed)
New Rx for Xeloda and Tykerb sent to [facility] 03/29/11.

## 2011-04-16 NOTE — Progress Notes (Signed)
Hematology and Oncology Follow Up Visit  Kathy Jimenez [id number] [birth year] 41 y.o. 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 She has also been on a number of cardiac meds , including coreg , dose of which had been increasesd.  Medications: I have reviewed the patient's current medications.  Allergies:  Allergies  Allergen Reactions  . Sulfa Antibiotics Hives    Past Medical History, Surgical history, Social history, and Family History were reviewed and updated.  Review of Systems: Constitutional:  Negative for fever, chills, night sweats, anorexia, weight loss, pain. Cardiovascular: negative Respiratory: negative Neurological: negative Dermatological: negative ENT: negative Skin Gastrointestinal: negative Genito-Urinary: negative Hematological and Lymphatic: negative Breast: negative Musculoskeletal: negative Remaining ROS negative.  Physical Exam: Last menstrual period 09/28/2010. ECOG: 0 General appearance: alert, cooperative and appears stated age Head: Normocephalic, without obvious abnormality, atraumatic Neck: no adenopathy, no carotid bruit, no JVD, supple, symmetrical, trachea midline and thyroid not enlarged, symmetric, no tenderness/mass/nodules Lymph nodes: Cervical, supraclavicular, and axillary nodes normal. Cardiac : regular rate and rhythm, no murmurs or gallops Pulmonary:clear to auscultation bilaterally and normal percussion bilaterally Breasts: inspection negative, no nipple discharge or bleeding, no masses or nodularity palpable Abdomen:+tenderness ruq, on deep inspiration. Extremities negative Neuro: alert, oriented, normal speech, no focal  findings or movement disorder noted  Lab Results: Lab Results  Component Value Date   WBC 5.6 04/12/2011   HGB 14.8 04/12/2011   HCT 42.5 04/12/2011   MCV 88.7 04/12/2011   PLT 221 04/12/2011     Chemistry      Component Value Date/Time   NA 141 04/12/2011 0824   NA 141 09/02/2010 1134   K 3.6 04/12/2011 0824   K 4.3 09/02/2010 1134   CL 106 04/12/2011 0824   CL 103 09/02/2010 1134   CO2 22 04/12/2011 0824   CO2 25 09/02/2010 1134   BUN 17 04/12/2011 0824   BUN 11 09/02/2010 1134   CREATININE 1.22* 04/12/2011 0824   CREATININE 0.7 09/02/2010 1134      Component Value Date/Time   CALCIUM 8.9 04/12/2011 0824   CALCIUM 8.4 09/02/2010 1134   ALKPHOS 86 04/12/2011 0824   ALKPHOS 84 09/02/2010 1134   AST 37 04/12/2011 0824   AST 27 09/02/2010 1134   ALT 35 04/12/2011 0824   BILITOT 0.6 04/12/2011 0824   BILITOT 0.60 09/02/2010 1134      .pathology. Radiological Studies: chest X-ray n/a Mammogram n/a Bone density n/a  Impression and Plan: Kathy Jimenez is iontolerant of s/e from [place] /tykerb.Marland Kitchen She will be given fluids today with antiemetics and hopefully be able to restart at a lower dose in 3-5 days.  More than 50% of the visit was spent in patient-related counselling   Pierce Crane, MD 1/6/20136:07 PM

## 2011-04-21 ENCOUNTER — Ambulatory Visit (HOSPITAL_BASED_OUTPATIENT_CLINIC_OR_DEPARTMENT_OTHER): Payer: BC Managed Care – PPO

## 2011-04-21 ENCOUNTER — Other Ambulatory Visit: Payer: Self-pay | Admitting: Oncology

## 2011-04-21 ENCOUNTER — Telehealth: Payer: Self-pay | Admitting: Internal Medicine

## 2011-04-21 VITALS — BP 128/83 | HR 68 | Temp 98.4°F

## 2011-04-21 DIAGNOSIS — C7952 Secondary malignant neoplasm of bone marrow: Secondary | ICD-10-CM

## 2011-04-21 DIAGNOSIS — Z5112 Encounter for antineoplastic immunotherapy: Secondary | ICD-10-CM

## 2011-04-21 DIAGNOSIS — C50919 Malignant neoplasm of unspecified site of unspecified female breast: Secondary | ICD-10-CM

## 2011-04-21 DIAGNOSIS — C787 Secondary malignant neoplasm of liver and intrahepatic bile duct: Secondary | ICD-10-CM

## 2011-04-21 MED ORDER — GOSERELIN ACETATE 3.6 MG ~~LOC~~ IMPL
3.6000 mg | DRUG_IMPLANT | Freq: Once | SUBCUTANEOUS | Status: AC
Start: 2011-04-21 — End: 2011-04-21
  Administered 2011-04-21: 3.6 mg via SUBCUTANEOUS
  Filled 2011-04-21: qty 3.6

## 2011-04-21 MED ORDER — FULVESTRANT 250 MG/5ML IM SOLN
500.0000 mg | Freq: Once | INTRAMUSCULAR | Status: AC
  Administered 2011-04-21: 500 mg via INTRAMUSCULAR
  Filled 2011-04-21: qty 10

## 2011-04-21 MED ORDER — DENOSUMAB 120 MG/1.7ML ~~LOC~~ SOLN
120.0000 mg | Freq: Once | SUBCUTANEOUS | Status: AC
  Administered 2011-04-21: 120 mg via SUBCUTANEOUS
  Filled 2011-04-21: qty 1.7

## 2011-04-21 NOTE — Telephone Encounter (Signed)
Error

## 2011-04-25 ENCOUNTER — Other Ambulatory Visit: Payer: Self-pay | Admitting: Medical Oncology

## 2011-04-25 DIAGNOSIS — C50919 Malignant neoplasm of unspecified site of unspecified female breast: Secondary | ICD-10-CM

## 2011-04-25 MED ORDER — CAPECITABINE 500 MG PO TABS: 625.0000 mg/m2 | ORAL_TABLET | Freq: Two times a day (BID) | ORAL | Status: AC

## 2011-04-25 NOTE — Telephone Encounter (Signed)
Prescription for xeloda faxed to [facility]

## 2011-04-28 NOTE — Progress Notes (Signed)
Fax received from [facility] 04/25/11 stating that referral has been received, and they will be in touch when insurance is verified and delivery arrangements made.

## 2011-05-03 ENCOUNTER — Encounter (HOSPITAL_COMMUNITY): Payer: Self-pay

## 2011-05-03 ENCOUNTER — Ambulatory Visit (HOSPITAL_COMMUNITY)
Admission: RE | Admit: 2011-05-03 | Discharge: 2011-05-03 | Disposition: A | Discharge status: Home / Self Care | Payer: BC Managed Care – PPO | Source: Ambulatory Visit | Attending: Internal Medicine | Admitting: Internal Medicine

## 2011-05-03 VITALS — BP 96/54 | HR 90 | Wt 147.8 lb

## 2011-05-03 DIAGNOSIS — I5022 Chronic systolic (congestive) heart failure: Secondary | ICD-10-CM | POA: Insufficient documentation

## 2011-05-03 NOTE — Patient Instructions (Signed)
Your physician has requested that you have an echocardiogram. Echocardiography is a painless test that uses sound waves to create images of your heart. It provides your doctor with information about the size and shape of your heart and how well your heart's chambers and valves are working. This procedure takes approximately one hour. There are no restrictions for this procedure.  IN 3 WEEKS  Your physician recommends that you schedule a follow-up appointment in: 3 weeks

## 2011-05-03 NOTE — Progress Notes (Signed)
HPI:  Kathy Jimenez is a 41 y.o. female with L breast CA in 2004 s/p lumpectomy and treatment with FEC (flourouracil, epirubicin, cyclophosphamide). Also treated with hormonal therapy. 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  Started on weekly herceptin. Recently also started on Perjeta and Tykerb. Dr. Donnie Coffin evaluated her for a cough and progressive DOE; CT which showed dilated heart with pleural effusion and echo with EF 25% with global HK. Mod MR/TR, lateral s' 6.1 sec/m. Admitted to the hospital for volume overload and tachycardia.  Diuresed 9 lbs, discharge weight 151 lbs.  With history of severe kidney stones, lasix 20 mg given prn.    Preliminary Echo reviewed and EF mildly improved 25-30%, lateral s' 6.4 sec/m, LVIDd improved from 4.9 to 3.4.   She returns for follow up today.  She has started xeloda and decreased tykerb. Now on 3 weeks. Still with n/v. Feeling better. Still has some fatigue. Had to take lasix for a few days earlier in month.   She denies SOB/orthopnea/PND. Occasional dizziness.  No edema.  Has not needed lasix.  Took some time off from work but now back essentially full time.  Bedside echo probably about 40-45% if not better.   ROS: All systems negative except as listed in HPI, PMH and Problem List.  Past Medical History  Diagnosis Date  . 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 Kidney stones     recurrent since 1989  . Endometrial polyp   . PONV (postoperative nausea and vomiting)   . CHF (congestive heart failure)     from chemo treatment    Current Outpatient Prescriptions  Medication Sig Dispense Refill  . capecitabine (XELODA) 500 MG tablet Take 2 tablets (1,000 mg total) by mouth 2 (two) times daily after a meal. 14 days on and 7 days off  84 tablet  0  . carvedilol (COREG) 6.25 MG tablet Take 2 tablets (12.5 mg total) by mouth 2  (two) times daily with a meal.  60 tablet  6  . digoxin (LANOXIN) 0.25 MG tablet Take 1 tablet (0.25 mg total) by mouth daily.  30 tablet  6  . furosemide (LASIX) 20 MG tablet Take 1 tablet (20 mg total) by mouth daily as needed (If weight increases 3 pounds in 24 hours).  30 tablet  6  . lapatinib (TYKERB) 250 MG tablet Take 1,250 mg by mouth daily.        Marland Kitchen lisinopril (PRINIVIL,ZESTRIL) 10 MG tablet Take 1 tablet (10 mg total) by mouth 2 (two) times daily.  60 tablet  6  . promethazine (PHENERGAN) 6.25 MG/5ML syrup Take 10 mLs (12.5 mg total) by mouth 4 (four) times daily as needed.  120 mL  2  . SODIUM CITRATE PO Take by mouth 2 (two) times daily.        Marland Kitchen spironolactone (ALDACTONE) 25 MG tablet Take 1 tablet (25 mg total) by mouth daily.  30 tablet  6     PHYSICAL EXAM: Filed Vitals:   05/03/11 0818  BP: 88/58  Pulse: 90  Wt 149  General:  Well appearing. No resp difficulty HEENT: normal Neck: supple. JVP flat. Carotids 2+ bilaterally; no bruits. No lymphadenopathy or thryomegaly appreciated. Cor: PMI normal. Regular rate & rhythm. No S3.  Trivial TR murmur.   Lungs: clear Abdomen: soft, nontender, nondistended. No hepatosplenomegaly. No bruits  or masses. Good bowel sounds. Extremities: no cyanosis, clubbing, rash, edema Neuro: alert & orientedx3, cranial nerves grossly intact. Moves all 4 extremities w/o difficulty. Affect pleasant.    ASSESSMENT & PLAN:

## 2011-05-03 NOTE — Assessment & Plan Note (Signed)
She continues to improve from HF perspective though BP remain soft. I did bedside echo today in clinic and EF appears to be in 40-45% range but windows were suboptimal. Volume status looks very good. Will continue current regimen. I have asked her to cut back her work schedule to part time. Continue to hold Herceptin for now - however I suspect we may be able to restart at some point in the near future. She will return to clinic in 3 weeks with full echo.

## 2011-05-08 ENCOUNTER — Other Ambulatory Visit: Payer: Self-pay | Admitting: Gynecology

## 2011-05-10 ENCOUNTER — Other Ambulatory Visit: Payer: Self-pay | Admitting: Oncology

## 2011-05-10 ENCOUNTER — Other Ambulatory Visit (HOSPITAL_COMMUNITY): Payer: BC Managed Care – PPO

## 2011-05-10 DIAGNOSIS — C787 Secondary malignant neoplasm of liver and intrahepatic bile duct: Secondary | ICD-10-CM

## 2011-05-11 ENCOUNTER — Other Ambulatory Visit: Payer: Self-pay | Admitting: Oncology

## 2011-05-11 ENCOUNTER — Other Ambulatory Visit (HOSPITAL_BASED_OUTPATIENT_CLINIC_OR_DEPARTMENT_OTHER): Payer: BC Managed Care – PPO | Admitting: Lab

## 2011-05-11 DIAGNOSIS — C50919 Malignant neoplasm of unspecified site of unspecified female breast: Secondary | ICD-10-CM

## 2011-05-11 LAB — COMPREHENSIVE METABOLIC PANEL
Albumin: 3.9 g/dL (ref 3.5–5.2)
Alkaline Phosphatase: 95 U/L (ref 39–117)
CO2: 28 mEq/L (ref 19–32)
Glucose, Bld: 113 mg/dL — ABNORMAL HIGH (ref 70–99)
Potassium: 3.4 mEq/L — ABNORMAL LOW (ref 3.5–5.3)
Sodium: 140 mEq/L (ref 135–145)
Total Protein: 7.3 g/dL (ref 6.0–8.3)

## 2011-05-11 LAB — CBC WITH DIFFERENTIAL/PLATELET
Eosinophils Absolute: 0.2 10*3/uL (ref 0.0–0.5)
MONO#: 0.6 10*3/uL (ref 0.1–0.9)
NEUT#: 3.5 10*3/uL (ref 1.5–6.5)
RBC: 4.23 10*6/uL (ref 3.70–5.45)
RDW: 15.1 % — ABNORMAL HIGH (ref 11.2–14.5)
WBC: 5.9 10*3/uL (ref 3.9–10.3)

## 2011-05-11 LAB — LACTATE DEHYDROGENASE: LDH: 449 U/L — ABNORMAL HIGH (ref 94–250)

## 2011-05-11 LAB — CANCER ANTIGEN 27.29: CA 27.29: 226 U/mL — ABNORMAL HIGH (ref 0–39)

## 2011-05-12 ENCOUNTER — Other Ambulatory Visit: Payer: Self-pay | Admitting: Oncology

## 2011-05-15 ENCOUNTER — Telehealth: Payer: Self-pay | Admitting: *Deleted

## 2011-05-15 NOTE — Telephone Encounter (Signed)
Messages have been left for patient x three last week on home and cell numbers.  No return call from patient received.  Noted patient with intolerable nausea/vomiting and that these doses would be reduced.  [facility] has refill for xeloda as ordered with previous refills.  Will continue to try to reach patient for a week and then put xeloda on hold until further notice.  Will notify providers.  Called patient's home number, no answer, but message left asking for a return call to [facility] or [facility].

## 2011-05-15 NOTE — Telephone Encounter (Signed)
Prescription received from Dr. Donnie Coffin for patient's Xeloda.  She is to resume this on 05-24-11.  Current dose is xeloda 500mg , take three caplets by mouth qam and two caplets by mouth qpm, one week on, one week off.  Qty of seventy pills. No refills.  [facility] with this information.  [facility] received this as a verbal order and will call pt. Next week to set up delivery.  Called patient at work to alert her but she is in a meeting so message left on her voicemail requesting a return call.Marland Kitchen

## 2011-05-19 ENCOUNTER — Ambulatory Visit (HOSPITAL_BASED_OUTPATIENT_CLINIC_OR_DEPARTMENT_OTHER): Payer: BC Managed Care – PPO

## 2011-05-19 VITALS — BP 117/78 | HR 87 | Temp 98.0°F

## 2011-05-19 DIAGNOSIS — C50919 Malignant neoplasm of unspecified site of unspecified female breast: Secondary | ICD-10-CM

## 2011-05-19 DIAGNOSIS — Z5112 Encounter for antineoplastic immunotherapy: Secondary | ICD-10-CM

## 2011-05-19 DIAGNOSIS — C801 Malignant (primary) neoplasm, unspecified: Secondary | ICD-10-CM

## 2011-05-19 DIAGNOSIS — C787 Secondary malignant neoplasm of liver and intrahepatic bile duct: Secondary | ICD-10-CM

## 2011-05-19 MED ORDER — GOSERELIN ACETATE 3.6 MG ~~LOC~~ IMPL
3.6000 mg | DRUG_IMPLANT | Freq: Once | SUBCUTANEOUS | Status: AC
  Administered 2011-05-19: 3.6 mg via SUBCUTANEOUS
  Filled 2011-05-19: qty 3.6

## 2011-05-19 MED ORDER — DENOSUMAB 120 MG/1.7ML ~~LOC~~ SOLN
120.0000 mg | Freq: Once | SUBCUTANEOUS | Status: AC
  Administered 2011-05-19: 120 mg via SUBCUTANEOUS
  Filled 2011-05-19: qty 1.7

## 2011-05-19 MED ORDER — FULVESTRANT 250 MG/5ML IM SOLN
500.0000 mg | Freq: Once | INTRAMUSCULAR | Status: AC
  Administered 2011-05-19: 250 mg via INTRAMUSCULAR
  Filled 2011-05-19: qty 10

## 2011-05-24 ENCOUNTER — Ambulatory Visit (HOSPITAL_COMMUNITY)
Admission: RE | Admit: 2011-05-24 | Discharge: 2011-05-24 | Disposition: A | Discharge status: Home / Self Care | Payer: BC Managed Care – PPO | Source: Ambulatory Visit | Attending: Internal Medicine | Admitting: Internal Medicine

## 2011-05-24 VITALS — BP 122/62 | HR 53 | Wt 148.5 lb

## 2011-05-24 DIAGNOSIS — I5022 Chronic systolic (congestive) heart failure: Secondary | ICD-10-CM | POA: Insufficient documentation

## 2011-05-24 DIAGNOSIS — C50919 Malignant neoplasm of unspecified site of unspecified female breast: Secondary | ICD-10-CM | POA: Insufficient documentation

## 2011-05-24 DIAGNOSIS — C787 Secondary malignant neoplasm of liver and intrahepatic bile duct: Secondary | ICD-10-CM | POA: Insufficient documentation

## 2011-05-24 DIAGNOSIS — I509 Heart failure, unspecified: Secondary | ICD-10-CM | POA: Insufficient documentation

## 2011-05-24 DIAGNOSIS — Z01810 Encounter for preprocedural cardiovascular examination: Secondary | ICD-10-CM | POA: Insufficient documentation

## 2011-05-24 DIAGNOSIS — I369 Nonrheumatic tricuspid valve disorder, unspecified: Secondary | ICD-10-CM

## 2011-05-24 DIAGNOSIS — I5021 Acute systolic (congestive) heart failure: Secondary | ICD-10-CM

## 2011-05-24 NOTE — Progress Notes (Signed)
HPI:  Kathy Jimenez is a 41 y.o. female with L breast CA in 2004 s/p lumpectomy and treatment with FEC (flourouracil, epirubicin, cyclophosphamide). Also treated with hormonal therapy. 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  Started on weekly herceptin. Recently also started on Perjeta and Tykerb. Dr. Donnie Coffin evaluated her for a cough and progressive DOE; CT which showed dilated heart with pleural effusion and echo with EF 25% with global HK. Mod MR/TR, lateral s' 6.1 sec/m. Admitted to the hospital for volume overload and tachycardia.  Diuresed 9 lbs, discharge weight 151 lbs.  With history of severe kidney stones, lasix 20 mg given prn.    Preliminary Echo reviewed and EF mildly improved 25-30%, lateral s' 6.4 sec/m, LVIDd improved from 4.9 to 3.4.   She returns for follow up today.  Her tumor markers are back up to where they were prior to herceptin therapy.  She is to restart xeloda today.  She is feeling better.  She is anxious to restart therapy.  Working 5 days a week now.  No dizziness/CP/edema.  Echo today reviewed.  EF 50-55%, lateral s' 9 sec/m   ROS: All systems negative except as listed in HPI, PMH and Problem List.  Past Medical History  Diagnosis Date  . 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 Kidney stones     recurrent since 1989  . Endometrial polyp   . PONV (postoperative nausea and vomiting)   . CHF (congestive heart failure)     from chemo treatment    Current Outpatient Prescriptions  Medication Sig Dispense Refill  . carvedilol (COREG) 6.25 MG tablet Take 2 tablets (12.5 mg total) by mouth 2 (two) times daily with a meal.  60 tablet  6  . digoxin (LANOXIN) 0.25 MG tablet Take 1 tablet (0.25 mg total) by mouth daily.  30 tablet  6  . furosemide (LASIX) 20 MG tablet Take 1 tablet (20 mg total) by mouth daily as needed (If weight increases 3  pounds in 24 hours).  30 tablet  6  . lapatinib (TYKERB) 250 MG tablet Take 1,500 mg by mouth daily.       Marland Kitchen lisinopril (PRINIVIL,ZESTRIL) 10 MG tablet Take 1 tablet (10 mg total) by mouth 2 (two) times daily.  60 tablet  6  . promethazine (PHENERGAN) 6.25 MG/5ML syrup Take 10 mLs (12.5 mg total) by mouth 4 (four) times daily as needed.  120 mL  2  . SODIUM CITRATE PO Take by mouth 2 (two) times daily.        Marland Kitchen spironolactone (ALDACTONE) 25 MG tablet Take 1 tablet (25 mg total) by mouth daily.  30 tablet  6     PHYSICAL EXAM: Filed Vitals:   05/24/11 0957  BP: 122/62  Pulse: 53  Weight: 148 lb 8 oz (67.359 kg)  SpO2: 98%   General:  Well appearing. No resp difficulty HEENT: normal Neck: supple. JVP flat. Carotids 2+ bilaterally; no bruits. No lymphadenopathy or thryomegaly appreciated. Cor: PMI normal. Regular rate & rhythm. No S3.  Trivial TR murmur.   Lungs: clear Abdomen: soft, nontender, nondistended. No hepatosplenomegaly. No bruits or masses. Good bowel sounds. Extremities: no cyanosis, clubbing, rash, edema Neuro: alert & orientedx3, cranial nerves grossly intact. Moves all 4 extremities w/o difficulty. Affect pleasant.    ASSESSMENT & PLAN:

## 2011-05-24 NOTE — Progress Notes (Signed)
*  PRELIMINARY RESULTS* Echocardiogram 2D Echocardiogram has been performed.  Kathy Jimenez [facility] 05/24/2011, 10:48 AM

## 2011-05-24 NOTE — Patient Instructions (Signed)
Repeat echo in 3 weeks (first week of March)

## 2011-05-25 ENCOUNTER — Other Ambulatory Visit: Payer: Self-pay | Admitting: Oncology

## 2011-05-25 ENCOUNTER — Telehealth: Payer: Self-pay | Admitting: *Deleted

## 2011-05-25 MED FILL — Perflutren Lipid Microsphere IV Susp 6.52 MG/ML: INTRAVENOUS | Qty: 2 | Status: AC

## 2011-05-25 NOTE — Telephone Encounter (Signed)
Received fax confirmation of prescription shipment of xeloda on 05/23/11 for delivery next business day.

## 2011-05-26 ENCOUNTER — Ambulatory Visit: Payer: BC Managed Care – PPO

## 2011-05-26 ENCOUNTER — Ambulatory Visit (HOSPITAL_BASED_OUTPATIENT_CLINIC_OR_DEPARTMENT_OTHER): Payer: BC Managed Care – PPO

## 2011-05-26 ENCOUNTER — Other Ambulatory Visit: Payer: Self-pay | Admitting: Oncology

## 2011-05-26 VITALS — BP 112/75 | HR 69 | Temp 97.8°F

## 2011-05-26 DIAGNOSIS — C50919 Malignant neoplasm of unspecified site of unspecified female breast: Secondary | ICD-10-CM

## 2011-05-26 DIAGNOSIS — Z5112 Encounter for antineoplastic immunotherapy: Secondary | ICD-10-CM

## 2011-05-26 DIAGNOSIS — C787 Secondary malignant neoplasm of liver and intrahepatic bile duct: Secondary | ICD-10-CM

## 2011-05-26 LAB — COMPREHENSIVE METABOLIC PANEL
Alkaline Phosphatase: 96 U/L (ref 39–117)
BUN: 12 mg/dL (ref 6–23)
CO2: 27 mEq/L (ref 19–32)
Creatinine, Ser: 0.98 mg/dL (ref 0.50–1.10)
Glucose, Bld: 87 mg/dL (ref 70–99)
Total Bilirubin: 1 mg/dL (ref 0.3–1.2)
Total Protein: 7.2 g/dL (ref 6.0–8.3)

## 2011-05-26 LAB — CBC WITH DIFFERENTIAL/PLATELET
Basophils Absolute: 0 10*3/uL (ref 0.0–0.1)
Eosinophils Absolute: 0.2 10*3/uL (ref 0.0–0.5)
HCT: 36.6 % (ref 34.8–46.6)
LYMPH%: 26.8 % (ref 14.0–49.7)
MCV: 94 fL (ref 79.5–101.0)
MONO#: 0.6 10*3/uL (ref 0.1–0.9)
MONO%: 10.5 % (ref 0.0–14.0)
NEUT#: 3.3 10*3/uL (ref 1.5–6.5)
NEUT%: 58.8 % (ref 38.4–76.8)
Platelets: 212 10*3/uL (ref 145–400)
RBC: 3.9 10*6/uL (ref 3.70–5.45)
WBC: 5.6 10*3/uL (ref 3.9–10.3)

## 2011-05-26 LAB — LACTATE DEHYDROGENASE: LDH: 509 U/L — ABNORMAL HIGH (ref 94–250)

## 2011-05-26 LAB — CANCER ANTIGEN 27.29: CA 27.29: 288 U/mL — ABNORMAL HIGH (ref 0–39)

## 2011-05-26 MED ORDER — SODIUM CHLORIDE 0.9 % IJ SOLN
10.0000 mL | INTRAMUSCULAR | Status: DC | PRN
  Administered 2011-05-26: 10 mL
  Filled 2011-05-26: qty 10

## 2011-05-26 MED ORDER — TRASTUZUMAB CHEMO INJECTION 440 MG
2.0000 mg/kg | Freq: Once | INTRAVENOUS | Status: AC
  Administered 2011-05-26: 147 mg via INTRAVENOUS
  Filled 2011-05-26: qty 7

## 2011-05-26 MED ORDER — ACETAMINOPHEN 325 MG PO TABS
650.0000 mg | ORAL_TABLET | Freq: Once | ORAL | Status: AC
  Administered 2011-05-26: 650 mg via ORAL

## 2011-05-26 MED ORDER — SODIUM CHLORIDE 0.9 % IV SOLN
Freq: Once | INTRAVENOUS | Status: AC
  Administered 2011-05-26: 16:00:00 via INTRAVENOUS

## 2011-05-26 MED ORDER — HEPARIN SOD (PORK) LOCK FLUSH 100 UNIT/ML IV SOLN
500.0000 [IU] | Freq: Once | INTRAVENOUS | Status: AC | PRN
  Administered 2011-05-26: 500 [IU]
  Filled 2011-05-26: qty 5

## 2011-05-26 NOTE — Patient Instructions (Signed)
Call if any problems.  Pt aware of next appt. 

## 2011-05-29 NOTE — Assessment & Plan Note (Signed)
She is doing well, reviewed echo.  It appears EF has recovered with improved later s'.  She is on good cardiac medications and with tumor markers increasing will restart herceptin.  Discussed risk of cardiotoxicity, she voices understanding.  Dr.  Gala Romney discussed this with Dr. Donnie Coffin and they will start herceptin qweek.  Will repeat echo after 3 cycles.  Patient seen and examined with Ulyess Blossom PA-C. We discussed all aspects of the encounter. I agree with the assessment and plan as stated above. I reviewed echo personally with her in clinic. Her EF is now in the 50% range which is much improved. She is on good medical therapy. Given the fact that her tumor markers are increasing we are going to clear her to resume Herceptin with very close f/u. She is aware that there is a very real risk of recurrent LV dysfunction and symptomatic HF but given her situation I think the benefits of resuming therapy may outweigh the risks.

## 2011-06-02 ENCOUNTER — Other Ambulatory Visit: Payer: BC Managed Care – PPO | Admitting: Lab

## 2011-06-02 ENCOUNTER — Ambulatory Visit (HOSPITAL_BASED_OUTPATIENT_CLINIC_OR_DEPARTMENT_OTHER): Payer: BC Managed Care – PPO

## 2011-06-02 VITALS — BP 118/77 | HR 78 | Temp 98.2°F

## 2011-06-02 DIAGNOSIS — C787 Secondary malignant neoplasm of liver and intrahepatic bile duct: Secondary | ICD-10-CM

## 2011-06-02 DIAGNOSIS — Z5112 Encounter for antineoplastic immunotherapy: Secondary | ICD-10-CM

## 2011-06-02 DIAGNOSIS — C801 Malignant (primary) neoplasm, unspecified: Secondary | ICD-10-CM

## 2011-06-02 DIAGNOSIS — C50919 Malignant neoplasm of unspecified site of unspecified female breast: Secondary | ICD-10-CM

## 2011-06-02 MED ORDER — SODIUM CHLORIDE 0.9 % IV SOLN
Freq: Once | INTRAVENOUS | Status: AC
  Administered 2011-06-02: 09:00:00 via INTRAVENOUS

## 2011-06-02 MED ORDER — SODIUM CHLORIDE 0.9 % IJ SOLN
10.0000 mL | INTRAMUSCULAR | Status: DC | PRN
  Administered 2011-06-02: 10 mL
  Filled 2011-06-02: qty 10

## 2011-06-02 MED ORDER — DIPHENHYDRAMINE HCL 25 MG PO CAPS: 50.0000 mg | ORAL_CAPSULE | Freq: Once | ORAL | Status: DC

## 2011-06-02 MED ORDER — HEPARIN SOD (PORK) LOCK FLUSH 100 UNIT/ML IV SOLN
500.0000 [IU] | Freq: Once | INTRAVENOUS | Status: AC | PRN
  Administered 2011-06-02: 500 [IU]
  Filled 2011-06-02: qty 5

## 2011-06-02 MED ORDER — ACETAMINOPHEN 325 MG PO TABS
650.0000 mg | ORAL_TABLET | Freq: Once | ORAL | Status: AC
  Administered 2011-06-02: 650 mg via ORAL

## 2011-06-02 MED ORDER — TRASTUZUMAB CHEMO INJECTION 440 MG
2.0000 mg/kg | Freq: Once | INTRAVENOUS | Status: AC
  Administered 2011-06-02: 147 mg via INTRAVENOUS
  Filled 2011-06-02: qty 7

## 2011-06-09 ENCOUNTER — Other Ambulatory Visit: Payer: BC Managed Care – PPO | Admitting: Lab

## 2011-06-09 ENCOUNTER — Ambulatory Visit (HOSPITAL_BASED_OUTPATIENT_CLINIC_OR_DEPARTMENT_OTHER): Payer: BC Managed Care – PPO

## 2011-06-09 ENCOUNTER — Other Ambulatory Visit: Payer: Self-pay | Admitting: *Deleted

## 2011-06-09 VITALS — BP 109/71 | HR 73 | Temp 98.5°F

## 2011-06-09 DIAGNOSIS — E876 Hypokalemia: Secondary | ICD-10-CM

## 2011-06-09 DIAGNOSIS — C787 Secondary malignant neoplasm of liver and intrahepatic bile duct: Secondary | ICD-10-CM

## 2011-06-09 DIAGNOSIS — Z5112 Encounter for antineoplastic immunotherapy: Secondary | ICD-10-CM

## 2011-06-09 DIAGNOSIS — C50919 Malignant neoplasm of unspecified site of unspecified female breast: Secondary | ICD-10-CM

## 2011-06-09 DIAGNOSIS — I5021 Acute systolic (congestive) heart failure: Secondary | ICD-10-CM

## 2011-06-09 LAB — CBC WITH DIFFERENTIAL/PLATELET
BASO%: 0.5 % (ref 0.0–2.0)
Basophils Absolute: 0 10*3/uL (ref 0.0–0.1)
EOS%: 3.2 % (ref 0.0–7.0)
HCT: 35.2 % (ref 34.8–46.6)
HGB: 12.7 g/dL (ref 11.6–15.9)
MCH: 32.1 pg (ref 25.1–34.0)
MONO#: 0.6 10*3/uL (ref 0.1–0.9)
NEUT#: 3.8 10*3/uL (ref 1.5–6.5)
RDW: 15.5 % — ABNORMAL HIGH (ref 11.2–14.5)
WBC: 6.3 10*3/uL (ref 3.9–10.3)
lymph#: 1.7 10*3/uL (ref 0.9–3.3)

## 2011-06-09 LAB — COMPREHENSIVE METABOLIC PANEL
ALT: 27 U/L (ref 0–35)
AST: 27 U/L (ref 0–37)
Albumin: 3.6 g/dL (ref 3.5–5.2)
BUN: 13 mg/dL (ref 6–23)
CO2: 27 mEq/L (ref 19–32)
Calcium: 10 mg/dL (ref 8.4–10.5)
Chloride: 105 mEq/L (ref 96–112)
Potassium: 3.6 mEq/L (ref 3.5–5.3)

## 2011-06-09 LAB — CANCER ANTIGEN 27.29: CA 27.29: 202 U/mL — ABNORMAL HIGH (ref 0–39)

## 2011-06-09 MED ORDER — TRASTUZUMAB CHEMO INJECTION 440 MG
2.0000 mg/kg | Freq: Once | INTRAVENOUS | Status: AC
  Administered 2011-06-09: 147 mg via INTRAVENOUS
  Filled 2011-06-09: qty 7

## 2011-06-09 NOTE — Progress Notes (Signed)
Herceptin to run over 30 minutes per patient.

## 2011-06-11 ENCOUNTER — Encounter: Payer: Self-pay | Admitting: Oncology

## 2011-06-14 ENCOUNTER — Ambulatory Visit (HOSPITAL_COMMUNITY): Payer: BC Managed Care – PPO | Attending: Internal Medicine

## 2011-06-15 ENCOUNTER — Encounter (HOSPITAL_COMMUNITY): Payer: Self-pay | Admitting: General Practice

## 2011-06-15 ENCOUNTER — Ambulatory Visit (HOSPITAL_BASED_OUTPATIENT_CLINIC_OR_DEPARTMENT_OTHER)
Admission: RE | Admit: 2011-06-15 | Discharge: 2011-06-15 | Disposition: A | Discharge status: Home / Self Care | Payer: BC Managed Care – PPO | Source: Ambulatory Visit | Attending: Internal Medicine | Admitting: Internal Medicine

## 2011-06-15 ENCOUNTER — Inpatient Hospital Stay (HOSPITAL_COMMUNITY): Payer: BC Managed Care – PPO

## 2011-06-15 ENCOUNTER — Other Ambulatory Visit (HOSPITAL_COMMUNITY): Payer: Self-pay | Admitting: Adult Health

## 2011-06-15 ENCOUNTER — Ambulatory Visit (HOSPITAL_COMMUNITY)
Admission: RE | Admit: 2011-06-15 | Discharge: 2011-06-15 | Disposition: A | Discharge status: Home / Self Care | Payer: BC Managed Care – PPO | Source: Ambulatory Visit | Attending: Adult Health | Admitting: Adult Health

## 2011-06-15 ENCOUNTER — Encounter (HOSPITAL_COMMUNITY): Payer: Self-pay

## 2011-06-15 ENCOUNTER — Inpatient Hospital Stay (HOSPITAL_COMMUNITY)
Admission: AD | Admit: 2011-06-15 | Discharge: 2011-06-16 | DRG: 551 | Disposition: A | Discharge status: Home / Self Care | Payer: BC Managed Care – PPO | Source: Ambulatory Visit | Attending: Internal Medicine | Admitting: Internal Medicine

## 2011-06-15 DIAGNOSIS — C7900 Secondary malignant neoplasm of unspecified kidney and renal pelvis: Secondary | ICD-10-CM | POA: Diagnosis present

## 2011-06-15 DIAGNOSIS — C50919 Malignant neoplasm of unspecified site of unspecified female breast: Secondary | ICD-10-CM

## 2011-06-15 DIAGNOSIS — Z9889 Other specified postprocedural states: Secondary | ICD-10-CM

## 2011-06-15 DIAGNOSIS — I5022 Chronic systolic (congestive) heart failure: Secondary | ICD-10-CM

## 2011-06-15 DIAGNOSIS — E876 Hypokalemia: Secondary | ICD-10-CM

## 2011-06-15 DIAGNOSIS — C7952 Secondary malignant neoplasm of bone marrow: Secondary | ICD-10-CM | POA: Diagnosis present

## 2011-06-15 DIAGNOSIS — Z853 Personal history of malignant neoplasm of breast: Secondary | ICD-10-CM

## 2011-06-15 DIAGNOSIS — C787 Secondary malignant neoplasm of liver and intrahepatic bile duct: Secondary | ICD-10-CM | POA: Diagnosis present

## 2011-06-15 DIAGNOSIS — R109 Unspecified abdominal pain: Secondary | ICD-10-CM

## 2011-06-15 DIAGNOSIS — R112 Nausea with vomiting, unspecified: Principal | ICD-10-CM

## 2011-06-15 DIAGNOSIS — C7951 Secondary malignant neoplasm of bone: Secondary | ICD-10-CM | POA: Diagnosis present

## 2011-06-15 DIAGNOSIS — T451X5A Adverse effect of antineoplastic and immunosuppressive drugs, initial encounter: Secondary | ICD-10-CM | POA: Diagnosis present

## 2011-06-15 LAB — COMPREHENSIVE METABOLIC PANEL
Albumin: 3.8 g/dL (ref 3.5–5.2)
BUN: 17 mg/dL (ref 6–23)
Creatinine, Ser: 0.98 mg/dL (ref 0.50–1.10)
Total Protein: 7.2 g/dL (ref 6.0–8.3)

## 2011-06-15 LAB — CBC
HCT: 35.9 % — ABNORMAL LOW (ref 36.0–46.0)
Hemoglobin: 12.9 g/dL (ref 12.0–15.0)
MCV: 89.8 fL (ref 78.0–100.0)
RBC: 4 MIL/uL (ref 3.87–5.11)
WBC: 7.1 10*3/uL (ref 4.0–10.5)

## 2011-06-15 MED ORDER — CARVEDILOL 6.25 MG PO TABS
6.2500 mg | ORAL_TABLET | Freq: Two times a day (BID) | ORAL | Status: DC
  Administered 2011-06-16: 6.25 mg via ORAL
  Filled 2011-06-15 (×4): qty 1

## 2011-06-15 MED ORDER — IOHEXOL 300 MG/ML  SOLN
80.0000 mL | Freq: Once | INTRAMUSCULAR | Status: AC | PRN
  Administered 2011-06-15: 80 mL via INTRAVENOUS

## 2011-06-15 MED ORDER — ACETAMINOPHEN 325 MG PO TABS: 650.0000 mg | ORAL_TABLET | Freq: Four times a day (QID) | ORAL | Status: DC | PRN

## 2011-06-15 MED ORDER — ONDANSETRON 8 MG/NS 50 ML IVPB
8.0000 mg | Freq: Four times a day (QID) | INTRAVENOUS | Status: DC | PRN
  Filled 2011-06-15: qty 8

## 2011-06-15 MED ORDER — DEXTROSE-NACL 5-0.45 % IV SOLN
INTRAVENOUS | Status: AC
  Administered 2011-06-15: 18:00:00 via INTRAVENOUS

## 2011-06-15 MED ORDER — ACETAMINOPHEN 650 MG RE SUPP: 650.0000 mg | Freq: Four times a day (QID) | RECTAL | Status: DC | PRN

## 2011-06-15 MED ORDER — IOHEXOL 300 MG/ML  SOLN: 20.0000 mL | INTRAMUSCULAR | Status: AC

## 2011-06-15 MED ORDER — SODIUM CHLORIDE 0.9 % IV BOLUS (SEPSIS)
1000.0000 mL | Freq: Once | INTRAVENOUS | Status: AC
  Administered 2011-06-15: 1000 mL via INTRAVENOUS

## 2011-06-15 MED ORDER — METOCLOPRAMIDE HCL 10 MG/10ML PO SOLN
10.0000 mg | Freq: Three times a day (TID) | ORAL | Status: DC
  Administered 2011-06-15 – 2011-06-16 (×3): 10 mg via ORAL
  Filled 2011-06-15 (×7): qty 10

## 2011-06-15 MED ORDER — PROMETHAZINE HCL 12.5 MG PO TABS
12.5000 mg | ORAL_TABLET | Freq: Four times a day (QID) | ORAL | Status: DC | PRN
  Administered 2011-06-15: 12.5 mg via ORAL
  Filled 2011-06-15: qty 1

## 2011-06-15 MED ORDER — LISINOPRIL 10 MG PO TABS
10.0000 mg | ORAL_TABLET | Freq: Every day | ORAL | Status: DC
  Administered 2011-06-16: 10 mg via ORAL
  Filled 2011-06-15 (×2): qty 1

## 2011-06-15 MED ORDER — SODIUM CHLORIDE 0.9 % IJ SOLN
3.0000 mL | Freq: Two times a day (BID) | INTRAMUSCULAR | Status: DC
  Administered 2011-06-15: 10 mL via INTRAVENOUS

## 2011-06-15 MED ORDER — ENOXAPARIN SODIUM 40 MG/0.4ML ~~LOC~~ SOLN
40.0000 mg | SUBCUTANEOUS | Status: DC
  Administered 2011-06-15: 40 mg via SUBCUTANEOUS
  Filled 2011-06-15 (×2): qty 0.4

## 2011-06-15 MED ORDER — PROMETHAZINE HCL 25 MG/ML IJ SOLN
25.0000 mg | INTRAMUSCULAR | Status: DC | PRN
  Administered 2011-06-15: 25 mg via INTRAVENOUS
  Filled 2011-06-15 (×2): qty 1

## 2011-06-15 NOTE — Progress Notes (Signed)
Pt arrived to room via w/c with family at bedside. Pt alert, oriented, no acute distress, vitals obtained, placed on monitor, pt weighed, oriented to room, equipment and plan of care. IV team notified for port access. Orders released.

## 2011-06-15 NOTE — Progress Notes (Signed)
Patient vomiting after IV administration of phenergan in order to drink oral CT contrast.  MD made aware and wants to proceed with oral contrast and CT scan.  Will continue to monitor. Nolon Nations

## 2011-06-15 NOTE — Assessment & Plan Note (Addendum)
Persistent nausea/vomiting for 5-6 days. Obtain KUB. Will admit to [facility] for additional test.    Patient seen and examined with Tonye Becket, NP. We discussed all aspects of the encounter. I agree with the assessment and plan as stated above. She has severe nausea and vomiting. Unable to keep anything down. On exam belly is soft but mildly tender diffusely. Very hypoactive BS. I am unsure what is going on. We have accessed her port here in clinic for IVFs. Will admit for IVF and anti-emetics. Will check KUB and probable AB CT. I have discussed with Dr. Donnie Coffin who will see her as well.

## 2011-06-15 NOTE — Assessment & Plan Note (Addendum)
Appears dehydrated. She was given 1 liter NS bolus with no improvement. Continues to complain of persistent nausea and vomiting. Will admit for additional test.   Attending: Does not appear to have HF on exam but could be low output. Will check BNP and repeat. Echo.

## 2011-06-15 NOTE — H&P (Signed)
HPI: Kathy Jimenez is a 41 y.o. female with L breast CA in 2004 s/p lumpectomy and treatment with FEC (flourouracil, epirubicin, cyclophosphamide). Also treated with hormonal therapy. 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 Started on weekly herceptin. Recently also started on Perjeta and Tykerb. Dr. Donnie Coffin evaluated her for a cough and progressive DOE; CT which showed dilated heart with pleural effusion and echo with EF 25% with global HK. Mod MR/TR, lateral s' 6.1 sec/m. Admitted to the hospital for volume overload and tachycardia. Diuresed 9 lbs, discharge weight 151 lbs. With history of severe kidney stones, lasix 20 mg given prn.   Followed closely in HF clinic due to cardiotoxicity associated with Herceptin. She was placed on digoxin, Ace Inhibitor, Beta Blocker. Hercpetin was placed on hold for one cycle and later resumed after repeat ECHO revealed improved Lateral S'.   She returned to Heart Failure Clinic today with complaints of persistent nausea and vomiting for the last 5 days. She discussed ongoing nausea with Dr Donnie Coffin and Tykerb was stopped on Saturday. Developed abdominal pain in the mid-epigastic pain over the last 12 hours. Vomiting multiple times throughout the day that does not improve with Zofran. Increased fatigue.  She has had 2-3 bowel movements daily. Received 1 liter NS in HF clinic with no improvement.   Review of Systems:     Cardiac Review of Systems: {Y] = yes [ ]  = no  Chest Pain [    ]  Resting SOB [   ] Exertional SOB  [  ]  Orthopnea [  ]   Pedal Edema [   ]    Palpitations [  ] Syncope  [  ]   Presyncope [   ]  General Review of Systems: [Y] = yes [  ]=no Constitional: recent weight change [ Y ]; anorexia [  ]; fatigue [Y]; nausea [Y  ]; night sweats [  ]; fever [  ]; or chills [  ];                                                                                                                                           Dental: poor dentition[  ]; Last Dentist visit:  Eye : blurred vision [  ]; diplopia [   ]; vision changes [  ];  Amaurosis fugax[  ]; Resp: cough [  ];  wheezing[  ];  hemoptysis[  ]; shortness of breath[  ]; paroxysmal nocturnal dyspnea[  ]; dyspnea on exertion[  ]; or orthopnea[  ];  GI:  gallstones[  ], vomiting[  ];  dysphagia[  ]; melena[  ];  hematochezia [  ]; heartburn[  ];   Hx of  Colonoscopy[  ]; GU: kidney stones [  ]; hematuria[  ];  dysuria [  ];  nocturia[  ];  history of  obstruction [  ];                 Skin: rash, swelling[  ];, hair loss[  ];  peripheral edema[  ];  or itching[  ]; Musculosketetal: myalgias[  ];  joint swelling[  ];  joint erythema[  ];  joint pain[  ];  back pain[  ];  Heme/Lymph: bruising[  ];  bleeding[  ];  anemia[  ];  Neuro: TIA[  ];  headaches[  ];  stroke[  ];  vertigo[  ];  seizures[  ];   paresthesias[  ];  difficulty walking[  ];  Psych:depression[  ]; anxiety[  ];  Endocrine: diabetes[  ];  thyroid dysfunction[  ];  Immunizations: Flu [  ]; Pneumococcal[  ];  Other:  Past Medical History  Diagnosis Date  . 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 Kidney stones     recurrent since 1989  . Endometrial polyp   . PONV (postoperative nausea and vomiting)   . CHF (congestive heart failure)     from chemo treatment    Medications Prior to Admission  Medication Sig Dispense Refill  . carvedilol (COREG) 6.25 MG tablet Take 2 tablets (12.5 mg total) by mouth 2 (two) times daily with a meal.  60 tablet  6  . digoxin (LANOXIN) 0.25 MG tablet Take 1 tablet (0.25 mg total) by mouth daily.  30 tablet  6  . furosemide (LASIX) 20 MG tablet Take 1 tablet (20 mg total) by mouth daily as needed (If weight increases 3 pounds in 24 hours).  30 tablet  6  . lapatinib (TYKERB) 250 MG tablet Take 1,500 mg by mouth daily.       Marland Kitchen lisinopril (PRINIVIL,ZESTRIL) 10 MG tablet Take 1  tablet (10 mg total) by mouth 2 (two) times daily.  60 tablet  6  . promethazine (PHENERGAN) 6.25 MG/5ML syrup Take 10 mLs (12.5 mg total) by mouth 4 (four) times daily as needed.  120 mL  2  . SODIUM CITRATE PO Take by mouth 2 (two) times daily.        Marland Kitchen spironolactone (ALDACTONE) 25 MG tablet Take 1 tablet (25 mg total) by mouth daily.  30 tablet  6   Medications Prior to Admission  Medication Dose Route Frequency Provider Last Rate Last Dose  . sodium chloride 0.9 % bolus 1,000 mL  1,000 mL Intravenous Once Tonye Becket, NP         Allergies  Allergen Reactions  . Sulfa Antibiotics Hives    History   Social History  . Marital Status: Divorced    Spouse Name: N/A    Number of Children: N/A  . Years of Education: N/A   Occupational History  . Director of [employer]   Social History Main Topics  . Smoking status: Never Smoker   . Smokeless tobacco: Not on file  . Alcohol Use: 0.6 oz/week    1 Glasses of wine per week  . Drug Use: No  . Sexually Active: No   Other Topics Concern  . Not on file   Social History Narrative  . No narrative on file    No family history on file.  PHYSICAL EXAM: Filed Vitals:   06/15/11 0917  Pulse: 88  Doppler BP 82 General: Ill  appearing. No respiratory difficulty HEENT: normal Neck: supple. no  JVD. Carotids 2+ bilat; no bruits. No lymphadenopathy or thryomegaly appreciated. Cor: PMI nondisplaced. Regular rate & rhythm. No rubs, gallops or murmurs. Lungs: clear Abdomen: soft, nontender, nondistended. No hepatosplenomegaly. No bruits or masses. Bowel sounds hypoactive. Extremities: no cyanosis, clubbing, rash, edema Neuro: alert & oriented x 3, cranial nerves grossly intact. moves all 4 extremities w/o difficulty. Affect pleasant.    Results for orders placed during the hospital encounter of 06/15/11 (from the past 24 hour(s))  COMPREHENSIVE METABOLIC PANEL     Status: Abnormal   Collection Time   06/15/11 10:10 AM       Component Value Range   Sodium 138  135 - 145 (mEq/L)   Potassium 3.5  3.5 - 5.1 (mEq/L)   Chloride 104  96 - 112 (mEq/L)   CO2 24  19 - 32 (mEq/L)   Glucose, Bld 104 (*) 70 - 99 (mg/dL)   BUN 17  6 - 23 (mg/dL)   Creatinine, Ser 7.82  0.50 - 1.10 (mg/dL)   Calcium 9.6  8.4 - 95.6 (mg/dL)   Total Protein 7.2  6.0 - 8.3 (g/dL)   Albumin 3.8  3.5 - 5.2 (g/dL)   AST 29  0 - 37 (U/L)   ALT 26  0 - 35 (U/L)   Alkaline Phosphatase 100  39 - 117 (U/L)   Total Bilirubin 1.0  0.3 - 1.2 (mg/dL)   GFR calc non Af Amer 70 (*) >90 (mL/min)   GFR calc Af Amer 81 (*) >90 (mL/min)  CBC     Status: Abnormal   Collection Time   06/15/11 10:10 AM      Component Value Range   WBC 7.1  4.0 - 10.5 (K/uL)   RBC 4.00  3.87 - 5.11 (MIL/uL)   Hemoglobin 12.9  12.0 - 15.0 (g/dL)   HCT 21.3 (*) 08.6 - 46.0 (%)   MCV 89.8  78.0 - 100.0 (fL)   MCH 32.3  26.0 - 34.0 (pg)   MCHC 35.9  30.0 - 36.0 (g/dL)   RDW 57.8  46.9 - 62.9 (%)   Platelets 195  150 - 400 (K/uL)   Dg Abd Portable 1v  06/15/2011  *RADIOLOGY REPORT*  Clinical Data: 41 year old female with abdominal pain nausea and vomiting.  History of breast cancer.  PORTABLE ABDOMEN - 1 VIEW  Technique: Portable supine AP view of the abdomen 1109 hours.  Comparison:   03/24/2011.  Findings: Nonobstructed bowel gas pattern.Abdominal and pelvic visceral contours are within normal limits. Stable visualized osseous structures.    IMPRESSION:  Nonobstructed bowel gas pattern.  Original Report Authenticated By: Harley Hallmark, M.D.     ASSESSMENT: 1. Abdominal Pain 2. Persistent n/v 3, Chronic Systolic Heart Failure - previous EF 20-25%. Most recently 50% 4. Metastatic breast cancer (metastasized to liver)   PLAN/DISCUSSION:  Will admit to telemetry for additional test. Dr Donnie Coffin to follow. KUB negative. Will obtain CT of abdomen with and without contrast. Check labs. Continue IV fluids for an additional liter.   Patient seen and examined with Tonye Becket,  NP. We discussed all aspects of the encounter. I agree with the assessment and plan as stated above. She has severe nausea and vomiting. Unable to keep anything down. On exam belly is soft but mildly tender diffusely. Very hypoactive BS. I am unsure what is going on. We have accessed her port here in clinic for IVFs. Will admit for IVF and anti-emetics. Will check KUB and probable AB CT. I have discussed  with Dr. Donnie Coffin who will see her as well. Check echo and BNP to exclude recurrent low-output HF as contributor.   Bernis Stecher,MD 12:51 PM

## 2011-06-15 NOTE — Assessment & Plan Note (Signed)
Volume status low. BP via doppler 82. Weight down 6 pounds down due to persistent nausea/vomiting. Give 1 liter NS bolus now. Obtain CMET and CBC.

## 2011-06-15 NOTE — Progress Notes (Signed)
Patient ID: Kathy Jimenez, female   DOB: 1970-06-05, 41 y.o.   MRN: [id number] HPI:  Kathy Jimenez is a 41 y.o. female with L breast CA in 2004 s/p lumpectomy and treatment with FEC (flourouracil, epirubicin, cyclophosphamide). Also treated with hormonal therapy. 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  Started on weekly herceptin. Recently also started on Perjeta and Tykerb. Dr. Donnie Coffin evaluated her for a cough and progressive DOE; CT which showed dilated heart with pleural effusion and echo with EF 25% with global HK. Mod MR/TR, lateral s' 6.1 sec/m. Admitted to the hospital for volume overload and tachycardia.  Diuresed 9 lbs, discharge weight 151 lbs.  With history of severe kidney stones, lasix 20 mg given prn.    Preliminary Echo reviewed and EF mildly improved 25-30%, lateral s' 6.4 sec/m, LVIDd improved from 4.9 to 3.4.   She returned to Heart Failure Clinic today with complaints of persistent nausea and vomiting for the last 5 days. She discussed ongoing nausea with Dr Donnie Coffin and Tykerb was stopped on Saturday. Developed abdominal pain in the mid-epigastic pain over the last 12 hours. Vomiting multiple times throughout the day that does not improve with Zofran. Increased fatigue.  She has had 2-3 bowel movements daily. Received 1 liter NS in HF clinic with no improvement.    ROS: All systems negative except as listed in HPI, PMH and Problem List.  Past Medical History  Diagnosis Date  . 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 Kidney stones     recurrent since 1989  . Endometrial polyp   . PONV (postoperative nausea and vomiting)   . CHF (congestive heart failure)     from chemo treatment    Current Outpatient Prescriptions  Medication Sig Dispense Refill  . carvedilol (COREG) 6.25 MG tablet Take 2 tablets (12.5 mg total) by mouth 2 (two) times daily with a  meal.  60 tablet  6  . digoxin (LANOXIN) 0.25 MG tablet Take 1 tablet (0.25 mg total) by mouth daily.  30 tablet  6  . furosemide (LASIX) 20 MG tablet Take 1 tablet (20 mg total) by mouth daily as needed (If weight increases 3 pounds in 24 hours).  30 tablet  6  . lapatinib (TYKERB) 250 MG tablet Take 1,500 mg by mouth daily.       Marland Kitchen lisinopril (PRINIVIL,ZESTRIL) 10 MG tablet Take 1 tablet (10 mg total) by mouth 2 (two) times daily.  60 tablet  6  . promethazine (PHENERGAN) 6.25 MG/5ML syrup Take 10 mLs (12.5 mg total) by mouth 4 (four) times daily as needed.  120 mL  2  . SODIUM CITRATE PO Take by mouth 2 (two) times daily.        Marland Kitchen spironolactone (ALDACTONE) 25 MG tablet Take 1 tablet (25 mg total) by mouth daily.  30 tablet  6     PHYSICAL EXAM: Filed Vitals:   06/15/11 0917  Pulse: 88  Weight: 142 lb (64.411 kg)  SpO2: 99%   General:  Ill appearing. wretching No resp difficulty HEENT: normal Neck: supple. JVP flat. Carotids 2+ bilaterally; no bruits. No lymphadenopathy or thryomegaly appreciated. Cor: PMI normal. Regular rate & rhythm. No S3.  Trivial TR murmur.   Lungs: clear Abdomen: soft. Mildly tender diffusely, nondistended. No hepatosplenomegaly. No bruits or masses. Very hypoactive bowel sounds. Extremities: no cyanosis, clubbing,  rash, edema Neuro: alert & orientedx3, cranial nerves grossly intact. Moves all 4 extremities w/o difficulty. Affect pleasant.    ASSESSMENT & PLAN:

## 2011-06-15 NOTE — Progress Notes (Signed)
Encounter addended by: Noralee Space, RN on: 06/15/2011  1:13 PM<BR>     Documentation filed: Inpatient MAR

## 2011-06-16 ENCOUNTER — Ambulatory Visit (HOSPITAL_BASED_OUTPATIENT_CLINIC_OR_DEPARTMENT_OTHER): Payer: BC Managed Care – PPO

## 2011-06-16 ENCOUNTER — Ambulatory Visit: Payer: BC Managed Care – PPO

## 2011-06-16 ENCOUNTER — Other Ambulatory Visit: Payer: Self-pay | Admitting: *Deleted

## 2011-06-16 ENCOUNTER — Other Ambulatory Visit: Payer: Self-pay | Admitting: Oncology

## 2011-06-16 ENCOUNTER — Other Ambulatory Visit: Payer: BC Managed Care – PPO

## 2011-06-16 VITALS — BP 110/63 | HR 75 | Temp 98.3°F

## 2011-06-16 DIAGNOSIS — Z5111 Encounter for antineoplastic chemotherapy: Secondary | ICD-10-CM

## 2011-06-16 DIAGNOSIS — C50919 Malignant neoplasm of unspecified site of unspecified female breast: Secondary | ICD-10-CM

## 2011-06-16 DIAGNOSIS — R112 Nausea with vomiting, unspecified: Secondary | ICD-10-CM

## 2011-06-16 DIAGNOSIS — R109 Unspecified abdominal pain: Secondary | ICD-10-CM

## 2011-06-16 DIAGNOSIS — Z09 Encounter for follow-up examination after completed treatment for conditions other than malignant neoplasm: Secondary | ICD-10-CM

## 2011-06-16 DIAGNOSIS — C787 Secondary malignant neoplasm of liver and intrahepatic bile duct: Secondary | ICD-10-CM

## 2011-06-16 DIAGNOSIS — Z853 Personal history of malignant neoplasm of breast: Secondary | ICD-10-CM

## 2011-06-16 DIAGNOSIS — Z5112 Encounter for antineoplastic immunotherapy: Secondary | ICD-10-CM

## 2011-06-16 DIAGNOSIS — E876 Hypokalemia: Secondary | ICD-10-CM

## 2011-06-16 LAB — CBC
Hemoglobin: 11.2 g/dL — ABNORMAL LOW (ref 12.0–15.0)
MCHC: 36.5 g/dL — ABNORMAL HIGH (ref 30.0–36.0)
Platelets: 159 10*3/uL (ref 150–400)
RDW: 15.1 % (ref 11.5–15.5)

## 2011-06-16 LAB — BASIC METABOLIC PANEL
CO2: 22 mEq/L (ref 19–32)
Calcium: 7.9 mg/dL — ABNORMAL LOW (ref 8.4–10.5)
GFR calc Af Amer: 87 mL/min — ABNORMAL LOW (ref >90)
GFR calc non Af Amer: 75 mL/min — ABNORMAL LOW (ref >90)
GFR calc non Af Amer: 85 mL/min — ABNORMAL LOW (ref >90)
Potassium: 3 mEq/L — ABNORMAL LOW (ref 3.5–5.1)
Sodium: 136 mEq/L (ref 135–145)
Sodium: 137 mEq/L (ref 135–145)

## 2011-06-16 LAB — LIPASE, BLOOD: Lipase: 36 U/L (ref 11–59)

## 2011-06-16 LAB — AMYLASE: Amylase: 77 U/L (ref 0–105)

## 2011-06-16 MED ORDER — TRASTUZUMAB CHEMO INJECTION 440 MG
2.0000 mg/kg | Freq: Once | INTRAVENOUS | Status: AC
  Administered 2011-06-16: 126 mg via INTRAVENOUS
  Filled 2011-06-16: qty 6

## 2011-06-16 MED ORDER — POTASSIUM CHLORIDE CRYS ER 20 MEQ PO TBCR: 40.0000 meq | EXTENDED_RELEASE_TABLET | Freq: Once | ORAL | Status: DC

## 2011-06-16 MED ORDER — FULVESTRANT 250 MG/5ML IM SOLN
500.0000 mg | Freq: Once | INTRAMUSCULAR | Status: AC
  Administered 2011-06-16: 500 mg via INTRAMUSCULAR
  Filled 2011-06-16: qty 10

## 2011-06-16 MED ORDER — DENOSUMAB 120 MG/1.7ML ~~LOC~~ SOLN
120.0000 mg | Freq: Once | SUBCUTANEOUS | Status: AC
  Administered 2011-06-16: 120 mg via SUBCUTANEOUS
  Filled 2011-06-16: qty 1.7

## 2011-06-16 MED ORDER — SODIUM CHLORIDE 0.9 % IJ SOLN
10.0000 mL | INTRAMUSCULAR | Status: DC | PRN
  Administered 2011-06-16: 10 mL
  Filled 2011-06-16: qty 10

## 2011-06-16 MED ORDER — SODIUM CHLORIDE 0.9 % IV SOLN
Freq: Once | INTRAVENOUS | Status: AC
  Administered 2011-06-16: 16:00:00 via INTRAVENOUS

## 2011-06-16 MED ORDER — POTASSIUM CHLORIDE 10 MEQ/50ML IV SOLN
10.0000 meq | INTRAVENOUS | Status: AC
  Administered 2011-06-16 (×4): 10 meq via INTRAVENOUS
  Filled 2011-06-16 (×4): qty 50

## 2011-06-16 MED ORDER — GOSERELIN ACETATE 3.6 MG ~~LOC~~ IMPL
3.6000 mg | DRUG_IMPLANT | Freq: Once | SUBCUTANEOUS | Status: AC
  Administered 2011-06-16: 3.6 mg via SUBCUTANEOUS
  Filled 2011-06-16: qty 3.6

## 2011-06-16 MED ORDER — HEPARIN SOD (PORK) LOCK FLUSH 100 UNIT/ML IV SOLN
500.0000 [IU] | Freq: Once | INTRAVENOUS | Status: AC | PRN
  Administered 2011-06-16: 500 [IU]
  Filled 2011-06-16: qty 5

## 2011-06-16 MED ORDER — DIGOXIN 125 MCG PO TABS
0.1250 mg | ORAL_TABLET | Freq: Every day | ORAL | Status: DC
  Administered 2011-06-16: 0.125 mg via ORAL
  Filled 2011-06-16: qty 1

## 2011-06-16 MED ORDER — ACETAMINOPHEN 325 MG PO TABS
650.0000 mg | ORAL_TABLET | Freq: Once | ORAL | Status: AC
  Administered 2011-06-16: 650 mg via ORAL

## 2011-06-16 MED ORDER — DIGOXIN 125 MCG PO TABS: 0.1250 mg | ORAL_TABLET | Freq: Every day | ORAL | Status: DC

## 2011-06-16 NOTE — Progress Notes (Signed)
41 yo with stage 4 breast ca well known to me; hx of CHF associated herceptin.Admitted with n/v . Scans last pm do not show any evidence of obstruction but do demonstrate evidence of progression. I discussed this with her and we will need to adjust her treatment .

## 2011-06-16 NOTE — Progress Notes (Signed)
D/c orders received; order to still have porta cath assessed upon d/c for pts outpt chemo today received; pt remains in stable condition; pt meds and instructions reviewed and given to pt; pt d/c to home

## 2011-06-16 NOTE — Progress Notes (Signed)
UR Completed. Simmons, Garner Dullea F [phone]  

## 2011-06-16 NOTE — Progress Notes (Signed)
  Echocardiogram 2D Echocardiogram has been performed.  Graylyn Bunney Nira Retort 06/16/2011, 10:35 AM

## 2011-06-16 NOTE — Progress Notes (Signed)
Advanced Heart Failure  Subjective:   Kathy Jimenez is a 41 y.o. female with L breast CA in 2004 s/p lumpectomy and treatment with FEC (flourouracil, epirubicin, cyclophosphamide). Also treated with hormonal therapy. 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 Started on weekly herceptin. Recently also started on Perjeta and Tykerb. Dr. Donnie Coffin evaluated her for a cough and progressive DOE; CT which showed dilated heart with pleural effusion and echo with EF 25% with global HK. Mod MR/TR, lateral s' 6.1 sec/m. Admitted to the hospital for volume overload and tachycardia. Diuresed 9 lbs, discharge weight 151 lbs. With history of severe kidney stones, lasix 20 mg given prn.   Followed closely in HF clinic due to cardiotoxicity associated with Herceptin. She was placed on digoxin, Ace Inhibitor, Beta Blocker. Hercpetin was placed on hold for one cycle and later resumed after repeat ECHO revealed improved Lateral S'.   She returned to Heart Failure Clinic today with complaints of persistent nausea and vomiting for the last 5 days. She discussed ongoing nausea with Dr Donnie Coffin and Tykerb was stopped on Saturday. Developed abdominal pain in the mid-epigastic pain over the last 12 hours. Vomiting multiple times throughout the day that does not improve with Zofran. Increased fatigue. She has had 2-3 bowel movements daily. Received 1 liter NS in HF clinic with no improvement.   Admitted yesterday from heart failure clinic with persistent nausea/vomiting/abdominal pain. Received 3 liters of fluid yesterday. Denies abdominal pain. Intermittent nausea. Tolerated clear liquids.        Intake/Output Summary (Last 24 hours) at 06/16/11 0736 Last data filed at 06/16/11 0500  Gross per 24 hour  Intake    375 ml  Output    650 ml  Net   -275 ml    Current meds:    . carvedilol  6.25 mg Oral BID WC  . enoxaparin   40 mg Subcutaneous Q24H  . iohexol  20 mL Oral Q1 Hr x 2  . lisinopril  10 mg Oral Daily  . metoCLOPramide  10 mg Oral TID WC & HS  . sodium chloride  3 mL Intravenous Q12H   Infusions:    . dextrose 5 % and 0.45% NaCl 75 mL/hr at 06/15/11 2300     Objective:  Blood pressure 111/70, pulse 74, temperature 98.3 F (36.8 C), temperature source Oral, resp. rate 18, height 5\' 4"  (1.626 m), weight 142 lb 14.4 oz (64.819 kg), last menstrual period 09/28/2002, SpO2 97.00%. Weight change:   Physical Exam: General:  Well appearing. No resp difficulty HEENT: normal Neck: supple. JVP . Carotids 2+ bilat; no bruits. No lymphadenopathy or thryomegaly appreciated. Cor: PMI nondisplaced. Regular rate & rhythm. No rubs, gallops or murmurs. Lungs: clear Abdomen: soft, nontender, nondistended. No hepatosplenomegaly. No bruits or masses. Hypoactive bowel sounds. Extremities: no cyanosis, clubbing, rash, edema Neuro: alert & orientedx3, cranial nerves grossly intact. moves all 4 extremities w/o difficulty. Affect pleasant  Telemetry: SR  Lab Results: Basic Metabolic Panel:  Lab 06/16/11 9562 06/15/11 1010 06/15/11 06/09/11 1511  NA 137 138 136 139  K 3.3* 3.5 -- --  CL 106 104 104 105  CO2 22 24 22 27   GLUCOSE 101* 104* 132* 94  BUN 10 17 13 13   CREATININE 0.84 0.98 0.93 0.87  CALCIUM 7.9* 9.6 8.2* 10.0  MG -- -- -- --  PHOS -- -- -- --   Liver Function Tests:  Lab 06/15/11 1010 06/09/11 1511  AST 29 27  ALT 26 27  ALKPHOS 100 88  BILITOT 1.0 0.6  PROT 7.2 6.9  ALBUMIN 3.8 3.6    Lab 06/15/11  LIPASE 36  AMYLASE 77   No results found for this basename: AMMONIA:5 in the last 168 hours CBC:  Lab 06/15/11 1010 06/15/11 06/09/11 1512  WBC 7.1 6.5 6.3  NEUTROABS -- -- 3.8  HGB 12.9 11.2* 12.7  HCT 35.9* 30.7* 35.2  MCV 89.8 89.2 88.9  PLT 195 159 227   Cardiac Enzymes: No results found for this basename: CKTOTAL:5,CKMB:5,CKMBINDEX:5,TROPONINI:5 in the last 168  hours BNP: No components found with this basename: POCBNP:5 CBG: No results found for this basename: GLUCAP:5 in the last 168 hours Microbiology: Lab Results  Component Value Date   CULT INSIGNIFICANT GROWTH 10/11/2010   No results found for this basename: CULT:2,SDES:2 in the last 168 hours  Imaging: Ct Head W Wo Contrast  06/15/2011  *RADIOLOGY REPORT*  Clinical Data: History of breast cancer.  Evaluate for brain metastases.  CT HEAD WITHOUT AND WITH CONTRAST  Technique:  Contiguous axial images were obtained from the base of the skull through the vertex without and with intravenous contrast.  Contrast:  80 ml of omni 300  Comparison: 10/05/2010  Findings: The brain has a normal appearance without evidence for hemorrhage, infarction, hydrocephalus, or mass lesion.  There is no extra axial fluid collection.  The skull and paranasal sinuses are normal.  IMPRESSION:  1.  Normal appearance of the brain.  No specific features identified to suggest bone metastases.  Original Report Authenticated By: Rosealee Albee, M.D.   Ct Abdomen Pelvis W Contrast  06/15/2011  *RADIOLOGY REPORT*  Clinical Data: Abdominal pain with nausea and vomiting.  Metastatic breast cancer.  CT ABDOMEN AND PELVIS WITH CONTRAST  Technique:  Multidetector CT imaging of the abdomen and pelvis was performed following the standard protocol during bolus administration of intravenous contrast.  Contrast: 80mL OMNIPAQUE IOHEXOL 300 MG/ML IJ SOLN  Comparison: CT scan dated 12/20/2010 and PET CT scan dated 03/20/2011  Findings: The metastatic lesions in the right and left lobes of the liver have progressed in size and number.  The metastases in the left lobe are immediately adjacent to the lesser curvature of the stomach.  The spleen, pancreas, adrenal glands, and kidneys demonstrate no acute abnormalities.  The bowel is normal.  Uterus and ovaries are normal.  No free air or free fluid.  Sclerotic metastases in the spine and pelvis are  unchanged.  No new osseous lesions.  Lung bases are clear.  IMPRESSION: Progressive metastatic disease in the liver.  No other change.  Original Report Authenticated By: Gwynn Burly, M.D.   Dg Abd Portable 1v  06/15/2011  *RADIOLOGY REPORT*  Clinical Data: 41 year old female with abdominal pain nausea and vomiting.  History of breast cancer.  PORTABLE ABDOMEN - 1 VIEW  Technique: Portable supine AP view of the abdomen 1109 hours.  Comparison:   03/24/2011.  Findings: Nonobstructed bowel gas pattern.Abdominal and pelvic visceral contours are within normal limits. Stable visualized osseous structures.    IMPRESSION:  Nonobstructed bowel gas pattern.  Original Report Authenticated By: Harley Hallmark, M.D.     ASSESSMENT:  1. Abdominal Pain  2. Persistent n/v  3, Chronic Systolic Heart Failure - previous EF 20-25%. Most recently 50%  4. Metastatic breast cancer (metastasized to liver) 5. Hypokalemia   PLAN/DISCUSSION: Continues to complain of intermittent nausea. Hypoactive bowel sounds.  Continue clear liquid. Dr Donnie Coffin to discuss CT results this am.   Will continue to hold lasix and spironolactone.  Digoxin level 1.2. Will restart digoxin 0.125 mg daily. Supplement potassium.    LOS: 1 day    CLEGG,AMY, NP 06/16/2011, 7:36 AM Patient seen and examined with Tonye Becket, NP. We discussed all aspects of the encounter. I agree with the assessment and plan as stated above. Patient much improved. Will be discharged today. CT scan and echo results discussed. See D/C summary for full details.  Kathy Jimenez

## 2011-06-16 NOTE — Patient Instructions (Signed)
[facility] Discharge Instructions for Patients Receiving Chemotherapy  Today you received the following chemotherapy agent: Herceptin  To help prevent nausea and vomiting after your treatment, we encourage you to take your nausea medications: Phenergan and Zofran as directed .   If you develop nausea and vomiting that is not controlled by your nausea medication, call the clinic. If it is after clinic hours your family physician or the after hours number for the clinic or go to the Emergency Department.   BELOW ARE SYMPTOMS THAT SHOULD BE REPORTED IMMEDIATELY:  *FEVER GREATER THAN 100.5 F  *CHILLS WITH OR WITHOUT FEVER  NAUSEA AND VOMITING THAT IS NOT CONTROLLED WITH YOUR NAUSEA MEDICATION  *UNUSUAL SHORTNESS OF BREATH  *UNUSUAL BRUISING OR BLEEDING  TENDERNESS IN MOUTH AND THROAT WITH OR WITHOUT PRESENCE OF ULCERS  *URINARY PROBLEMS  *BOWEL PROBLEMS  UNUSUAL RASH Items with * indicate a potential emergency and should be followed up as soon as possible.   Feel free to call the clinic you have any questions or concerns. The clinic phone number is [phone].  Follow up with Dr. Donnie Coffin early next week--will need to change chemo appointment time if Taxol is added.   I have been informed and understand all the instructions given to me. I know to contact the clinic, my physician, or go to the Emergency Department if any problems should occur. I do not have any questions at this time, but understand that I may call the clinic during office hours   should I have any questions or need assistance in obtaining follow up care.    __________________________________________  _____________  __________ Signature of Patient or Authorized Representative            Date                   Time    __________________________________________ Nurse's Signature

## 2011-06-16 NOTE — Telephone Encounter (Signed)
THIS REQUEST WAS GIVEN TO DR.RUBIN'S NURSE, JAN MYERS,RN. 

## 2011-06-16 NOTE — Discharge Summary (Signed)
Patient ID: Kathy Jimenez MRN: [id number] DOB/AGE: 1970-06-03 41 y.o.  Admit date: 06/15/2011 Discharge date: 06/16/2011  Primary Discharge Diagnosis 1) Nausea and vomiting 2) Abdominal pain  Secondary Discharge Diagnosis 1) Metastatic breast CA 2) Herceptin cardiotoxicity - resolved   Hospital Course: Kathy Jimenez is a 41 y/o woman with Kathy Jimenez with metastatic breast CA. Recently developed Herceptin cardiotoxicity with EF ~ 25%. Was treated aggressively in HF clinic and EF recovered to 50%. She was started back on herceptin and Tykerb about 3 weeks ago.  She returned to [facility] Clinic on 3/7 with complaints of persistent nausea and vomiting for 5 days. She discussed with Dr Donnie Coffin and Tykerb was stopped. Developed abdominal pain in the mid-epigastic pain over the last 12 hours. Vomiting multiple times throughout the day that does not improve with Zofran. Increased fatigue. Received 1 liter NS in HF clinic with no improvement. So was admitted for IVF and anti-emetic therapy.  Symptoms much improved fluids/zofran and phenergan. Labs normal. Underwent CT ab/pelvis/brain. CT notable for increasing size of liver metastases but not acute process to explain N/V. There was no evidence of obstruction or biliary/pancreatic pathology. N/V felt to be due to chemotherapy and progression of liver metastases.   On the morning of discharge her nausea resolved and was able to hold down solid food. Echo with EF 55%. Lasix and spironolactone held.   Case was discussed with Dr. Donnie Coffin who agreed with discharge to resume chemotherapy.     Discharge Info: Blood pressure 98/65, pulse 80, temperature 98.3 F (36.8 C), temperature source Oral, resp. rate 18, height 5\' 4"  (1.626 m), weight 142 lb 14.4 oz (64.819 kg), last menstrual period 09/28/2002, SpO2 97.00%.    General:  Well appearing. No resp difficulty HEENT: normal Neck: supple. no JVD. Carotids 2+ bilat; no bruits. No lymphadenopathy or thryomegaly  appreciated. Cor: PMI nondisplaced. Regular rate & rhythm. No rubs, gallops or murmurs. Lungs: clear Abdomen: soft, nontender, nondistended. No hepatosplenomegaly. No bruits or masses. Good bowel sounds. Extremities: no cyanosis, clubbing, rash, edema Neuro: alert & orientedx3, cranial nerves grossly intact. moves all 4 extremities w/o difficulty. Affect pleasant  Results for orders placed during the hospital encounter of 06/15/11 (from the past 24 hour(s))  BASIC METABOLIC PANEL     Status: Abnormal   Collection Time   06/16/11  5:30 AM      Component Value Range   Sodium 137  135 - 145 (mEq/L)   Potassium 3.3 (*) 3.5 - 5.1 (mEq/L)   Chloride 106  96 - 112 (mEq/L)   CO2 22  19 - 32 (mEq/L)   Glucose, Bld 101 (*) 70 - 99 (mg/dL)   BUN 10  6 - 23 (mg/dL)   Creatinine, Ser 4.09  0.50 - 1.10 (mg/dL)   Calcium 7.9 (*) 8.4 - 10.5 (mg/dL)   GFR calc non Af Amer 85 (*) >90 (mL/min)   GFR calc Af Amer >90  >90 (mL/min)     Discharge Medications: Medication List  As of 06/16/2011 12:34 PM   STOP taking these medications         furosemide 20 MG tablet      lapatinib 250 MG tablet      spironolactone 25 MG tablet         TAKE these medications         capecitabine 500 MG tablet   Commonly known as: XELODA   Take 1,000-1,500 mg by mouth 2 (two) times daily after a meal. Pt  takes 3 tabs in the morning and 2 tabs at night. 1 week on and 1 week off. Pt is currently on      carvedilol 6.25 MG tablet   Commonly known as: COREG   Take 2 tablets (12.5 mg total) by mouth 2 (two) times daily with a meal.      digoxin 0.125 MG tablet   Commonly known as: LANOXIN   Take 1 tablet (0.125 mg total) by mouth daily.      lisinopril 10 MG tablet   Commonly known as: PRINIVIL,ZESTRIL   Take 1 tablet (10 mg total) by mouth 2 (two) times daily.      promethazine 6.25 MG/5ML syrup   Commonly known as: PHENERGAN   Take 10 mLs (12.5 mg total) by mouth 4 (four) times daily as needed.      SODIUM  CITRATE PO   Take 1 tablet by mouth 2 (two) times daily.            Follow-up Plans & Instructions: Discharge Orders    Future Appointments: Provider: Department: Dept Phone: Center:   06/16/2011 3:00 PM Alvina Filbert [facility] [phone] None   06/16/2011 3:15 PM [facility] [phone] None   06/22/2011 9:15 AM [facility] [phone] None   06/23/2011 3:00 PM Krista Blue [facility] [phone] None   06/23/2011 3:30 PM [facility] F19 [facility] [phone] None   06/30/2011 3:00 PM Radene Gunning [facility] [phone] None   06/30/2011 3:30 PM [facility] C9 [facility] [phone] None   07/07/2011 3:00 PM Radene Gunning [facility] [phone] None   07/07/2011 3:30 PM [facility] C10 [facility] [phone] None     Future Orders Please Complete By Expires   Diet - low sodium heart healthy      Increase activity slowly      Call MD for:  persistant nausea and vomiting      Call MD for:  severe uncontrolled pain      Call MD for:  difficulty breathing, headache or visual disturbances      (HEART FAILURE PATIENTS) Call MD:  Anytime you have any of the following symptoms: 1) 3 pound weight gain in 24 hours or 5 pounds in 1 week 2) shortness of breath, with or without a dry hacking cough 3) swelling in the hands, feet or stomach 4) if you have to sleep on extra pillows at night in order to breathe.      Care order/instruction      Scheduling Instructions:   D/C after Dr Gala Romney evaluates     Follow-up Information    Follow up with Arvilla Meres, MD on 06/22/2011. ( 9:15 Garage Code  [phone])    Contact information:   [address] [place] [address] [phone]           BRING ALL MEDICATIONS WITH YOU TO FOLLOW UP APPOINTMENTS  Time spent with patient to include physician time:30 Signed:  CLEGG,AMY, NP 06/16/2011, 12:34 PM   Patient seen and examined  with Tonye Becket, NP. We discussed all aspects of the encounter. I agree with the assessment and plan as stated above. She looks much better on exam. Able to tolerate po intake.  No evidence of HF. ECHO and CT scans reviewed with her in detail. Case discussed with Dr. Donnie Coffin. She will follow-up with oncology clinic.   Nickey Kloepfer,MD 12:50 PM

## 2011-06-19 MED ORDER — CAPECITABINE 500 MG PO TABS: ORAL_TABLET | ORAL | Status: DC

## 2011-06-19 NOTE — Telephone Encounter (Signed)
Addended by: Arvilla Meres on: 06/19/2011 12:13 PM   Modules accepted: Orders

## 2011-06-22 ENCOUNTER — Encounter: Payer: Self-pay | Admitting: *Deleted

## 2011-06-22 ENCOUNTER — Ambulatory Visit (HOSPITAL_COMMUNITY)
Admission: RE | Admit: 2011-06-22 | Discharge: 2011-06-22 | Disposition: A | Discharge status: Home / Self Care | Payer: BC Managed Care – PPO | Source: Ambulatory Visit | Attending: Internal Medicine | Admitting: Internal Medicine

## 2011-06-22 ENCOUNTER — Other Ambulatory Visit: Payer: Self-pay | Admitting: *Deleted

## 2011-06-22 VITALS — BP 92/50 | HR 52 | Ht 64.0 in | Wt 146.5 lb

## 2011-06-22 DIAGNOSIS — C787 Secondary malignant neoplasm of liver and intrahepatic bile duct: Secondary | ICD-10-CM

## 2011-06-22 DIAGNOSIS — I5021 Acute systolic (congestive) heart failure: Secondary | ICD-10-CM

## 2011-06-22 DIAGNOSIS — C50919 Malignant neoplasm of unspecified site of unspecified female breast: Secondary | ICD-10-CM

## 2011-06-22 DIAGNOSIS — C801 Malignant (primary) neoplasm, unspecified: Secondary | ICD-10-CM

## 2011-06-22 DIAGNOSIS — I5022 Chronic systolic (congestive) heart failure: Secondary | ICD-10-CM | POA: Insufficient documentation

## 2011-06-22 NOTE — Assessment & Plan Note (Signed)
Per Dr. Donnie Coffin, will continue with herceptin and add TDM1.  Will continue to follow serial echos.

## 2011-06-22 NOTE — Assessment & Plan Note (Addendum)
She has had recovery of her EF per echo 06/16/11.  She does not have fluid on board today.  Continue sliding scale lasix.  Will continue current regimen and follow up in 8 weeks with echo.    Patient seen and examined with Tonye Becket, NP. We discussed all aspects of the encounter. I agree with the assessment and plan as stated above. No HF on exam. We reviewed echo again and LV has recovered completely on echo. Stressed need to continue HF meds particularly in face of resuming Herceptin therapy.D/w Dr. Donnie Coffin.

## 2011-06-22 NOTE — Progress Notes (Signed)
Patient ID: Kathy Jimenez, female   DOB: Sep 22, 1970, 41 y.o.   MRN: [id number] HPI:  Kathy Jimenez is a 41 y.o. female with L breast CA in 2004 s/p lumpectomy and treatment with FEC (flourouracil, epirubicin, cyclophosphamide). Also treated with hormonal therapy. 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  Started on weekly herceptin. Recently also started on Perjeta and Tykerb. Dr. Donnie Coffin evaluated her for a cough and progressive DOE; CT which showed dilated heart with pleural effusion and echo with EF 25% with global HK. Mod MR/TR, lateral s' 6.1 sec/m. Admitted to the hospital for volume overload and tachycardia.  Diuresed 9 lbs, discharge weight 151 lbs.  With history of severe kidney stones, lasix 20 mg given prn.    03/23/11: Echo EF 25-30%, lateral s' 6.4 sec/m, LVIDd improved from 4.9 to 3.4.  05/24/11: Echo: EF 45-50%, diffuse hypokinesis 06/16/11:  Echo: EF 60-65%  Kathy Jimenez was recently hospitalized for N/V requiring IVF.  She returns today for follow up.  She continues to have constant N but her vomiting has ceased.  She feels ok but there is a constant feeling of a getting hit in the gut with a baseball.  She discussed with Dr. Donnie Coffin further treatments and they have decided to start TDM1/herceptin on Friday.  She ate some [place] food the other day with some swelling in her hands and feet that resolved with lasix.  She continues to work full time.      ROS: All systems negative except as listed in HPI, PMH and Problem List.  Past Medical History  Diagnosis Date  . 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 Kidney stones     recurrent since 1989  . Endometrial polyp   . PONV (postoperative nausea and vomiting)   . CHF (congestive heart failure)     from chemo treatment    Current Outpatient Prescriptions  Medication Sig Dispense Refill  . capecitabine (XELODA) 500  MG tablet Pt takes twice a day. 3 tabs in the morning and 2 tabs at night. 1 week on and 1 week off.  70 tablet  4  . carvedilol (COREG) 12.5 MG tablet Take 12.5 mg by mouth 2 (two) times daily with a meal.      . digoxin (LANOXIN) 0.125 MG tablet Take 1 tablet (0.125 mg total) by mouth daily.  30 tablet  6  . lisinopril (PRINIVIL,ZESTRIL) 10 MG tablet Take 1 tablet (10 mg total) by mouth 2 (two) times daily.  60 tablet  6  . ondansetron (ZOFRAN) 8 MG tablet Take 8 mg by mouth every 8 (eight) hours as needed.      . promethazine (PHENERGAN) 6.25 MG/5ML syrup Take 10 mLs (12.5 mg total) by mouth 4 (four) times daily as needed.  120 mL  2  . SODIUM CITRATE PO Take 1 tablet by mouth 2 (two) times daily.          PHYSICAL EXAM: Filed Vitals:   06/22/11 0919  BP: 92/50  Pulse: 52  Height: 5\' 4"  (1.626 m)  Weight: 146 lb 8 oz (66.452 kg)  SpO2: 100%   General:  Well appearing.  No resp difficulty HEENT: normal Neck: supple. JVP flat. Carotids 2+ bilaterally; no bruits. No lymphadenopathy or thryomegaly appreciated. Cor: PMI normal. Regular rate & rhythm. No S3.  Trivial TR murmur.   Lungs: clear Abdomen:  soft. Mildly tender diffusely, nondistended. No hepatosplenomegaly. No bruits or masses. Very hypoactive bowel sounds. Extremities: no cyanosis, clubbing, rash, edema Neuro: alert & orientedx3, cranial nerves grossly intact. Moves all 4 extremities w/o difficulty. Affect pleasant.    ASSESSMENT & PLAN:

## 2011-06-22 NOTE — Patient Instructions (Addendum)
Continue current medications.   Follow up in 4 weeks.   Call to let us know when to schedule your echo.

## 2011-06-23 ENCOUNTER — Ambulatory Visit (HOSPITAL_BASED_OUTPATIENT_CLINIC_OR_DEPARTMENT_OTHER): Payer: BC Managed Care – PPO

## 2011-06-23 ENCOUNTER — Other Ambulatory Visit: Payer: Self-pay | Admitting: Oncology

## 2011-06-23 ENCOUNTER — Other Ambulatory Visit (HOSPITAL_BASED_OUTPATIENT_CLINIC_OR_DEPARTMENT_OTHER): Payer: BC Managed Care – PPO | Admitting: Lab

## 2011-06-23 VITALS — BP 123/83 | HR 67 | Temp 97.2°F

## 2011-06-23 DIAGNOSIS — C50919 Malignant neoplasm of unspecified site of unspecified female breast: Secondary | ICD-10-CM

## 2011-06-23 DIAGNOSIS — C801 Malignant (primary) neoplasm, unspecified: Secondary | ICD-10-CM

## 2011-06-23 DIAGNOSIS — I5021 Acute systolic (congestive) heart failure: Secondary | ICD-10-CM

## 2011-06-23 DIAGNOSIS — Z5112 Encounter for antineoplastic immunotherapy: Secondary | ICD-10-CM

## 2011-06-23 DIAGNOSIS — C787 Secondary malignant neoplasm of liver and intrahepatic bile duct: Secondary | ICD-10-CM

## 2011-06-23 LAB — COMPREHENSIVE METABOLIC PANEL
Alkaline Phosphatase: 85 U/L (ref 39–117)
BUN: 15 mg/dL (ref 6–23)
Creatinine, Ser: 0.99 mg/dL (ref 0.50–1.10)
Glucose, Bld: 107 mg/dL — ABNORMAL HIGH (ref 70–99)
Sodium: 142 mEq/L (ref 135–145)
Total Bilirubin: 0.5 mg/dL (ref 0.3–1.2)

## 2011-06-23 LAB — CBC WITH DIFFERENTIAL/PLATELET
Basophils Absolute: 0 10*3/uL (ref 0.0–0.1)
Eosinophils Absolute: 0.2 10*3/uL (ref 0.0–0.5)
LYMPH%: 20.7 % (ref 14.0–49.7)
MCV: 91.6 fL (ref 79.5–101.0)
MONO%: 9.6 % (ref 0.0–14.0)
NEUT#: 3.7 10*3/uL (ref 1.5–6.5)
NEUT%: 66.6 % (ref 38.4–76.8)
Platelets: 223 10*3/uL (ref 145–400)
RBC: 3.81 10*6/uL (ref 3.70–5.45)

## 2011-06-23 MED ORDER — ACETAMINOPHEN 325 MG PO TABS
650.0000 mg | ORAL_TABLET | Freq: Once | ORAL | Status: AC
  Administered 2011-06-23: 650 mg via ORAL

## 2011-06-23 MED ORDER — SODIUM CHLORIDE 0.9 % IV SOLN
3.6000 mg/kg | Freq: Once | INTRAVENOUS | Status: AC
  Administered 2011-06-23: 240 mg via INTRAVENOUS
  Filled 2011-06-23: qty 12

## 2011-06-23 MED ORDER — DIPHENHYDRAMINE HCL 25 MG PO CAPS
50.0000 mg | ORAL_CAPSULE | Freq: Once | ORAL | Status: AC
  Administered 2011-06-23: 50 mg via ORAL

## 2011-06-23 MED ORDER — SODIUM CHLORIDE 0.9 % IV SOLN
Freq: Once | INTRAVENOUS | Status: AC
  Administered 2011-06-23: 10:00:00 via INTRAVENOUS

## 2011-06-23 MED ORDER — HEPARIN SOD (PORK) LOCK FLUSH 100 UNIT/ML IV SOLN
500.0000 [IU] | Freq: Once | INTRAVENOUS | Status: AC | PRN
  Administered 2011-06-23: 500 [IU]
  Filled 2011-06-23: qty 5

## 2011-06-23 MED ORDER — SODIUM CHLORIDE 0.9 % IJ SOLN
10.0000 mL | INTRAMUSCULAR | Status: DC | PRN
  Administered 2011-06-23: 10 mL
  Filled 2011-06-23: qty 10

## 2011-06-23 NOTE — Progress Notes (Signed)
1155-Kadcyla complete at 1145.  Per protocol, pt to stay for observation for 90 minutes s/p Kadcyla.  Pt refused to stay for 90 minute observation d/t work demands.  Dr. Donnie Coffin notified. Pt instructed to notify MD with any concerns or complaints.

## 2011-06-23 NOTE — Patient Instructions (Signed)
[facility] Discharge Instructions for Patients Receiving Chemotherapy  Today you received the following chemotherapy agents Kadcyla.  To help prevent nausea and vomiting after your treatment, we encourage you to take your nausea medication as ordered per MD.    If you develop nausea and vomiting that is not controlled by your nausea medication, call the clinic. If it is after clinic hours your family physician or the after hours number for the clinic or go to the Emergency Department.   BELOW ARE SYMPTOMS THAT SHOULD BE REPORTED IMMEDIATELY:  *FEVER GREATER THAN 100.5 F  *CHILLS WITH OR WITHOUT FEVER  NAUSEA AND VOMITING THAT IS NOT CONTROLLED WITH YOUR NAUSEA MEDICATION  *UNUSUAL SHORTNESS OF BREATH  *UNUSUAL BRUISING OR BLEEDING  TENDERNESS IN MOUTH AND THROAT WITH OR WITHOUT PRESENCE OF ULCERS  *URINARY PROBLEMS  *BOWEL PROBLEMS  UNUSUAL RASH Items with * indicate a potential emergency and should be followed up as soon as possible.  . Please let the nurse know about any problems that you may have experienced. Feel free to call the clinic you have any questions or concerns. The clinic phone number is [phone].   I have been informed and understand all the instructions given to me. I know to contact the clinic, my physician, or go to the Emergency Department if any problems should occur. I do not have any questions at this time, but understand that I may call the clinic during office hours   should I have any questions or need assistance in obtaining follow up care.    __________________________________________  _____________  __________ Signature of Patient or Authorized Representative            Date                   Time    __________________________________________ Nurse's Signature    

## 2011-06-26 ENCOUNTER — Telehealth: Payer: Self-pay | Admitting: *Deleted

## 2011-06-26 NOTE — Telephone Encounter (Signed)
patient confirmed over the phone the new date and time of the lab and midlevel appointment

## 2011-06-27 ENCOUNTER — Telehealth: Payer: Self-pay | Admitting: *Deleted

## 2011-06-27 NOTE — Telephone Encounter (Signed)
AWAITING A RETURN CALL. 

## 2011-06-29 ENCOUNTER — Ambulatory Visit (HOSPITAL_BASED_OUTPATIENT_CLINIC_OR_DEPARTMENT_OTHER): Payer: BC Managed Care – PPO | Admitting: Physician Assistant

## 2011-06-29 ENCOUNTER — Encounter: Payer: Self-pay | Admitting: Physician Assistant

## 2011-06-29 ENCOUNTER — Other Ambulatory Visit (HOSPITAL_BASED_OUTPATIENT_CLINIC_OR_DEPARTMENT_OTHER): Payer: BC Managed Care – PPO | Admitting: Lab

## 2011-06-29 VITALS — BP 131/82 | HR 92 | Temp 98.1°F | Ht 64.0 in | Wt 145.4 lb

## 2011-06-29 DIAGNOSIS — I509 Heart failure, unspecified: Secondary | ICD-10-CM

## 2011-06-29 DIAGNOSIS — C50919 Malignant neoplasm of unspecified site of unspecified female breast: Secondary | ICD-10-CM

## 2011-06-29 DIAGNOSIS — C787 Secondary malignant neoplasm of liver and intrahepatic bile duct: Secondary | ICD-10-CM

## 2011-06-29 DIAGNOSIS — C7951 Secondary malignant neoplasm of bone: Secondary | ICD-10-CM

## 2011-06-29 LAB — CBC WITH DIFFERENTIAL/PLATELET
Eosinophils Absolute: 0.2 10*3/uL (ref 0.0–0.5)
HCT: 37.9 % (ref 34.8–46.6)
HGB: 12.9 g/dL (ref 11.6–15.9)
LYMPH%: 27.6 % (ref 14.0–49.7)
MONO#: 0.7 10*3/uL (ref 0.1–0.9)
NEUT#: 3.5 10*3/uL (ref 1.5–6.5)
NEUT%: 56.5 % (ref 38.4–76.8)
Platelets: 133 10*3/uL — ABNORMAL LOW (ref 145–400)
WBC: 6.2 10*3/uL (ref 3.9–10.3)
lymph#: 1.7 10*3/uL (ref 0.9–3.3)

## 2011-06-29 LAB — COMPREHENSIVE METABOLIC PANEL
CO2: 30 mEq/L (ref 19–32)
Calcium: 10.3 mg/dL (ref 8.4–10.5)
Chloride: 100 mEq/L (ref 96–112)
Creatinine, Ser: 1 mg/dL (ref 0.50–1.10)
Glucose, Bld: 137 mg/dL — ABNORMAL HIGH (ref 70–99)
Total Bilirubin: 0.7 mg/dL (ref 0.3–1.2)
Total Protein: 7.4 g/dL (ref 6.0–8.3)

## 2011-06-29 LAB — LACTATE DEHYDROGENASE: LDH: 854 U/L — ABNORMAL HIGH (ref 94–250)

## 2011-06-29 NOTE — Progress Notes (Signed)
Hematology and Oncology Follow Up Visit  Kathy Jimenez [id number] Feb 14, 1971 41 y.o. 06/29/2011    HPI: Patient is a 41 year old [place] woman with a metastatic ER/HER-2 positive breast carcinoma with known bone and liver involvement. Currently day 6 of cycle 1 of every 3 week Kadcyla. She also receives monthly Faslodex, Zoladex, and Xgeva.  Interim History:   Kathy Jimenez is seen today in assessment following her first cycle of every 3 week Kadcyla. Today she is feeling quite well, stating that overall she has done well though she did experience a low-grade temperature reported at 100.8 about 2 days after infusion. She also admits that she was quite fatigued during this interval, and had experienced easily chilling. Day before, she did admit to some joint and spine pain almost like she "had the flu", but this has completely abated. She denies shortness of breath or chest pain. No nausea or emesis. No diarrhea or constipation issues. She still has some vague abdominal pain primarily in the epigastric region. She feels though that this has improved a bit. She's had no episodes of jaundice.  A detailed review of systems is otherwise noncontributory as noted below.  Review of Systems: Constitutional:  no weight loss, fever, night sweats and feels well Eyes: no complaints ENT: no complaints Cardiovascular: no chest pain or dyspnea on exertion Respiratory: no cough, shortness of breath, or wheezing Neurological: no TIA or stroke symptoms Dermatological: negative Gastrointestinal: no abdominal pain, change in bowel habits, or black or bloody stools Genito-Urinary: no dysuria, trouble voiding, or hematuria Hematological and Lymphatic: negative Breast: negative Musculoskeletal: negative Remaining ROS negative.   Medications:   I have reviewed the patient's current medications.  Current Outpatient Prescriptions  Medication Sig Dispense Refill  . carvedilol (COREG) 12.5 MG tablet  Take 12.5 mg by mouth 2 (two) times daily with a meal.      . digoxin (LANOXIN) 0.125 MG tablet Take 1 tablet (0.125 mg total) by mouth daily.  30 tablet  6  . lisinopril (PRINIVIL,ZESTRIL) 10 MG tablet Take 1 tablet (10 mg total) by mouth 2 (two) times daily.  60 tablet  6  . ondansetron (ZOFRAN) 8 MG tablet Take 8 mg by mouth every 8 (eight) hours as needed.      . promethazine (PHENERGAN) 6.25 MG/5ML syrup Take 10 mLs (12.5 mg total) by mouth 4 (four) times daily as needed.  120 mL  2  . SODIUM CITRATE PO Take 1 tablet by mouth 2 (two) times daily.       . capecitabine (XELODA) 500 MG tablet Pt takes twice a day. 3 tabs in the morning and 2 tabs at night. 1 week on and 1 week off.  70 tablet  4    Allergies:  Allergies  Allergen Reactions  . Sulfa Antibiotics Hives    Physical Exam: Filed Vitals:   06/29/11 1502  BP: 131/82  Pulse: 92  Temp: 98.1 F (36.7 C)    Body mass index is 24.96 kg/(m^2). Weight: 145 lbs. HEENT:  Sclerae anicteric, conjunctivae pink.  Oropharynx clear.  No mucositis or candidiasis.   Nodes:  No cervical, supraclavicular, or axillary lymphadenopathy palpated.   Lungs:  Clear to auscultation bilaterally.  No crackles, rhonchi, or wheezes.   Heart:  Regular rate and rhythm.   Abdomen:    Musculoskeletal:  No focal spinal tenderness to palpation.  Extremities:  Benign.  No peripheral edema or cyanosis.   Skin:  Benign.   Neuro:  Nonfocal, alert and  oriented x 3.   Lab Results: Lab Results  Component Value Date   WBC 6.2 06/29/2011   HGB 12.9 06/29/2011   HCT 37.9 06/29/2011   MCV 95.1 06/29/2011   PLT 133* 06/29/2011   NEUTROABS 3.5 06/29/2011     Chemistry      Component Value Date/Time   NA 138 06/29/2011 1448   NA 141 09/02/2010 1134   K 3.4* 06/29/2011 1448   K 4.3 09/02/2010 1134   CL 100 06/29/2011 1448   CL 103 09/02/2010 1134   CO2 30 06/29/2011 1448   CO2 25 09/02/2010 1134   BUN 15 06/29/2011 1448   BUN 11 09/02/2010 1134   CREATININE 1.00  06/29/2011 1448   CREATININE 0.7 09/02/2010 1134      Component Value Date/Time   CALCIUM 10.3 06/29/2011 1448   CALCIUM 8.4 09/02/2010 1134   ALKPHOS 117 06/29/2011 1448   ALKPHOS 84 09/02/2010 1134   AST 69* 06/29/2011 1448   AST 27 09/02/2010 1134   ALT 23 06/29/2011 1448   BILITOT 0.7 06/29/2011 1448   BILITOT 0.60 09/02/2010 1134      Lab Results  Component Value Date   LABCA2 276* 06/23/2011    Radiological Studies: Ct Head W Wo Contrast 06/15/2011  *RADIOLOGY REPORT*  Clinical Data: History of breast cancer.  Evaluate for brain metastases.  CT HEAD WITHOUT AND WITH CONTRAST  Technique:  Contiguous axial images were obtained from the base of the skull through the vertex without and with intravenous contrast.  Contrast:  80 ml of omni 300  Comparison: 10/05/2010  Findings: The brain has a normal appearance without evidence for hemorrhage, infarction, hydrocephalus, or mass lesion.  There is no extra axial fluid collection.  The skull and paranasal sinuses are normal.  IMPRESSION:  1.  Normal appearance of the brain.  No specific features identified to suggest bone metastases.  Original Report Authenticated By: Rosealee Albee, M.D.   Ct Abdomen Pelvis W Contrast 06/15/2011  *RADIOLOGY REPORT*  Clinical Data: Abdominal pain with nausea and vomiting.  Metastatic breast cancer.  CT ABDOMEN AND PELVIS WITH CONTRAST  Technique:  Multidetector CT imaging of the abdomen and pelvis was performed following the standard protocol during bolus administration of intravenous contrast.  Contrast: 80mL OMNIPAQUE IOHEXOL 300 MG/ML IJ SOLN  Comparison: CT scan dated 12/20/2010 and PET CT scan dated 03/20/2011  Findings: The metastatic lesions in the right and left lobes of the liver have progressed in size and number.  The metastases in the left lobe are immediately adjacent to the lesser curvature of the stomach.  The spleen, pancreas, adrenal glands, and kidneys demonstrate no acute abnormalities.  The bowel is  normal.  Uterus and ovaries are normal.  No free air or free fluid.  Sclerotic metastases in the spine and pelvis are unchanged.  No new osseous lesions.  Lung bases are clear.  IMPRESSION: Progressive metastatic disease in the liver.  No other change.  Original Report Authenticated By: Gwynn Burly, M.D.   Dg Abd Portable 1v 06/15/2011  *RADIOLOGY REPORT*  Clinical Data: 41 year old female with abdominal pain nausea and vomiting.  History of breast cancer.  PORTABLE ABDOMEN - 1 VIEW  Technique: Portable supine AP view of the abdomen 1109 hours.  Comparison:   03/24/2011.  Findings: Nonobstructed bowel gas pattern.Abdominal and pelvic visceral contours are within normal limits. Stable visualized osseous structures.    IMPRESSION:  Nonobstructed bowel gas pattern.  Original Report Authenticated By: Ulla Potash  III, M.D.     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 Currently day 6 of cycle 1 of every 3 week Kadcyla. She also receives monthly Faslodex, Zoladex, and Xgeva. 2. CHF: followed closely by Dr. Gala Romney.  Case reviewed with Dr. Pierce Crane.   Plan:  Kathy Jimenez will return in one week's time for CBC alone, then in 2 weeks for followup exam prior to day 1 cycle 2 of Kadcyla.  a CBC, cemented, LDH and CA 27-29 will be obtained with a two-week followup visit. She knows to contact us in the interim if the need should arise, also that she schedule for followup with Dr. Gala Romney on 07/17/2011 with repeat 2-D echocardiogram.   This plan was reviewed with the patient, who voices understanding and agreement.  She knows to call with any changes or problems.    Tramaine Sauls T, PA-C 06/29/2011

## 2011-06-30 ENCOUNTER — Telehealth: Payer: Self-pay | Admitting: *Deleted

## 2011-06-30 ENCOUNTER — Other Ambulatory Visit: Payer: BC Managed Care – PPO | Admitting: Lab

## 2011-06-30 ENCOUNTER — Ambulatory Visit: Payer: BC Managed Care – PPO

## 2011-06-30 NOTE — Telephone Encounter (Signed)
gave patient appointment for lab only 07-03-2011 lab midlevel chemo on 07-14-2011

## 2011-07-06 ENCOUNTER — Ambulatory Visit: Payer: BC Managed Care – PPO | Admitting: Lab

## 2011-07-06 ENCOUNTER — Other Ambulatory Visit: Payer: Self-pay | Admitting: Oncology

## 2011-07-06 DIAGNOSIS — C50919 Malignant neoplasm of unspecified site of unspecified female breast: Secondary | ICD-10-CM

## 2011-07-06 LAB — CBC WITH DIFFERENTIAL/PLATELET
Basophils Absolute: 0.1 10*3/uL (ref 0.0–0.1)
Eosinophils Absolute: 0.3 10*3/uL (ref 0.0–0.5)
HCT: 38.3 % (ref 34.8–46.6)
LYMPH%: 30.8 % (ref 14.0–49.7)
MCV: 92.7 fL (ref 79.5–101.0)
MONO%: 11.7 % (ref 0.0–14.0)
RBC: 4.13 10*6/uL (ref 3.70–5.45)
WBC: 6.4 10*3/uL (ref 3.9–10.3)

## 2011-07-07 ENCOUNTER — Ambulatory Visit: Payer: BC Managed Care – PPO

## 2011-07-07 ENCOUNTER — Other Ambulatory Visit (HOSPITAL_BASED_OUTPATIENT_CLINIC_OR_DEPARTMENT_OTHER): Payer: BC Managed Care – PPO | Admitting: Lab

## 2011-07-14 ENCOUNTER — Other Ambulatory Visit (HOSPITAL_BASED_OUTPATIENT_CLINIC_OR_DEPARTMENT_OTHER): Payer: BC Managed Care – PPO | Admitting: Lab

## 2011-07-14 ENCOUNTER — Ambulatory Visit: Payer: BC Managed Care – PPO

## 2011-07-14 ENCOUNTER — Telehealth: Payer: Self-pay | Admitting: *Deleted

## 2011-07-14 ENCOUNTER — Ambulatory Visit (HOSPITAL_BASED_OUTPATIENT_CLINIC_OR_DEPARTMENT_OTHER): Payer: BC Managed Care – PPO

## 2011-07-14 ENCOUNTER — Ambulatory Visit (HOSPITAL_BASED_OUTPATIENT_CLINIC_OR_DEPARTMENT_OTHER): Payer: BC Managed Care – PPO | Admitting: Physician Assistant

## 2011-07-14 VITALS — BP 131/86 | HR 79 | Temp 98.9°F | Ht 64.0 in | Wt 147.3 lb

## 2011-07-14 DIAGNOSIS — C801 Malignant (primary) neoplasm, unspecified: Secondary | ICD-10-CM

## 2011-07-14 DIAGNOSIS — C50919 Malignant neoplasm of unspecified site of unspecified female breast: Secondary | ICD-10-CM

## 2011-07-14 DIAGNOSIS — C7951 Secondary malignant neoplasm of bone: Secondary | ICD-10-CM

## 2011-07-14 DIAGNOSIS — Z5111 Encounter for antineoplastic chemotherapy: Secondary | ICD-10-CM

## 2011-07-14 DIAGNOSIS — I509 Heart failure, unspecified: Secondary | ICD-10-CM

## 2011-07-14 DIAGNOSIS — C787 Secondary malignant neoplasm of liver and intrahepatic bile duct: Secondary | ICD-10-CM

## 2011-07-14 DIAGNOSIS — Z5112 Encounter for antineoplastic immunotherapy: Secondary | ICD-10-CM

## 2011-07-14 LAB — COMPREHENSIVE METABOLIC PANEL
ALT: 24 U/L (ref 0–35)
AST: 33 U/L (ref 0–37)
Creatinine, Ser: 0.81 mg/dL (ref 0.50–1.10)
Total Bilirubin: 0.6 mg/dL (ref 0.3–1.2)

## 2011-07-14 LAB — CBC WITH DIFFERENTIAL/PLATELET
BASO%: 0.5 % (ref 0.0–2.0)
EOS%: 2.4 % (ref 0.0–7.0)
HCT: 36.9 % (ref 34.8–46.6)
LYMPH%: 26.2 % (ref 14.0–49.7)
MCH: 32.1 pg (ref 25.1–34.0)
MCHC: 35.2 g/dL (ref 31.5–36.0)
NEUT%: 62.6 % (ref 38.4–76.8)
Platelets: 244 10*3/uL (ref 145–400)

## 2011-07-14 LAB — LACTATE DEHYDROGENASE: LDH: 324 U/L — ABNORMAL HIGH (ref 94–250)

## 2011-07-14 MED ORDER — DIPHENHYDRAMINE HCL 25 MG PO CAPS
50.0000 mg | ORAL_CAPSULE | Freq: Once | ORAL | Status: AC
  Administered 2011-07-14: 50 mg via ORAL

## 2011-07-14 MED ORDER — SODIUM CHLORIDE 0.9 % IV SOLN
3.6000 mg/kg | Freq: Once | INTRAVENOUS | Status: AC
  Administered 2011-07-14: 240 mg via INTRAVENOUS
  Filled 2011-07-14: qty 12

## 2011-07-14 MED ORDER — FULVESTRANT 250 MG/5ML IM SOLN
500.0000 mg | Freq: Once | INTRAMUSCULAR | Status: AC
  Administered 2011-07-14: 500 mg via INTRAMUSCULAR
  Filled 2011-07-14: qty 10

## 2011-07-14 MED ORDER — LORAZEPAM 0.5 MG PO TABS: 0.5000 mg | ORAL_TABLET | Freq: Three times a day (TID) | ORAL | Status: AC

## 2011-07-14 MED ORDER — HEPARIN SOD (PORK) LOCK FLUSH 100 UNIT/ML IV SOLN
500.0000 [IU] | Freq: Once | INTRAVENOUS | Status: DC | PRN
  Filled 2011-07-14: qty 5

## 2011-07-14 MED ORDER — SODIUM CHLORIDE 0.9 % IJ SOLN
10.0000 mL | INTRAMUSCULAR | Status: DC | PRN
Start: 2011-07-14 — End: 2011-07-14
  Filled 2011-07-14: qty 10

## 2011-07-14 MED ORDER — GOSERELIN ACETATE 3.6 MG ~~LOC~~ IMPL
3.6000 mg | DRUG_IMPLANT | Freq: Once | SUBCUTANEOUS | Status: AC
  Administered 2011-07-14: 3.6 mg via SUBCUTANEOUS
  Filled 2011-07-14: qty 3.6

## 2011-07-14 MED ORDER — ACETAMINOPHEN 325 MG PO TABS
650.0000 mg | ORAL_TABLET | Freq: Once | ORAL | Status: AC
  Administered 2011-07-14: 650 mg via ORAL

## 2011-07-14 MED ORDER — DENOSUMAB 120 MG/1.7ML ~~LOC~~ SOLN
120.0000 mg | Freq: Once | SUBCUTANEOUS | Status: AC
  Administered 2011-07-14: 120 mg via SUBCUTANEOUS
  Filled 2011-07-14: qty 1.7

## 2011-07-14 NOTE — Progress Notes (Signed)
Hematology and Oncology Follow Up Visit  Kathy Jimenez [id number] 16-Jul-1970 41 y.o. 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 Currently day 1 of cycle 2 of every 3 week Kadcyla. She also receives monthly Faslodex, Zoladex, and Xgeva  Interim History:   Kathy Jimenez is seen today prior to initiating her second cycle of every 3 week Kadcyla. She is also due for her monthly Faslodex, Zoladex, and Xgeva.  She is feeling quite well, denying any fevers, chills, or night sweats. No nausea, emesis, diarrhea, or constipation issues. She's had no jaundice, no shortness of breath or chest pain. Her energy level has improved.  A detailed review of systems is otherwise noncontributory as noted below.  Review of Systems: Constitutional:  no weight loss, fever, night sweats and feels well Eyes: no complaints ENT: no complaints Cardiovascular: no chest pain or dyspnea on exertion Respiratory: no cough, shortness of breath, or wheezing Neurological: no TIA or stroke symptoms Dermatological: negative Gastrointestinal: no abdominal pain, change in bowel habits, or black or bloody stools Genito-Urinary: no dysuria, trouble voiding, or hematuria Hematological and Lymphatic: negative Breast: negative Musculoskeletal: negative Remaining ROS negative.   Medications:   I have reviewed the patient's current medications.  Current Outpatient Prescriptions  Medication Sig Dispense Refill  . carvedilol (COREG) 12.5 MG tablet Take 12.5 mg by mouth 2 (two) times daily with a meal.      . digoxin (LANOXIN) 0.125 MG tablet Take 1 tablet (0.125 mg total) by mouth daily.  30 tablet  6  . lisinopril (PRINIVIL,ZESTRIL) 10 MG tablet Take 1 tablet (10 mg total) by mouth 2 (two) times daily.  60 tablet  6  . ondansetron (ZOFRAN) 8 MG tablet Take 8 mg by mouth every 8 (eight) hours as needed.      .  promethazine (PHENERGAN) 6.25 MG/5ML syrup Take 10 mLs (12.5 mg total) by mouth 4 (four) times daily as needed.  120 mL  2  . SODIUM CITRATE PO Take 1 tablet by mouth 2 (two) times daily.       Marland Kitchen DISCONTD: furosemide (LASIX) 20 MG tablet Take 1 tablet (20 mg total) by mouth daily as needed (If weight increases 3 pounds in 24 hours).  30 tablet  6  . DISCONTD: spironolactone (ALDACTONE) 25 MG tablet Take 1 tablet (25 mg total) by mouth daily.  30 tablet  6    Allergies:  Allergies  Allergen Reactions  . Sulfa Antibiotics Hives    Physical Exam: Filed Vitals:   07/14/11 1508  BP: 131/86  Pulse: 79  Temp: 98.9 F (37.2 C)    Body mass index is 25.28 kg/(m^2). Weight: 147 lbs. HEENT:  Sclerae anicteric, conjunctivae pink.  Oropharynx clear.  No mucositis or candidiasis.   Nodes:  No cervical, supraclavicular, or axillary lymphadenopathy palpated.   Lungs:  Clear to auscultation bilaterally.  No crackles, rhonchi, or wheezes.   Heart:  Regular rate and rhythm.   Abdomen:  Abdominal exam was performed by Dr. Donnie Coffin, her prior noted hepatomegaly has improved significantly. Normal bowel sounds are present. No evidence of distention. Musculoskeletal:  No focal spinal tenderness to palpation.  Extremities:  Benign.  No peripheral edema or cyanosis.   Skin:  Benign.   Neuro:  Nonfocal, alert and oriented x 3.   Lab Results: Lab Results  Component Value Date   WBC 8.6 07/14/2011   HGB 13.0  07/14/2011   HCT 36.9 07/14/2011   MCV 91.1 07/14/2011   PLT 244 07/14/2011   NEUTROABS 5.4 07/14/2011     Chemistry      Component Value Date/Time   NA 139 07/14/2011 1448   NA 141 09/02/2010 1134   K 3.5 07/14/2011 1448   K 4.3 09/02/2010 1134   CL 103 07/14/2011 1448   CL 103 09/02/2010 1134   CO2 29 07/14/2011 1448   CO2 25 09/02/2010 1134   BUN 13 07/14/2011 1448   BUN 11 09/02/2010 1134   CREATININE 0.81 07/14/2011 1448   CREATININE 0.7 09/02/2010 1134      Component Value Date/Time   CALCIUM 9.7 07/14/2011  1448   CALCIUM 8.4 09/02/2010 1134   ALKPHOS 84 07/14/2011 1448   ALKPHOS 84 09/02/2010 1134   AST 33 07/14/2011 1448   AST 27 09/02/2010 1134   ALT 24 07/14/2011 1448   BILITOT 0.6 07/14/2011 1448   BILITOT 0.60 09/02/2010 1134      Lab Results  Component Value Date   LABCA2 276* 06/23/2011    Radiological Studies: Ct Head W Wo Contrast 06/15/2011  *RADIOLOGY REPORT*  Clinical Data: History of breast cancer.  Evaluate for brain metastases.  CT HEAD WITHOUT AND WITH CONTRAST  Technique:  Contiguous axial images were obtained from the base of the skull through the vertex without and with intravenous contrast.  Contrast:  80 ml of omni 300  Comparison: 10/05/2010  Findings: The brain has a normal appearance without evidence for hemorrhage, infarction, hydrocephalus, or mass lesion.  There is no extra axial fluid collection.  The skull and paranasal sinuses are normal.  IMPRESSION:  1.  Normal appearance of the brain.  No specific features identified to suggest bone metastases.  Original Report Authenticated By: Rosealee Albee, M.D.   Ct Abdomen Pelvis W Contrast 06/15/2011  *RADIOLOGY REPORT*  Clinical Data: Abdominal pain with nausea and vomiting.  Metastatic breast cancer.  CT ABDOMEN AND PELVIS WITH CONTRAST  Technique:  Multidetector CT imaging of the abdomen and pelvis was performed following the standard protocol during bolus administration of intravenous contrast.  Contrast: 80mL OMNIPAQUE IOHEXOL 300 MG/ML IJ SOLN  Comparison: CT scan dated 12/20/2010 and PET CT scan dated 03/20/2011  Findings: The metastatic lesions in the right and left lobes of the liver have progressed in size and number.  The metastases in the left lobe are immediately adjacent to the lesser curvature of the stomach.  The spleen, pancreas, adrenal glands, and kidneys demonstrate no acute abnormalities.  The bowel is normal.  Uterus and ovaries are normal.  No free air or free fluid.  Sclerotic metastases in the spine and pelvis are  unchanged.  No new osseous lesions.  Lung bases are clear.  IMPRESSION: Progressive metastatic disease in the liver.  No other change.  Original Report Authenticated By: Gwynn Burly, M.D.   Dg Abd Portable 1v 06/15/2011  *RADIOLOGY REPORT*  Clinical Data: 41 year old female with abdominal pain nausea and vomiting.  History of breast cancer.  PORTABLE ABDOMEN - 1 VIEW  Technique: Portable supine AP view of the abdomen 1109 hours.  Comparison:   03/24/2011.  Findings: Nonobstructed bowel gas pattern.Abdominal and pelvic visceral contours are within normal limits. Stable visualized osseous structures.    IMPRESSION:  Nonobstructed bowel gas pattern.  Original Report Authenticated By: Harley Hallmark, M.D.     Assessment:  Kathy Jimenez is a 41 year old [place] woman with a metastatic ER/HER-2 positive breast  carcinoma with known bone and liver involvement. Currently day 1 of cycle 2 of every 3 week Kadcyla. She also receives monthly Faslodex, Zoladex, and Xgeva, for which she is due today. 2. CHF: followed closely by Dr. Gala Romney.  Case reviewed with Dr. Pierce Crane.   Plan:  Kathy Jimenez will receive her second cycle of Kadcyla today as scheduled. She will also receive her monthly Faslodex, Zoladex, and Xgeva.  prescription for lorazepam 0.5 mg was given as a refill. We will officially see her back in 3 weeks time prior to day 1 cycle 3 of Kadcyla, though she knows not to hesitate to contact us prior if the need should arise.   This plan was reviewed with the patient, who voices understanding and agreement.  She knows to call with any changes or problems.    Milad Bublitz T, PA-C 07/14/2011

## 2011-07-14 NOTE — Telephone Encounter (Signed)
called patient assist Boyd Kerbs she confirmed patients 07-2011 08-2011 09-2011 printed out calendar and gave to the patient in the chem room

## 2011-07-17 ENCOUNTER — Ambulatory Visit (HOSPITAL_COMMUNITY)
Admission: RE | Admit: 2011-07-17 | Discharge: 2011-07-17 | Disposition: A | Discharge status: Home / Self Care | Payer: BC Managed Care – PPO | Source: Ambulatory Visit | Attending: Internal Medicine | Admitting: Internal Medicine

## 2011-07-17 VITALS — BP 102/64 | HR 74 | Wt 143.5 lb

## 2011-07-17 DIAGNOSIS — I5022 Chronic systolic (congestive) heart failure: Secondary | ICD-10-CM | POA: Insufficient documentation

## 2011-07-17 NOTE — Progress Notes (Signed)
HPI:  Kathy Jimenez is a 41 y.o. female with L breast CA in 2004 s/p lumpectomy and treatment with FEC (flourouracil, epirubicin, cyclophosphamide). Also treated with hormonal therapy. 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  Started on weekly herceptin. Recently also started on Perjeta and Tykerb. Dr. Donnie Coffin evaluated her for a cough and progressive DOE; CT which showed dilated heart with pleural effusion and echo with EF 25% with global HK. Mod MR/TR, lateral s' 6.1 sec/m. Admitted to the hospital for volume overload and tachycardia.  Diuresed 9 lbs, discharge weight 151 lbs.  With history of severe kidney stones, lasix 20 mg given prn.    03/23/11: Echo EF 25-30%, lateral s' 6.4 sec/m, LVIDd improved from 4.9 to 3.4.  05/24/11: Echo: EF 45-50%, diffuse hypokinesis 06/16/11:  Echo: EF 60-65%  She is here for routine follow up today.  She feels good.  Restarted TDM1/herceptin q3 weeks and has had 2 treatments.  Tolerating it well, but has a fever 8-10 hours afterwards.  She denies SOB/orthopnea/PND.  No palpitaitons.  Has not taken any lasix.  Continues to work full time.     ROS: All systems negative except as listed in HPI, PMH and Problem List.  Past Medical History  Diagnosis Date  . 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 Kidney stones     recurrent since 1989  . Endometrial polyp   . PONV (postoperative nausea and vomiting)   . CHF (congestive heart failure)     from chemo treatment    Current Outpatient Prescriptions  Medication Sig Dispense Refill  . carvedilol (COREG) 12.5 MG tablet Take 12.5 mg by mouth 2 (two) times daily with a meal.      . digoxin (LANOXIN) 0.125 MG tablet Take 1 tablet (0.125 mg total) by mouth daily.  30 tablet  6  . lisinopril (PRINIVIL,ZESTRIL) 10 MG tablet Take 1 tablet (10 mg total) by mouth 2 (two) times daily.  60 tablet  6  .  LORazepam (ATIVAN) 0.5 MG tablet Take 1 tablet (0.5 mg total) by mouth every 8 (eight) hours. 1-2 po q6-8h prn anxiety/nausea/sleep  90 tablet  0  . ondansetron (ZOFRAN) 8 MG tablet Take 8 mg by mouth every 8 (eight) hours as needed.      . promethazine (PHENERGAN) 6.25 MG/5ML syrup Take 10 mLs (12.5 mg total) by mouth 4 (four) times daily as needed.  120 mL  2  . SODIUM CITRATE PO Take 1 tablet by mouth 2 (two) times daily.       Marland Kitchen DISCONTD: furosemide (LASIX) 20 MG tablet Take 1 tablet (20 mg total) by mouth daily as needed (If weight increases 3 pounds in 24 hours).  30 tablet  6  . DISCONTD: spironolactone (ALDACTONE) 25 MG tablet Take 1 tablet (25 mg total) by mouth daily.  30 tablet  6     PHYSICAL EXAM: Filed Vitals:   07/17/11 0854  BP: 102/64  Pulse: 74  Weight: 143 lb 8 oz (65.091 kg)  SpO2: 100%   General:  Well appearing.  No resp difficulty HEENT: normal Neck: supple. JVP flat. Carotids 2+ bilaterally; no bruits. No lymphadenopathy or thryomegaly appreciated. Cor: PMI normal. Regular rate & rhythm. No S3.  Trivial TR murmur.   Lungs: clear Abdomen: soft. Mildly tender diffusely, nondistended. No hepatosplenomegaly. No bruits or masses. Very hypoactive bowel  sounds. Extremities: no cyanosis, clubbing, rash, edema Neuro: alert & orientedx3, cranial nerves grossly intact. Moves all 4 extremities w/o difficulty. Affect pleasant.    ASSESSMENT & PLAN:

## 2011-07-17 NOTE — Assessment & Plan Note (Addendum)
Volume status looks good.  EF recovered by echo 06/2011.  Has restarted TDM1/herceptin therapy and undergone 2 treatments.  Will recheck echo after third dose.  Continue close serial echos.    Patient seen and examined with Ulyess Blossom PA-C. We discussed all aspects of the encounter. I agree with the assessment and plan as stated above.  Tolerating Herceptin well. Continue therapy. Volume status good on exam.

## 2011-07-17 NOTE — Patient Instructions (Signed)
Follow up with echo in 1 month.

## 2011-08-03 ENCOUNTER — Other Ambulatory Visit: Payer: Self-pay | Admitting: Physician Assistant

## 2011-08-03 DIAGNOSIS — C787 Secondary malignant neoplasm of liver and intrahepatic bile duct: Secondary | ICD-10-CM

## 2011-08-04 ENCOUNTER — Ambulatory Visit (HOSPITAL_BASED_OUTPATIENT_CLINIC_OR_DEPARTMENT_OTHER): Payer: BC Managed Care – PPO

## 2011-08-04 ENCOUNTER — Encounter: Payer: Self-pay | Admitting: Physician Assistant

## 2011-08-04 ENCOUNTER — Other Ambulatory Visit (HOSPITAL_BASED_OUTPATIENT_CLINIC_OR_DEPARTMENT_OTHER): Payer: BC Managed Care – PPO | Admitting: Lab

## 2011-08-04 ENCOUNTER — Ambulatory Visit: Payer: BC Managed Care – PPO

## 2011-08-04 ENCOUNTER — Ambulatory Visit (HOSPITAL_BASED_OUTPATIENT_CLINIC_OR_DEPARTMENT_OTHER): Payer: BC Managed Care – PPO | Admitting: Physician Assistant

## 2011-08-04 VITALS — BP 125/87 | HR 84 | Temp 98.0°F | Ht 64.0 in | Wt 146.1 lb

## 2011-08-04 DIAGNOSIS — N200 Calculus of kidney: Secondary | ICD-10-CM

## 2011-08-04 DIAGNOSIS — C7951 Secondary malignant neoplasm of bone: Secondary | ICD-10-CM

## 2011-08-04 DIAGNOSIS — C787 Secondary malignant neoplasm of liver and intrahepatic bile duct: Secondary | ICD-10-CM

## 2011-08-04 DIAGNOSIS — Z5111 Encounter for antineoplastic chemotherapy: Secondary | ICD-10-CM

## 2011-08-04 DIAGNOSIS — C50919 Malignant neoplasm of unspecified site of unspecified female breast: Secondary | ICD-10-CM

## 2011-08-04 DIAGNOSIS — C801 Malignant (primary) neoplasm, unspecified: Secondary | ICD-10-CM

## 2011-08-04 DIAGNOSIS — R300 Dysuria: Secondary | ICD-10-CM

## 2011-08-04 DIAGNOSIS — Z5112 Encounter for antineoplastic immunotherapy: Secondary | ICD-10-CM

## 2011-08-04 DIAGNOSIS — C7952 Secondary malignant neoplasm of bone marrow: Secondary | ICD-10-CM

## 2011-08-04 LAB — COMPREHENSIVE METABOLIC PANEL
Albumin: 4.5 g/dL (ref 3.5–5.2)
CO2: 29 mEq/L (ref 19–32)
Glucose, Bld: 90 mg/dL (ref 70–99)
Potassium: 4.3 mEq/L (ref 3.5–5.3)
Sodium: 141 mEq/L (ref 135–145)
Total Bilirubin: 0.5 mg/dL (ref 0.3–1.2)
Total Protein: 7.3 g/dL (ref 6.0–8.3)

## 2011-08-04 LAB — CBC WITH DIFFERENTIAL/PLATELET
Basophils Absolute: 0.1 10*3/uL (ref 0.0–0.1)
Eosinophils Absolute: 0.3 10*3/uL (ref 0.0–0.5)
HCT: 38.2 % (ref 34.8–46.6)
HGB: 13.2 g/dL (ref 11.6–15.9)
MCV: 94 fL (ref 79.5–101.0)
MONO%: 8.9 % (ref 0.0–14.0)
NEUT#: 4.8 10*3/uL (ref 1.5–6.5)
RDW: 12.7 % (ref 11.2–14.5)

## 2011-08-04 LAB — URINALYSIS, MICROSCOPIC - CHCC
Glucose: NEGATIVE g/dL
Nitrite: NEGATIVE
RBC count: NEGATIVE (ref 0–2)

## 2011-08-04 LAB — CANCER ANTIGEN 27.29: CA 27.29: 68 U/mL — ABNORMAL HIGH (ref 0–39)

## 2011-08-04 LAB — LACTATE DEHYDROGENASE: LDH: 239 U/L (ref 94–250)

## 2011-08-04 MED ORDER — DENOSUMAB 120 MG/1.7ML ~~LOC~~ SOLN
120.0000 mg | Freq: Once | SUBCUTANEOUS | Status: AC
  Administered 2011-08-04: 120 mg via SUBCUTANEOUS
  Filled 2011-08-04: qty 1.7

## 2011-08-04 MED ORDER — GOSERELIN ACETATE 3.6 MG ~~LOC~~ IMPL
3.6000 mg | DRUG_IMPLANT | Freq: Once | SUBCUTANEOUS | Status: AC
  Administered 2011-08-04: 3.6 mg via SUBCUTANEOUS
  Filled 2011-08-04: qty 3.6

## 2011-08-04 MED ORDER — SODIUM CHLORIDE 0.9 % IV SOLN
3.6000 mg/kg | Freq: Once | INTRAVENOUS | Status: AC
  Administered 2011-08-04: 240 mg via INTRAVENOUS
  Filled 2011-08-04: qty 12

## 2011-08-04 MED ORDER — SODIUM CHLORIDE 0.9 % IJ SOLN
10.0000 mL | INTRAMUSCULAR | Status: DC | PRN
  Administered 2011-08-04: 10 mL
  Filled 2011-08-04: qty 10

## 2011-08-04 MED ORDER — HEPARIN SOD (PORK) LOCK FLUSH 100 UNIT/ML IV SOLN
500.0000 [IU] | Freq: Once | INTRAVENOUS | Status: AC | PRN
  Administered 2011-08-04: 500 [IU]
  Filled 2011-08-04: qty 5

## 2011-08-04 MED ORDER — ACETAMINOPHEN 325 MG PO TABS
650.0000 mg | ORAL_TABLET | Freq: Once | ORAL | Status: AC
  Administered 2011-08-04: 650 mg via ORAL

## 2011-08-04 MED ORDER — DIPHENHYDRAMINE HCL 25 MG PO CAPS: 50.0000 mg | ORAL_CAPSULE | Freq: Once | ORAL | Status: DC

## 2011-08-04 MED ORDER — FULVESTRANT 250 MG/5ML IM SOLN
500.0000 mg | Freq: Once | INTRAMUSCULAR | Status: AC
  Administered 2011-08-04: 500 mg via INTRAMUSCULAR
  Filled 2011-08-04: qty 10

## 2011-08-04 MED ORDER — SODIUM CHLORIDE 0.9 % IV SOLN
Freq: Once | INTRAVENOUS | Status: AC
  Administered 2011-08-04: 15:00:00 via INTRAVENOUS

## 2011-08-04 NOTE — Progress Notes (Signed)
Hematology and Oncology Follow Up Visit  Kathy Jimenez [id number] Mar 29, 1971 41 y.o. 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 Currently day 1 of cycle 3 of every 3 week Kadcyla. She also receives monthly Faslodex, Zoladex, and Xgeva  Interim History:   Kathy Jimenez is seen today prior to initiating her third cycle of every 3 week Kadcyla. She is also due for her monthly Faslodex, Zoladex, and Xgeva.  She is feeling quite well, denying any fevers, chills, or night sweats. No nausea, emesis, diarrhea, or constipation issues. She's had no jaundice, no shortness of breath or chest pain. Her energy level has improved.  A detailed review of systems is otherwise noncontributory as noted below.  Review of Systems: Constitutional:  no weight loss, fever, night sweats and feels well Eyes: no complaints ENT: no complaints Cardiovascular: no chest pain or dyspnea on exertion Respiratory: no cough, shortness of breath, or wheezing Neurological: no TIA or stroke symptoms Dermatological: negative Gastrointestinal: no abdominal pain, change in bowel habits, or black or bloody stools Genito-Urinary: no dysuria, trouble voiding, or hematuria Hematological and Lymphatic: negative Breast: negative Musculoskeletal: negative Remaining ROS negative.   Medications:   I have reviewed the patient's current medications.  Current Outpatient Prescriptions  Medication Sig Dispense Refill  . carvedilol (COREG) 12.5 MG tablet Take 12.5 mg by mouth 2 (two) times daily with a meal.      . digoxin (LANOXIN) 0.125 MG tablet Take 1 tablet (0.125 mg total) by mouth daily.  30 tablet  6  . lisinopril (PRINIVIL,ZESTRIL) 10 MG tablet Take 1 tablet (10 mg total) by mouth 2 (two) times daily.  60 tablet  6  . SODIUM CITRATE PO Take 1 tablet by mouth 2 (two) times daily.       . ondansetron (ZOFRAN) 8  MG tablet Take 8 mg by mouth every 8 (eight) hours as needed.      . promethazine (PHENERGAN) 6.25 MG/5ML syrup Take 10 mLs (12.5 mg total) by mouth 4 (four) times daily as needed.  120 mL  2  . DISCONTD: furosemide (LASIX) 20 MG tablet Take 1 tablet (20 mg total) by mouth daily as needed (If weight increases 3 pounds in 24 hours).  30 tablet  6  . DISCONTD: spironolactone (ALDACTONE) 25 MG tablet Take 1 tablet (25 mg total) by mouth daily.  30 tablet  6    Allergies:  Allergies  Allergen Reactions  . Sulfa Antibiotics Hives    Physical Exam: Filed Vitals:   08/04/11 1416  BP: 125/87  Pulse: 84  Temp: 98 F (36.7 C)    Body mass index is 25.08 kg/(m^2). Weight: 146 lbs. HEENT:  Sclerae anicteric, conjunctivae pink.  Oropharynx clear.  No mucositis or candidiasis.   Nodes:  No cervical, supraclavicular, or axillary lymphadenopathy palpated.   Lungs:  Clear to auscultation bilaterally.  No crackles, rhonchi, or wheezes.   Heart:  Regular rate and rhythm.   Abdomen:  Abdominal exam was performed by Dr. Donnie Coffin, her prior noted hepatomegaly has improved significantly. Normal bowel sounds are present. No evidence of distention. Musculoskeletal:  No focal spinal tenderness to palpation.  Extremities:  Benign.  No peripheral edema or cyanosis.   Skin:  Benign.   Neuro:  Nonfocal, alert and oriented x 3.   Lab Results: Lab Results  Component Value Date   WBC 8.3 08/04/2011   HGB 13.2  08/04/2011   HCT 38.2 08/04/2011   MCV 94.0 08/04/2011   PLT 258 08/04/2011   NEUTROABS 4.8 08/04/2011     Chemistry      Component Value Date/Time   NA 139 07/14/2011 1448   NA 141 09/02/2010 1134   K 3.5 07/14/2011 1448   K 4.3 09/02/2010 1134   CL 103 07/14/2011 1448   CL 103 09/02/2010 1134   CO2 29 07/14/2011 1448   CO2 25 09/02/2010 1134   BUN 13 07/14/2011 1448   BUN 11 09/02/2010 1134   CREATININE 0.81 07/14/2011 1448   CREATININE 0.7 09/02/2010 1134      Component Value Date/Time   CALCIUM 9.7 07/14/2011  1448   CALCIUM 8.4 09/02/2010 1134   ALKPHOS 84 07/14/2011 1448   ALKPHOS 84 09/02/2010 1134   AST 33 07/14/2011 1448   AST 27 09/02/2010 1134   ALT 24 07/14/2011 1448   BILITOT 0.6 07/14/2011 1448   BILITOT 0.60 09/02/2010 1134      Lab Results  Component Value Date   LABCA2 165* 07/14/2011    Radiological Studies: Ct Head W Wo Contrast 06/15/2011  *RADIOLOGY REPORT*  Clinical Data: History of breast cancer.  Evaluate for brain metastases.  CT HEAD WITHOUT AND WITH CONTRAST  Technique:  Contiguous axial images were obtained from the base of the skull through the vertex without and with intravenous contrast.  Contrast:  80 ml of omni 300  Comparison: 10/05/2010  Findings: The brain has a normal appearance without evidence for hemorrhage, infarction, hydrocephalus, or mass lesion.  There is no extra axial fluid collection.  The skull and paranasal sinuses are normal.  IMPRESSION:  1.  Normal appearance of the brain.  No specific features identified to suggest bone metastases.  Original Report Authenticated By: Rosealee Albee, M.D.   Ct Abdomen Pelvis W Contrast 06/15/2011  *RADIOLOGY REPORT*  Clinical Data: Abdominal pain with nausea and vomiting.  Metastatic breast cancer.  CT ABDOMEN AND PELVIS WITH CONTRAST  Technique:  Multidetector CT imaging of the abdomen and pelvis was performed following the standard protocol during bolus administration of intravenous contrast.  Contrast: 80mL OMNIPAQUE IOHEXOL 300 MG/ML IJ SOLN  Comparison: CT scan dated 12/20/2010 and PET CT scan dated 03/20/2011  Findings: The metastatic lesions in the right and left lobes of the liver have progressed in size and number.  The metastases in the left lobe are immediately adjacent to the lesser curvature of the stomach.  The spleen, pancreas, adrenal glands, and kidneys demonstrate no acute abnormalities.  The bowel is normal.  Uterus and ovaries are normal.  No free air or free fluid.  Sclerotic metastases in the spine and pelvis are  unchanged.  No new osseous lesions.  Lung bases are clear.  IMPRESSION: Progressive metastatic disease in the liver.  No other change.  Original Report Authenticated By: Gwynn Burly, M.D.   Dg Abd Portable 1v 06/15/2011  *RADIOLOGY REPORT*  Clinical Data: 41 year old female with abdominal pain nausea and vomiting.  History of breast cancer.  PORTABLE ABDOMEN - 1 VIEW  Technique: Portable supine AP view of the abdomen 1109 hours.  Comparison:   03/24/2011.  Findings: Nonobstructed bowel gas pattern.Abdominal and pelvic visceral contours are within normal limits. Stable visualized osseous structures.    IMPRESSION:  Nonobstructed bowel gas pattern.  Original Report Authenticated By: Harley Hallmark, M.D.     Assessment:  Kathy Jimenez is a 41 year old [place] woman with a metastatic ER/HER-2 positive breast  carcinoma with known bone and liver involvement. Currently day 1 of cycle 3 of every 3 week Kadcyla. She also receives monthly Faslodex, Zoladex, and Xgeva, for which she is due in 1 weeks time 2. CHF: followed closely by Dr. Gala Romney. 3. Slight discomfort in mid-back region.  Case reviewed with Dr. Pierce Crane.   Plan:  Kathy Jimenez will receive her third cycle of Kadcyla today as scheduled. She will submit a urine for urinalysis. Of note, she has history of recurrent renal calculi. We will officially see her back in 3 weeks time prior to day 1 cycle 4 of Kadcyla, though she knows not to hesitate to contact us prior if the need should arise.   This plan was reviewed with the patient, who voices understanding and agreement.  She knows to call with any changes or problems.    Amarion Portell T, PA-C 08/04/2011

## 2011-08-07 ENCOUNTER — Encounter: Payer: Self-pay | Admitting: Oncology

## 2011-08-17 ENCOUNTER — Encounter (HOSPITAL_COMMUNITY): Payer: Self-pay

## 2011-08-17 ENCOUNTER — Encounter (HOSPITAL_COMMUNITY): Payer: Self-pay | Admitting: *Deleted

## 2011-08-17 ENCOUNTER — Ambulatory Visit (HOSPITAL_COMMUNITY)
Admission: RE | Admit: 2011-08-17 | Discharge: 2011-08-17 | Disposition: A | Discharge status: Home / Self Care | Payer: BC Managed Care – PPO | Source: Ambulatory Visit | Attending: Internal Medicine | Admitting: Internal Medicine

## 2011-08-17 ENCOUNTER — Ambulatory Visit (HOSPITAL_COMMUNITY)
Admission: RE | Admit: 2011-08-17 | Discharge: 2011-08-17 | Disposition: A | Discharge status: Home / Self Care | Payer: BC Managed Care – PPO | Source: Ambulatory Visit | Attending: Physician Assistant | Admitting: Physician Assistant

## 2011-08-17 VITALS — BP 116/74 | HR 69 | Resp 18 | Ht 64.0 in | Wt 144.1 lb

## 2011-08-17 VITALS — Ht 64.0 in | Wt 142.2 lb

## 2011-08-17 DIAGNOSIS — I5022 Chronic systolic (congestive) heart failure: Secondary | ICD-10-CM

## 2011-08-17 DIAGNOSIS — C50919 Malignant neoplasm of unspecified site of unspecified female breast: Secondary | ICD-10-CM | POA: Insufficient documentation

## 2011-08-17 DIAGNOSIS — I519 Heart disease, unspecified: Secondary | ICD-10-CM

## 2011-08-17 DIAGNOSIS — C787 Secondary malignant neoplasm of liver and intrahepatic bile duct: Secondary | ICD-10-CM | POA: Insufficient documentation

## 2011-08-17 DIAGNOSIS — I509 Heart failure, unspecified: Secondary | ICD-10-CM | POA: Insufficient documentation

## 2011-08-17 MED ORDER — PERFLUTREN LIPID MICROSPHERE 6.52 MG/ML IV SUSP
10.0000 uL/kg | Freq: Once | INTRAVENOUS | Status: AC
  Administered 2011-08-17: 0.715 mg via INTRAVENOUS
  Filled 2011-08-17: qty 2

## 2011-08-17 NOTE — Procedures (Signed)
[phone] 2 cc IV Definity given  Denies discomfort or back pain 0945 1 cc IV Definity given  Denies discomfort or back pain Iv removed

## 2011-08-17 NOTE — Procedures (Signed)
150 cc NS given

## 2011-08-17 NOTE — OR Nursing (Signed)
IV 22 gauge started Rt wrist.  250cc bag [place] started

## 2011-08-17 NOTE — Progress Notes (Signed)
  Echocardiogram 2D Echocardiogram has been performed.  Dorena Cookey 08/17/2011, 11:32 AM

## 2011-08-19 NOTE — Progress Notes (Signed)
HPI:  Kathy Jimenez is a 41 y.o. female with L breast CA in 2004 s/p lumpectomy and treatment with FEC (flourouracil, epirubicin, cyclophosphamide). Also treated with hormonal therapy. 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  Started on weekly herceptin. Recently also started on Perjeta and Tykerb. Dr. Donnie Coffin evaluated her for a cough and progressive DOE; CT which showed dilated heart with pleural effusion and echo with EF 25% with global HK. Mod MR/TR, lateral s' 6.1 sec/m. Admitted to the hospital for volume overload and tachycardia.  Diuresed 9 lbs, discharge weight 151 lbs.  With history of severe kidney stones, lasix 20 mg given prn.    03/23/11: Echo EF 25-30%, lateral s' 6.4 sec/m, LVIDd improved from 4.9 to 3.4.  05/24/11: Echo: EF 45-50%, diffuse hypokinesis 06/16/11:  Echo: EF 60-65% 08/17/11; Echo EF 50%   She is here for routine follow up today.  She feels great.  Continues withTDM1/herceptin q3 weeks. Tumor markers now essentially down to zero.  She denies SOB/orthopnea/PND.  No palpitaitons.  Has not taken any lasix.  Continues to work full time.     ROS: All systems negative except as listed in HPI, PMH and Problem List.  Past Medical History  Diagnosis Date  . 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 Kidney stones     recurrent since 1989  . Endometrial polyp   . PONV (postoperative nausea and vomiting)   . CHF (congestive heart failure)     from chemo treatment    Current Outpatient Prescriptions  Medication Sig Dispense Refill  . carvedilol (COREG) 12.5 MG tablet Take 12.5 mg by mouth 2 (two) times daily with a meal.      . digoxin (LANOXIN) 0.125 MG tablet Take 1 tablet (0.125 mg total) by mouth daily.  30 tablet  6  . lisinopril (PRINIVIL,ZESTRIL) 10 MG tablet Take 1 tablet (10 mg total) by mouth 2 (two) times daily.  60 tablet  6  . SODIUM CITRATE  PO Take 1 tablet by mouth 2 (two) times daily.       Marland Kitchen DISCONTD: furosemide (LASIX) 20 MG tablet Take 1 tablet (20 mg total) by mouth daily as needed (If weight increases 3 pounds in 24 hours).  30 tablet  6  . DISCONTD: spironolactone (ALDACTONE) 25 MG tablet Take 1 tablet (25 mg total) by mouth daily.  30 tablet  6     PHYSICAL EXAM: Filed Vitals:   08/17/11 1051  BP: 116/74  Pulse: 69  Resp: 18  Height: 5\' 4"  (1.626 m)  Weight: 144 lb 1.9 oz (65.372 kg)   General:  Well appearing.  No resp difficulty HEENT: normal Neck: supple. JVP flat. Carotids 2+ bilaterally; no bruits. No lymphadenopathy or thryomegaly appreciated. Cor: PMI normal. Regular rate & rhythm. No S3.  Trivial TR murmur.   Lungs: clear Abdomen: soft. Mildly tender diffusely, nondistended. No hepatosplenomegaly. No bruits or masses. Very hypoactive bowel sounds. Extremities: no cyanosis, clubbing, rash, edema Neuro: alert & orientedx3, cranial nerves grossly intact. Moves all 4 extremities w/o difficulty. Affect pleasant.    ASSESSMENT & PLAN:

## 2011-08-19 NOTE — Assessment & Plan Note (Signed)
She is doing great clinically with tremendous response to TDM1. We reviewed her echo together. EF seems to be down a bit from most recent but still in the low normal range ~50%. LV size still small and not re-dilating. Doppler parameters stable. We will let her continue chemo at this point but need to follow very closely. Continue ace-i and b-blocker. Check echo 2 months.

## 2011-08-25 ENCOUNTER — Ambulatory Visit (HOSPITAL_BASED_OUTPATIENT_CLINIC_OR_DEPARTMENT_OTHER): Payer: BC Managed Care – PPO | Admitting: Physician Assistant

## 2011-08-25 ENCOUNTER — Ambulatory Visit: Payer: BC Managed Care – PPO

## 2011-08-25 ENCOUNTER — Other Ambulatory Visit: Payer: Self-pay | Admitting: *Deleted

## 2011-08-25 ENCOUNTER — Other Ambulatory Visit (HOSPITAL_BASED_OUTPATIENT_CLINIC_OR_DEPARTMENT_OTHER): Payer: BC Managed Care – PPO | Admitting: Lab

## 2011-08-25 VITALS — BP 112/73 | HR 87 | Temp 98.3°F | Ht 64.0 in | Wt 144.3 lb

## 2011-08-25 DIAGNOSIS — C50919 Malignant neoplasm of unspecified site of unspecified female breast: Secondary | ICD-10-CM

## 2011-08-25 DIAGNOSIS — C787 Secondary malignant neoplasm of liver and intrahepatic bile duct: Secondary | ICD-10-CM

## 2011-08-25 DIAGNOSIS — Z5112 Encounter for antineoplastic immunotherapy: Secondary | ICD-10-CM

## 2011-08-25 DIAGNOSIS — C801 Malignant (primary) neoplasm, unspecified: Secondary | ICD-10-CM

## 2011-08-25 DIAGNOSIS — C7951 Secondary malignant neoplasm of bone: Secondary | ICD-10-CM

## 2011-08-25 LAB — CBC WITH DIFFERENTIAL/PLATELET
Eosinophils Absolute: 0.2 10*3/uL (ref 0.0–0.5)
MONO#: 0.7 10*3/uL (ref 0.1–0.9)
NEUT#: 4.3 10*3/uL (ref 1.5–6.5)
Platelets: 246 10*3/uL (ref 145–400)
RBC: 4.04 10*6/uL (ref 3.70–5.45)
RDW: 12.8 % (ref 11.2–14.5)
WBC: 7.7 10*3/uL (ref 3.9–10.3)
nRBC: 0 % (ref 0–0)

## 2011-08-25 LAB — COMPREHENSIVE METABOLIC PANEL
ALT: 16 U/L (ref 0–35)
CO2: 29 mEq/L (ref 19–32)
Calcium: 9.7 mg/dL (ref 8.4–10.5)
Chloride: 103 mEq/L (ref 96–112)
Potassium: 3.8 mEq/L (ref 3.5–5.3)
Sodium: 140 mEq/L (ref 135–145)
Total Protein: 7 g/dL (ref 6.0–8.3)

## 2011-08-25 LAB — CANCER ANTIGEN 27.29: CA 27.29: 49 U/mL — ABNORMAL HIGH (ref 0–39)

## 2011-08-25 MED ORDER — DIPHENHYDRAMINE HCL 25 MG PO CAPS: 50.0000 mg | ORAL_CAPSULE | Freq: Once | ORAL | Status: DC

## 2011-08-25 MED ORDER — ACETAMINOPHEN 325 MG PO TABS
650.0000 mg | ORAL_TABLET | Freq: Once | ORAL | Status: AC
  Administered 2011-08-25: 650 mg via ORAL

## 2011-08-25 MED ORDER — SODIUM CHLORIDE 0.9 % IJ SOLN
10.0000 mL | INTRAMUSCULAR | Status: DC | PRN
  Filled 2011-08-25: qty 10

## 2011-08-25 MED ORDER — HEPARIN SOD (PORK) LOCK FLUSH 100 UNIT/ML IV SOLN
500.0000 [IU] | Freq: Once | INTRAVENOUS | Status: DC | PRN
  Filled 2011-08-25: qty 5

## 2011-08-25 MED ORDER — SODIUM CHLORIDE 0.9 % IV SOLN
Freq: Once | INTRAVENOUS | Status: AC
  Administered 2011-08-25: 15:00:00 via INTRAVENOUS

## 2011-08-25 MED ORDER — SODIUM CHLORIDE 0.9 % IV SOLN
3.6000 mg/kg | Freq: Once | INTRAVENOUS | Status: AC
  Administered 2011-08-25: 240 mg via INTRAVENOUS
  Filled 2011-08-25: qty 12

## 2011-08-25 NOTE — Progress Notes (Signed)
Hematology and Oncology Follow Up Visit  Kathy Jimenez [id number] [birth year] 41 y.o. 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 Currently day 1 of cycle 4 of every 3 week Kadcyla. She also receives monthly Faslodex, Zoladex, and Xgeva  Interim History:   Kathy Jimenez is seen today prior to initiating her fourth cycle of every 3 week Kadcyla. She is also due for her monthly Faslodex, Zoladex, and Xgeva.  She is feeling quite well, denying any fevers, chills, or night sweats. No nausea, emesis, diarrhea, or constipation issues. She's had no jaundice, no shortness of breath or chest pain. Her energy level has improved.  A detailed review of systems is otherwise noncontributory as noted below.  Review of Systems: Constitutional:  no weight loss, fever, night sweats and feels well Eyes: no complaints ENT: no complaints Cardiovascular: no chest pain or dyspnea on exertion Respiratory: no cough, shortness of breath, or wheezing Neurological: no TIA or stroke symptoms Dermatological: negative Gastrointestinal: no abdominal pain, change in bowel habits, or black or bloody stools Genito-Urinary: no dysuria, trouble voiding, or hematuria Hematological and Lymphatic: negative Breast: negative Musculoskeletal: negative Remaining ROS negative.   Medications:   I have reviewed the patient's current medications.  Current Outpatient Prescriptions  Medication Sig Dispense Refill  . carvedilol (COREG) 12.5 MG tablet Take 12.5 mg by mouth 2 (two) times daily with a meal.      . digoxin (LANOXIN) 0.125 MG tablet Take 1 tablet (0.125 mg total) by mouth daily.  30 tablet  6  . lisinopril (PRINIVIL,ZESTRIL) 10 MG tablet Take 1 tablet (10 mg total) by mouth 2 (two) times daily.  60 tablet  6  . SODIUM CITRATE PO Take 1 tablet by mouth 2 (two) times daily.       Marland Kitchen DISCONTD: furosemide  (LASIX) 20 MG tablet Take 1 tablet (20 mg total) by mouth daily as needed (If weight increases 3 pounds in 24 hours).  30 tablet  6  . DISCONTD: spironolactone (ALDACTONE) 25 MG tablet Take 1 tablet (25 mg total) by mouth daily.  30 tablet  6    Allergies:  Allergies  Allergen Reactions  . Sulfa Antibiotics Hives    Physical Exam: Filed Vitals:   08/25/11 1429  BP: 112/73  Pulse: 87  Temp: 98.3 F (36.8 C)    Body mass index is 24.77 kg/(m^2). Weight: 144 lbs. HEENT:  Sclerae anicteric, conjunctivae pink.  Oropharynx clear.  No mucositis or candidiasis.   Nodes:  No cervical, supraclavicular, or axillary lymphadenopathy palpated.   Lungs:  Clear to auscultation bilaterally.  No crackles, rhonchi, or wheezes.   Heart:  Regular rate and rhythm.   Abdomen:  Abdomen is soft, nontender, without evidence of hepatomegaly. Normal bowel sounds are present. No evidence of distention. Musculoskeletal:  No focal spinal tenderness to palpation.  Extremities:  Benign.  No peripheral edema or cyanosis.   Skin:  Benign.   Neuro:  Nonfocal, alert and oriented x 3.   Lab Results: Lab Results  Component Value Date   WBC 7.7 08/25/2011   HGB 12.7 08/25/2011   HCT 37.1 08/25/2011   MCV 92.0 08/25/2011   PLT 246 08/25/2011   NEUTROABS 4.3 08/25/2011     Chemistry      Component Value Date/Time   NA 141 08/04/2011 1405   NA 141 09/02/2010 1134   K 4.3 08/04/2011 1405  K 4.3 09/02/2010 1134   CL 101 08/04/2011 1405   CL 103 09/02/2010 1134   CO2 29 08/04/2011 1405   CO2 25 09/02/2010 1134   BUN 20 08/04/2011 1405   BUN 11 09/02/2010 1134   CREATININE 0.99 08/04/2011 1405   CREATININE 0.7 09/02/2010 1134      Component Value Date/Time   CALCIUM 10.1 08/04/2011 1405   CALCIUM 8.4 09/02/2010 1134   ALKPHOS 79 08/04/2011 1405   ALKPHOS 84 09/02/2010 1134   AST 26 08/04/2011 1405   AST 27 09/02/2010 1134   ALT 18 08/04/2011 1405   BILITOT 0.5 08/04/2011 1405   BILITOT 0.60 09/02/2010 1134      Lab  Results  Component Value Date   LABCA2 68* 08/04/2011    Radiological Studies: Ct Head W Wo Contrast 06/15/2011  *RADIOLOGY REPORT*  Clinical Data: History of breast cancer.  Evaluate for brain metastases.  CT HEAD WITHOUT AND WITH CONTRAST  Technique:  Contiguous axial images were obtained from the base of the skull through the vertex without and with intravenous contrast.  Contrast:  80 ml of omni 300  Comparison: 10/05/2010  Findings: The brain has a normal appearance without evidence for hemorrhage, infarction, hydrocephalus, or mass lesion.  There is no extra axial fluid collection.  The skull and paranasal sinuses are normal.  IMPRESSION:  1.  Normal appearance of the brain.  No specific features identified to suggest bone metastases.  Original Report Authenticated By: Rosealee Albee, M.D.   Ct Abdomen Pelvis W Contrast 06/15/2011  *RADIOLOGY REPORT*  Clinical Data: Abdominal pain with nausea and vomiting.  Metastatic breast cancer.  CT ABDOMEN AND PELVIS WITH CONTRAST  Technique:  Multidetector CT imaging of the abdomen and pelvis was performed following the standard protocol during bolus administration of intravenous contrast.  Contrast: 80mL OMNIPAQUE IOHEXOL 300 MG/ML IJ SOLN  Comparison: CT scan dated 12/20/2010 and PET CT scan dated 03/20/2011  Findings: The metastatic lesions in the right and left lobes of the liver have progressed in size and number.  The metastases in the left lobe are immediately adjacent to the lesser curvature of the stomach.  The spleen, pancreas, adrenal glands, and kidneys demonstrate no acute abnormalities.  The bowel is normal.  Uterus and ovaries are normal.  No free air or free fluid.  Sclerotic metastases in the spine and pelvis are unchanged.  No new osseous lesions.  Lung bases are clear.  IMPRESSION: Progressive metastatic disease in the liver.  No other change.  Original Report Authenticated By: Gwynn Burly, M.D.   Dg Abd Portable 1v 06/15/2011  *RADIOLOGY  REPORT*  Clinical Data: 41 year old female with abdominal pain nausea and vomiting.  History of breast cancer.  PORTABLE ABDOMEN - 1 VIEW  Technique: Portable supine AP view of the abdomen 1109 hours.  Comparison:   03/24/2011.  Findings: Nonobstructed bowel gas pattern.Abdominal and pelvic visceral contours are within normal limits. Stable visualized osseous structures.    IMPRESSION:  Nonobstructed bowel gas pattern.  Original Report Authenticated By: Harley Hallmark, M.D.     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 Currently day 1 of cycle 4 of every 3 week Kadcyla. She also receives monthly Faslodex, Zoladex, and Xgeva, for which she is due in 1 weeks time 2. CHF: followed closely by Dr. Gala Romney. Recent ECHO fairly stable per Cardiology note.   Case to be  reviewed with Dr. Pierce Crane.   Plan:  Kathy Jimenez will receive her fourth cycle of Kadcyla today as scheduled.  We will officially see her back in 3 weeks time prior to day 1 cycle 5 of Kadcyla, though she knows not to hesitate to contact us prior if the need should arise.  This plan was reviewed with the patient, who voices understanding and agreement.  She knows to call with any changes or problems.    Nariah Morgano T, PA-C 08/25/2011

## 2011-08-27 ENCOUNTER — Other Ambulatory Visit: Payer: Self-pay | Admitting: Oncology

## 2011-09-14 ENCOUNTER — Other Ambulatory Visit: Payer: Self-pay | Admitting: Physician Assistant

## 2011-09-14 DIAGNOSIS — C50919 Malignant neoplasm of unspecified site of unspecified female breast: Secondary | ICD-10-CM

## 2011-09-15 ENCOUNTER — Other Ambulatory Visit (HOSPITAL_BASED_OUTPATIENT_CLINIC_OR_DEPARTMENT_OTHER): Payer: BC Managed Care – PPO | Admitting: Lab

## 2011-09-15 ENCOUNTER — Ambulatory Visit (HOSPITAL_BASED_OUTPATIENT_CLINIC_OR_DEPARTMENT_OTHER): Payer: BC Managed Care – PPO

## 2011-09-15 ENCOUNTER — Ambulatory Visit (HOSPITAL_BASED_OUTPATIENT_CLINIC_OR_DEPARTMENT_OTHER): Payer: BC Managed Care – PPO | Admitting: Physician Assistant

## 2011-09-15 ENCOUNTER — Encounter: Payer: Self-pay | Admitting: Physician Assistant

## 2011-09-15 VITALS — BP 119/79 | HR 78 | Temp 98.2°F

## 2011-09-15 DIAGNOSIS — R000 Tachycardia, unspecified: Secondary | ICD-10-CM

## 2011-09-15 DIAGNOSIS — C50919 Malignant neoplasm of unspecified site of unspecified female breast: Secondary | ICD-10-CM

## 2011-09-15 DIAGNOSIS — Z5112 Encounter for antineoplastic immunotherapy: Secondary | ICD-10-CM

## 2011-09-15 DIAGNOSIS — Z170 Estrogen receptor positive status [ER+]: Secondary | ICD-10-CM

## 2011-09-15 DIAGNOSIS — Z5111 Encounter for antineoplastic chemotherapy: Secondary | ICD-10-CM

## 2011-09-15 DIAGNOSIS — C801 Malignant (primary) neoplasm, unspecified: Secondary | ICD-10-CM

## 2011-09-15 DIAGNOSIS — C787 Secondary malignant neoplasm of liver and intrahepatic bile duct: Secondary | ICD-10-CM

## 2011-09-15 DIAGNOSIS — C7951 Secondary malignant neoplasm of bone: Secondary | ICD-10-CM

## 2011-09-15 DIAGNOSIS — C7952 Secondary malignant neoplasm of bone marrow: Secondary | ICD-10-CM

## 2011-09-15 LAB — COMPREHENSIVE METABOLIC PANEL
ALT: 21 U/L (ref 0–35)
AST: 31 U/L (ref 0–37)
Alkaline Phosphatase: 71 U/L (ref 39–117)
Calcium: 10 mg/dL (ref 8.4–10.5)
Chloride: 103 mEq/L (ref 96–112)
Creatinine, Ser: 0.89 mg/dL (ref 0.50–1.10)

## 2011-09-15 LAB — CBC WITH DIFFERENTIAL/PLATELET
BASO%: 0.9 % (ref 0.0–2.0)
EOS%: 3 % (ref 0.0–7.0)
HCT: 35.8 % (ref 34.8–46.6)
MCH: 31 pg (ref 25.1–34.0)
MCHC: 34.3 g/dL (ref 31.5–36.0)
MONO%: 9.5 % (ref 0.0–14.0)
NEUT%: 54.7 % (ref 38.4–76.8)
RDW: 12.6 % (ref 11.2–14.5)
lymph#: 2.5 10*3/uL (ref 0.9–3.3)

## 2011-09-15 MED ORDER — DIPHENHYDRAMINE HCL 25 MG PO CAPS: 50.0000 mg | ORAL_CAPSULE | Freq: Once | ORAL | Status: DC

## 2011-09-15 MED ORDER — SODIUM CHLORIDE 0.9 % IJ SOLN
10.0000 mL | INTRAMUSCULAR | Status: DC | PRN
Start: 2011-09-15 — End: 2011-09-15
  Filled 2011-09-15: qty 10

## 2011-09-15 MED ORDER — GOSERELIN ACETATE 3.6 MG ~~LOC~~ IMPL
3.6000 mg | DRUG_IMPLANT | Freq: Once | SUBCUTANEOUS | Status: AC
  Administered 2011-09-15: 3.6 mg via SUBCUTANEOUS
  Filled 2011-09-15: qty 3.6

## 2011-09-15 MED ORDER — FULVESTRANT 250 MG/5ML IM SOLN
500.0000 mg | Freq: Once | INTRAMUSCULAR | Status: AC
Start: 2011-09-15 — End: 2011-09-15
  Administered 2011-09-15: 500 mg via INTRAMUSCULAR
  Filled 2011-09-15: qty 10

## 2011-09-15 MED ORDER — HEPARIN SOD (PORK) LOCK FLUSH 100 UNIT/ML IV SOLN
500.0000 [IU] | Freq: Once | INTRAVENOUS | Status: DC | PRN
  Filled 2011-09-15: qty 5

## 2011-09-15 MED ORDER — SODIUM CHLORIDE 0.9 % IV SOLN: Freq: Once | INTRAVENOUS | Status: DC

## 2011-09-15 MED ORDER — ACETAMINOPHEN 325 MG PO TABS
650.0000 mg | ORAL_TABLET | Freq: Once | ORAL | Status: AC
  Administered 2011-09-15: 650 mg via ORAL

## 2011-09-15 MED ORDER — DENOSUMAB 120 MG/1.7ML ~~LOC~~ SOLN
120.0000 mg | Freq: Once | SUBCUTANEOUS | Status: AC
Start: 2011-09-15 — End: 2011-09-15
  Administered 2011-09-15: 120 mg via SUBCUTANEOUS
  Filled 2011-09-15: qty 1.7

## 2011-09-15 MED ORDER — SODIUM CHLORIDE 0.9 % IV SOLN
3.6000 mg/kg | Freq: Once | INTRAVENOUS | Status: AC
  Administered 2011-09-15: 240 mg via INTRAVENOUS
  Filled 2011-09-15: qty 12

## 2011-09-15 NOTE — Progress Notes (Signed)
Hematology and Oncology Follow Up Visit  Kathy Jimenez [id number] 05-02-1970 41 y.o. 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 Currently day 1 of cycle 5 of every 3 week Kadcyla. She also receives every 3 week Faslodex, Zoladex, and Xgeva  Interim History:   Kathy Jimenez is seen today prior to initiating her fifth cycle of every 3 week Kadcyla. She is also due for Faslodex, Zoladex, and Xgeva.  She is feeling quite well, denying any fevers, chills, or night sweats. No nausea, emesis, diarrhea, or constipation issues. She's had no jaundice, no shortness of breath or chest pain. Her energy level has improved.  She is very happy today, her daughter graduated from high school today. A detailed review of systems is otherwise noncontributory as noted below.  Review of Systems: Constitutional:  no weight loss, fever, night sweats and feels well Eyes: no complaints ENT: no complaints Cardiovascular: no chest pain or dyspnea on exertion Respiratory: no cough, shortness of breath, or wheezing Neurological: no TIA or stroke symptoms Dermatological: negative Gastrointestinal: no abdominal pain, change in bowel habits, or black or bloody stools Genito-Urinary: no dysuria, trouble voiding, or hematuria Hematological and Lymphatic: negative Breast: negative Musculoskeletal: negative Remaining ROS negative.   Medications:   I have reviewed the patient's current medications.  Current Outpatient Prescriptions  Medication Sig Dispense Refill  . carvedilol (COREG) 12.5 MG tablet Take 12.5 mg by mouth 2 (two) times daily with a meal.      . digoxin (LANOXIN) 0.125 MG tablet Take 1 tablet (0.125 mg total) by mouth daily.  30 tablet  6  . lisinopril (PRINIVIL,ZESTRIL) 10 MG tablet Take 1 tablet (10 mg total) by mouth 2 (two) times daily.  60 tablet  6  . SODIUM CITRATE PO Take 1 tablet  by mouth 2 (two) times daily.       Marland Kitchen DISCONTD: furosemide (LASIX) 20 MG tablet Take 1 tablet (20 mg total) by mouth daily as needed (If weight increases 3 pounds in 24 hours).  30 tablet  6  . DISCONTD: spironolactone (ALDACTONE) 25 MG tablet Take 1 tablet (25 mg total) by mouth daily.  30 tablet  6   No current facility-administered medications for this visit.   Facility-Administered Medications Ordered in Other Visits  Medication Dose Route Frequency Provider Last Rate Last Dose  . 0.9 %  sodium chloride infusion   Intravenous Once Pierce Crane, MD      . acetaminophen (TYLENOL) tablet 650 mg  650 mg Oral Once Pierce Crane, MD   650 mg at 09/15/11 1550  . ado-trastuzumab emtansine (KADCYLA) 240 mg in sodium chloride 0.9 % 250 mL chemo infusion  3.6 mg/kg (Treatment Plan Actual) Intravenous Once Pierce Crane, MD      . diphenhydrAMINE (BENADRYL) capsule 50 mg  50 mg Oral Once Pierce Crane, MD      . fulvestrant (FASLODEX) injection 500 mg  500 mg Intramuscular Once Pierce Crane, MD      . goserelin (ZOLADEX) injection 3.6 mg  3.6 mg Subcutaneous Once Pierce Crane, MD      . heparin lock flush 100 unit/mL  500 Units Intracatheter Once PRN Pierce Crane, MD      . sodium chloride 0.9 % injection 10 mL  10 mL Intracatheter PRN Pierce Crane, MD        Allergies:  Allergies  Allergen Reactions  . Sulfa Antibiotics Hives  Physical Exam: There were no vitals filed for this visit.  There is no height or weight on file to calculate BMI. Weight: 144 lbs. HEENT:  Sclerae anicteric, conjunctivae pink.  Oropharynx clear.  No mucositis or candidiasis.   Nodes:  No cervical, supraclavicular, or axillary lymphadenopathy palpated.   Lungs:  Clear to auscultation bilaterally.  No crackles, rhonchi, or wheezes.   Heart:  Regular rate and rhythm.   Abdomen:  Abdomen is soft, nontender, without evidence of hepatomegaly. Normal bowel sounds are present. No evidence of distention. Musculoskeletal:  No focal  spinal tenderness to palpation.  Extremities:  Benign.  No peripheral edema or cyanosis.   Skin:  Benign.   Neuro:  Nonfocal, alert and oriented x 3.   Lab Results: Lab Results  Component Value Date   WBC 7.9 09/15/2011   HGB 12.3 09/15/2011   HCT 35.8 09/15/2011   MCV 90.2 09/15/2011   PLT 219 09/15/2011   NEUTROABS 4.3 09/15/2011     Chemistry      Component Value Date/Time   NA 140 08/25/2011 1412   NA 141 09/02/2010 1134   K 3.8 08/25/2011 1412   K 4.3 09/02/2010 1134   CL 103 08/25/2011 1412   CL 103 09/02/2010 1134   CO2 29 08/25/2011 1412   CO2 25 09/02/2010 1134   BUN 16 08/25/2011 1412   BUN 11 09/02/2010 1134   CREATININE 0.97 08/25/2011 1412   CREATININE 0.7 09/02/2010 1134      Component Value Date/Time   CALCIUM 9.7 08/25/2011 1412   CALCIUM 8.4 09/02/2010 1134   ALKPHOS 67 08/25/2011 1412   ALKPHOS 84 09/02/2010 1134   AST 26 08/25/2011 1412   AST 27 09/02/2010 1134   ALT 16 08/25/2011 1412   BILITOT 0.5 08/25/2011 1412   BILITOT 0.60 09/02/2010 1134      Lab Results  Component Value Date   LABCA2 49* 08/25/2011    Radiological Studies: Ct Head W Wo Contrast 06/15/2011  *RADIOLOGY REPORT*  Clinical Data: History of breast cancer.  Evaluate for brain metastases.  CT HEAD WITHOUT AND WITH CONTRAST  Technique:  Contiguous axial images were obtained from the base of the skull through the vertex without and with intravenous contrast.  Contrast:  80 ml of omni 300  Comparison: 10/05/2010  Findings: The brain has a normal appearance without evidence for hemorrhage, infarction, hydrocephalus, or mass lesion.  There is no extra axial fluid collection.  The skull and paranasal sinuses are normal.  IMPRESSION:  1.  Normal appearance of the brain.  No specific features identified to suggest bone metastases.  Original Report Authenticated By: Rosealee Albee, M.D.   Ct Abdomen Pelvis W Contrast 06/15/2011  *RADIOLOGY REPORT*  Clinical Data: Abdominal pain with nausea and vomiting.  Metastatic  breast cancer.  CT ABDOMEN AND PELVIS WITH CONTRAST  Technique:  Multidetector CT imaging of the abdomen and pelvis was performed following the standard protocol during bolus administration of intravenous contrast.  Contrast: 80mL OMNIPAQUE IOHEXOL 300 MG/ML IJ SOLN  Comparison: CT scan dated 12/20/2010 and PET CT scan dated 03/20/2011  Findings: The metastatic lesions in the right and left lobes of the liver have progressed in size and number.  The metastases in the left lobe are immediately adjacent to the lesser curvature of the stomach.  The spleen, pancreas, adrenal glands, and kidneys demonstrate no acute abnormalities.  The bowel is normal.  Uterus and ovaries are normal.  No free air or free fluid.  Sclerotic metastases in the spine and pelvis are unchanged.  No new osseous lesions.  Lung bases are clear.  IMPRESSION: Progressive metastatic disease in the liver.  No other change.  Original Report Authenticated By: Gwynn Burly, M.D.   Dg Abd Portable 1v 06/15/2011  *RADIOLOGY REPORT*  Clinical Data: 41 year old female with abdominal pain nausea and vomiting.  History of breast cancer.  PORTABLE ABDOMEN - 1 VIEW  Technique: Portable supine AP view of the abdomen 1109 hours.  Comparison:   03/24/2011.  Findings: Nonobstructed bowel gas pattern.Abdominal and pelvic visceral contours are within normal limits. Stable visualized osseous structures.    IMPRESSION:  Nonobstructed bowel gas pattern.  Original Report Authenticated By: Harley Hallmark, M.D.     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 Currently day 1 of cycle 5 of every 3 week Kadcyla. She also receives every 3 week Faslodex, Zoladex, and Xgeva, for which she is due today.  2. CHF: followed closely by Dr. Gala Romney. Recent ECHO fairly stable per Cardiology note.   Case to be reviewed with Dr. Pierce Crane.   Plan:  Kathy Jimenez  will receive her fifth cycle of Kadcyla today as scheduled.  We will officially see her back in 3 weeks time prior to day 1 cycle 6 of Kadcyla, though she knows not to hesitate to contact us prior if the need should arise.  This plan was reviewed with the patient, who voices understanding and agreement.  She knows to call with any changes or problems.    Henri Guedes T, PA-C 09/15/2011

## 2011-09-19 ENCOUNTER — Telehealth: Payer: Self-pay | Admitting: *Deleted

## 2011-09-19 NOTE — Telephone Encounter (Signed)
patient daughter's called cancelled appointment patient has broken her hip

## 2011-09-19 NOTE — Telephone Encounter (Signed)
Per e mail from [place], I scheduled teratment appts. JMW

## 2011-09-19 NOTE — Telephone Encounter (Signed)
gave patient appointments for 10-06-2011 10-27-2011 11-17-2011 sent michelle email to set up treatment for the patient

## 2011-10-06 ENCOUNTER — Ambulatory Visit (HOSPITAL_BASED_OUTPATIENT_CLINIC_OR_DEPARTMENT_OTHER): Payer: BC Managed Care – PPO

## 2011-10-06 ENCOUNTER — Encounter: Payer: Self-pay | Admitting: Physician Assistant

## 2011-10-06 ENCOUNTER — Other Ambulatory Visit (HOSPITAL_BASED_OUTPATIENT_CLINIC_OR_DEPARTMENT_OTHER): Payer: BC Managed Care – PPO | Admitting: Lab

## 2011-10-06 ENCOUNTER — Ambulatory Visit (HOSPITAL_BASED_OUTPATIENT_CLINIC_OR_DEPARTMENT_OTHER): Payer: BC Managed Care – PPO | Admitting: Physician Assistant

## 2011-10-06 VITALS — BP 116/76 | HR 103 | Temp 98.1°F | Ht 64.0 in | Wt 143.8 lb

## 2011-10-06 DIAGNOSIS — C7952 Secondary malignant neoplasm of bone marrow: Secondary | ICD-10-CM

## 2011-10-06 DIAGNOSIS — Z5112 Encounter for antineoplastic immunotherapy: Secondary | ICD-10-CM

## 2011-10-06 DIAGNOSIS — C50919 Malignant neoplasm of unspecified site of unspecified female breast: Secondary | ICD-10-CM

## 2011-10-06 DIAGNOSIS — C787 Secondary malignant neoplasm of liver and intrahepatic bile duct: Secondary | ICD-10-CM

## 2011-10-06 DIAGNOSIS — C7951 Secondary malignant neoplasm of bone: Secondary | ICD-10-CM

## 2011-10-06 DIAGNOSIS — C801 Malignant (primary) neoplasm, unspecified: Secondary | ICD-10-CM

## 2011-10-06 LAB — CBC WITH DIFFERENTIAL/PLATELET
BASO%: 0.8 % (ref 0.0–2.0)
EOS%: 3.9 % (ref 0.0–7.0)
HCT: 38.4 % (ref 34.8–46.6)
HGB: 13.2 g/dL (ref 11.6–15.9)
MCHC: 34.4 g/dL (ref 31.5–36.0)
MCV: 89 fL (ref 79.5–101.0)
NEUT#: 4.7 10*3/uL (ref 1.5–6.5)
Platelets: 242 10*3/uL (ref 145–400)
RDW: 13.1 % (ref 11.2–14.5)
lymph#: 2.7 10*3/uL (ref 0.9–3.3)

## 2011-10-06 LAB — COMPREHENSIVE METABOLIC PANEL
ALT: 24 U/L (ref 0–35)
AST: 34 U/L (ref 0–37)
Alkaline Phosphatase: 83 U/L (ref 39–117)
Sodium: 136 mEq/L (ref 135–145)
Total Bilirubin: 0.4 mg/dL (ref 0.3–1.2)
Total Protein: 7.9 g/dL (ref 6.0–8.3)

## 2011-10-06 LAB — CANCER ANTIGEN 27.29: CA 27.29: 65 U/mL — ABNORMAL HIGH (ref 0–39)

## 2011-10-06 LAB — LACTATE DEHYDROGENASE: LDH: 410 U/L — ABNORMAL HIGH (ref 94–250)

## 2011-10-06 MED ORDER — HEPARIN SOD (PORK) LOCK FLUSH 100 UNIT/ML IV SOLN
500.0000 [IU] | Freq: Once | INTRAVENOUS | Status: AC | PRN
  Administered 2011-10-06: 500 [IU]
  Filled 2011-10-06: qty 5

## 2011-10-06 MED ORDER — SODIUM CHLORIDE 0.9 % IJ SOLN
10.0000 mL | INTRAMUSCULAR | Status: DC | PRN
  Administered 2011-10-06: 10 mL
  Filled 2011-10-06: qty 10

## 2011-10-06 MED ORDER — ACETAMINOPHEN 325 MG PO TABS
650.0000 mg | ORAL_TABLET | Freq: Once | ORAL | Status: AC
  Administered 2011-10-06: 650 mg via ORAL

## 2011-10-06 MED ORDER — SODIUM CHLORIDE 0.9 % IV SOLN
Freq: Once | INTRAVENOUS | Status: AC
  Administered 2011-10-06: 16:00:00 via INTRAVENOUS

## 2011-10-06 MED ORDER — SODIUM CHLORIDE 0.9 % IV SOLN
3.6000 mg/kg | Freq: Once | INTRAVENOUS | Status: AC
  Administered 2011-10-06: 240 mg via INTRAVENOUS
  Filled 2011-10-06: qty 12

## 2011-10-06 NOTE — Patient Instructions (Signed)
[facility] Discharge Instructions for Patients Receiving Chemotherapy  Today you received the following chemotherapy agents Kadcyla.  To help prevent nausea and vomiting after your treatment, we encourage you to take your nausea medication.  If you develop nausea and vomiting that is not controlled by your nausea medication, call the clinic. If it is after clinic hours your family physician or the after hours number for the clinic or go to the Emergency Department.   BELOW ARE SYMPTOMS THAT SHOULD BE REPORTED IMMEDIATELY:  *FEVER GREATER THAN 100.5 F  *CHILLS WITH OR WITHOUT FEVER  NAUSEA AND VOMITING THAT IS NOT CONTROLLED WITH YOUR NAUSEA MEDICATION  *UNUSUAL SHORTNESS OF BREATH  *UNUSUAL BRUISING OR BLEEDING  TENDERNESS IN MOUTH AND THROAT WITH OR WITHOUT PRESENCE OF ULCERS  *URINARY PROBLEMS  *BOWEL PROBLEMS  UNUSUAL RASH Items with * indicate a potential emergency and should be followed up as soon as possible.  One of the nurses will contact you 24 hours after your treatment. Please let the nurse know about any problems that you may have experienced. Feel free to call the clinic you have any questions or concerns. The clinic phone number is [phone].   I have been informed and understand all the instructions given to me. I know to contact the clinic, my physician, or go to the Emergency Department if any problems should occur. I do not have any questions at this time, but understand that I may call the clinic during office hours   should I have any questions or need assistance in obtaining follow up care.    __________________________________________  _____________  __________ Signature of Patient or Authorized Representative            Date                   Time    __________________________________________ Nurse's Signature

## 2011-10-09 NOTE — Progress Notes (Signed)
Hematology and Oncology Follow Up Visit  TIFANY HIRSCH [id number] 04/01/1971 41 y.o. 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 Currently day 1 of cycle 6 of every 3 week Kadcyla. She also receives every 3 week Faslodex, Zoladex, and Xgeva  Interim History:   [name] is seen today prior to initiating her sixth cycle of every 3 week Kadcyla. She is also due for Faslodex, Zoladex, and Xgeva.  She is feeling quite well, denying any fevers, chills, or night sweats. No nausea, emesis, diarrhea, or constipation issues. She's had no jaundice, no shortness of breath or chest pain. Her energy level has improved.  A detailed review of systems is otherwise noncontributory as noted below.  Review of Systems: Constitutional:  no weight loss, fever, night sweats and feels well Eyes: no complaints ENT: no complaints Cardiovascular: no chest pain or dyspnea on exertion Respiratory: no cough, shortness of breath, or wheezing Neurological: no TIA or stroke symptoms Dermatological: negative Gastrointestinal: no abdominal pain, change in bowel habits, or black or bloody stools Genito-Urinary: no dysuria, trouble voiding, or hematuria Hematological and Lymphatic: negative Breast: negative Musculoskeletal: negative Remaining ROS negative.   Medications:   I have reviewed the patient's current medications.  Current Outpatient Prescriptions  Medication Sig Dispense Refill  . carvedilol (COREG) 12.5 MG tablet Take 12.5 mg by mouth 2 (two) times daily with a meal.      . digoxin (LANOXIN) 0.125 MG tablet Take 1 tablet (0.125 mg total) by mouth daily.  30 tablet  6  . lisinopril (PRINIVIL,ZESTRIL) 10 MG tablet Take 1 tablet (10 mg total) by mouth 2 (two) times daily.  60 tablet  6  . SODIUM CITRATE PO Take 1 tablet by mouth 2 (two) times daily.       Marland Kitchen DISCONTD: furosemide (LASIX) 20 MG  tablet Take 1 tablet (20 mg total) by mouth daily as needed (If weight increases 3 pounds in 24 hours).  30 tablet  6  . DISCONTD: spironolactone (ALDACTONE) 25 MG tablet Take 1 tablet (25 mg total) by mouth daily.  30 tablet  6    Allergies:  Allergies  Allergen Reactions  . Sulfa Antibiotics Hives    Physical Exam: Filed Vitals:   10/06/11 1510  BP: 116/76  Pulse: 103  Temp: 98.1 F (36.7 C)    Body mass index is 24.68 kg/(m^2). Weight: 143 lbs. HEENT:  Sclerae anicteric, conjunctivae pink.  Oropharynx clear.  No mucositis or candidiasis.   Nodes:  No cervical, supraclavicular, or axillary lymphadenopathy palpated.   Lungs:  Clear to auscultation bilaterally.  No crackles, rhonchi, or wheezes.   Heart:  Regular rate and rhythm.   Abdomen:  Abdomen is soft, nontender, without evidence of hepatomegaly. Normal bowel sounds are present. No evidence of distention. Musculoskeletal:  No focal spinal tenderness to palpation.  Extremities:  Benign.  No peripheral edema or cyanosis.   Skin:  Benign.   Neuro:  Nonfocal, alert and oriented x 3.   Lab Results: Lab Results  Component Value Date   WBC 8.6 10/06/2011   HGB 13.2 10/06/2011   HCT 38.4 10/06/2011   MCV 89.0 10/06/2011   PLT 242 10/06/2011   NEUTROABS 4.7 10/06/2011     Chemistry      Component Value Date/Time   NA 136 10/06/2011 1447   NA 141 09/02/2010 1134   K 4.0 10/06/2011 1447   K  4.3 09/02/2010 1134   CL 98 10/06/2011 1447   CL 103 09/02/2010 1134   CO2 28 10/06/2011 1447   CO2 25 09/02/2010 1134   BUN 17 10/06/2011 1447   BUN 11 09/02/2010 1134   CREATININE 0.95 10/06/2011 1447   CREATININE 0.7 09/02/2010 1134      Component Value Date/Time   CALCIUM 10.1 10/06/2011 1447   CALCIUM 8.4 09/02/2010 1134   ALKPHOS 83 10/06/2011 1447   ALKPHOS 84 09/02/2010 1134   AST 34 10/06/2011 1447   AST 27 09/02/2010 1134   ALT 24 10/06/2011 1447   BILITOT 0.4 10/06/2011 1447   BILITOT 0.60 09/02/2010 1134      Lab Results    Component Value Date   LABCA2 65* 10/06/2011    Radiological Studies: Ct Head W Wo Contrast 06/15/2011  *RADIOLOGY REPORT*  Clinical Data: History of breast cancer.  Evaluate for brain metastases.  CT HEAD WITHOUT AND WITH CONTRAST  Technique:  Contiguous axial images were obtained from the base of the skull through the vertex without and with intravenous contrast.  Contrast:  80 ml of omni 300  Comparison: 10/05/2010  Findings: The brain has a normal appearance without evidence for hemorrhage, infarction, hydrocephalus, or mass lesion.  There is no extra axial fluid collection.  The skull and paranasal sinuses are normal.  IMPRESSION:  1.  Normal appearance of the brain.  No specific features identified to suggest bone metastases.  Original Report Authenticated By: Rosealee Albee, M.D.   Ct Abdomen Pelvis W Contrast 06/15/2011  *RADIOLOGY REPORT*  Clinical Data: Abdominal pain with nausea and vomiting.  Metastatic breast cancer.  CT ABDOMEN AND PELVIS WITH CONTRAST  Technique:  Multidetector CT imaging of the abdomen and pelvis was performed following the standard protocol during bolus administration of intravenous contrast.  Contrast: 80mL OMNIPAQUE IOHEXOL 300 MG/ML IJ SOLN  Comparison: CT scan dated 12/20/2010 and PET CT scan dated 03/20/2011  Findings: The metastatic lesions in the right and left lobes of the liver have progressed in size and number.  The metastases in the left lobe are immediately adjacent to the lesser curvature of the stomach.  The spleen, pancreas, adrenal glands, and kidneys demonstrate no acute abnormalities.  The bowel is normal.  Uterus and ovaries are normal.  No free air or free fluid.  Sclerotic metastases in the spine and pelvis are unchanged.  No new osseous lesions.  Lung bases are clear.  IMPRESSION: Progressive metastatic disease in the liver.  No other change.  Original Report Authenticated By: Gwynn Burly, M.D.   Dg Abd Portable 1v 06/15/2011  *RADIOLOGY REPORT*   Clinical Data: 41 year old female with abdominal pain nausea and vomiting.  History of breast cancer.  PORTABLE ABDOMEN - 1 VIEW  Technique: Portable supine AP view of the abdomen 1109 hours.  Comparison:   03/24/2011.  Findings: Nonobstructed bowel gas pattern.Abdominal and pelvic visceral contours are within normal limits. Stable visualized osseous structures.    IMPRESSION:  Nonobstructed bowel gas pattern.  Original Report Authenticated By: Harley Hallmark, M.D.     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 Currently day 1 of cycle 6 of every 3 week Kadcyla. She also receives every 3 week Faslodex, Zoladex, and Xgeva, for which she is due today.  2. CHF: followed closely by Dr. Gala Romney. Recent ECHO fairly stable per Cardiology note.   Case to be  reviewed with Dr. Pierce Crane.   Plan:  [name] will receive her sixth cycle of Kadcyla today as scheduled.  We will officially see her back in 3 weeks time prior to day 1 cycle 7 of Kadcyla, though she knows not to hesitate to contact us prior if the need should arise.  This plan was reviewed with the patient, who voices understanding and agreement.  She knows to call with any changes or problems.    Lukas Pelcher T, PA-C 10/06/11

## 2011-10-10 ENCOUNTER — Other Ambulatory Visit: Payer: Self-pay | Admitting: Oncology

## 2011-10-10 DIAGNOSIS — C50919 Malignant neoplasm of unspecified site of unspecified female breast: Secondary | ICD-10-CM

## 2011-10-11 ENCOUNTER — Telehealth: Payer: Self-pay | Admitting: *Deleted

## 2011-10-11 NOTE — Telephone Encounter (Signed)
Pet Scan and Mri 10-20-2011 arrival time 1:15pm patient is aware of the date and time

## 2011-10-16 ENCOUNTER — Other Ambulatory Visit: Payer: Self-pay | Admitting: Oncology

## 2011-10-16 ENCOUNTER — Ambulatory Visit (HOSPITAL_COMMUNITY)
Admission: RE | Admit: 2011-10-16 | Discharge: 2011-10-16 | Disposition: A | Discharge status: Home / Self Care | Payer: BC Managed Care – PPO | Source: Ambulatory Visit | Attending: Oncology | Admitting: Oncology

## 2011-10-16 DIAGNOSIS — C787 Secondary malignant neoplasm of liver and intrahepatic bile duct: Secondary | ICD-10-CM

## 2011-10-16 DIAGNOSIS — N200 Calculus of kidney: Secondary | ICD-10-CM | POA: Insufficient documentation

## 2011-10-16 DIAGNOSIS — N1330 Unspecified hydronephrosis: Secondary | ICD-10-CM | POA: Insufficient documentation

## 2011-10-16 DIAGNOSIS — C7951 Secondary malignant neoplasm of bone: Secondary | ICD-10-CM | POA: Insufficient documentation

## 2011-10-16 DIAGNOSIS — C50919 Malignant neoplasm of unspecified site of unspecified female breast: Secondary | ICD-10-CM | POA: Insufficient documentation

## 2011-10-16 MED ORDER — TAMOXIFEN CITRATE 20 MG PO TABS: 20.0000 mg | ORAL_TABLET | Freq: Every day | ORAL | Status: DC

## 2011-10-16 MED ORDER — TRAMADOL HCL 50 MG PO TABS: 50.0000 mg | ORAL_TABLET | Freq: Four times a day (QID) | ORAL | Status: AC | PRN

## 2011-10-16 MED ORDER — IOHEXOL 300 MG/ML  SOLN
100.0000 mL | Freq: Once | INTRAMUSCULAR | Status: AC | PRN
  Administered 2011-10-16: 100 mL via INTRAVENOUS

## 2011-10-17 ENCOUNTER — Other Ambulatory Visit (HOSPITAL_COMMUNITY): Payer: Self-pay

## 2011-10-20 ENCOUNTER — Other Ambulatory Visit (HOSPITAL_COMMUNITY): Payer: Self-pay

## 2011-10-20 ENCOUNTER — Inpatient Hospital Stay (HOSPITAL_COMMUNITY): Admission: RE | Admit: 2011-10-20 | Payer: Self-pay | Source: Ambulatory Visit

## 2011-10-26 ENCOUNTER — Other Ambulatory Visit: Payer: Self-pay | Admitting: Physician Assistant

## 2011-10-26 DIAGNOSIS — C787 Secondary malignant neoplasm of liver and intrahepatic bile duct: Secondary | ICD-10-CM

## 2011-10-27 ENCOUNTER — Other Ambulatory Visit: Payer: Self-pay | Admitting: Oncology

## 2011-10-27 ENCOUNTER — Ambulatory Visit (HOSPITAL_BASED_OUTPATIENT_CLINIC_OR_DEPARTMENT_OTHER): Payer: BC Managed Care – PPO

## 2011-10-27 ENCOUNTER — Ambulatory Visit (HOSPITAL_BASED_OUTPATIENT_CLINIC_OR_DEPARTMENT_OTHER): Payer: BC Managed Care – PPO | Admitting: Physician Assistant

## 2011-10-27 ENCOUNTER — Other Ambulatory Visit (HOSPITAL_BASED_OUTPATIENT_CLINIC_OR_DEPARTMENT_OTHER): Payer: Self-pay | Admitting: Lab

## 2011-10-27 VITALS — BP 106/73 | HR 83 | Temp 98.7°F | Ht 64.0 in | Wt 141.8 lb

## 2011-10-27 DIAGNOSIS — Z5111 Encounter for antineoplastic chemotherapy: Secondary | ICD-10-CM

## 2011-10-27 DIAGNOSIS — C7951 Secondary malignant neoplasm of bone: Secondary | ICD-10-CM

## 2011-10-27 DIAGNOSIS — C50919 Malignant neoplasm of unspecified site of unspecified female breast: Secondary | ICD-10-CM

## 2011-10-27 DIAGNOSIS — I509 Heart failure, unspecified: Secondary | ICD-10-CM

## 2011-10-27 DIAGNOSIS — C787 Secondary malignant neoplasm of liver and intrahepatic bile duct: Secondary | ICD-10-CM

## 2011-10-27 DIAGNOSIS — Z5112 Encounter for antineoplastic immunotherapy: Secondary | ICD-10-CM

## 2011-10-27 DIAGNOSIS — C801 Malignant (primary) neoplasm, unspecified: Secondary | ICD-10-CM

## 2011-10-27 LAB — CBC WITH DIFFERENTIAL/PLATELET
Basophils Absolute: 0.1 10*3/uL (ref 0.0–0.1)
EOS%: 3.2 % (ref 0.0–7.0)
Eosinophils Absolute: 0.3 10*3/uL (ref 0.0–0.5)
HCT: 38 % (ref 34.8–46.6)
HGB: 13.3 g/dL (ref 11.6–15.9)
MCH: 30.8 pg (ref 25.1–34.0)
MCV: 88.1 fL (ref 79.5–101.0)
MONO%: 9.8 % (ref 0.0–14.0)
NEUT#: 4.9 10*3/uL (ref 1.5–6.5)
NEUT%: 56.8 % (ref 38.4–76.8)
Platelets: 225 10*3/uL (ref 145–400)

## 2011-10-27 LAB — COMPREHENSIVE METABOLIC PANEL
AST: 39 U/L — ABNORMAL HIGH (ref 0–37)
Albumin: 4.3 g/dL (ref 3.5–5.2)
Alkaline Phosphatase: 80 U/L (ref 39–117)
BUN: 14 mg/dL (ref 6–23)
Calcium: 10 mg/dL (ref 8.4–10.5)
Chloride: 103 mEq/L (ref 96–112)
Creatinine, Ser: 0.98 mg/dL (ref 0.50–1.10)
Glucose, Bld: 105 mg/dL — ABNORMAL HIGH (ref 70–99)
Potassium: 3.7 mEq/L (ref 3.5–5.3)

## 2011-10-27 LAB — CANCER ANTIGEN 27.29: CA 27.29: 98 U/mL — ABNORMAL HIGH (ref 0–39)

## 2011-10-27 MED ORDER — HEPARIN SOD (PORK) LOCK FLUSH 100 UNIT/ML IV SOLN
500.0000 [IU] | Freq: Once | INTRAVENOUS | Status: AC | PRN
  Administered 2011-10-27: 500 [IU]
  Filled 2011-10-27: qty 5

## 2011-10-27 MED ORDER — GOSERELIN ACETATE 3.6 MG ~~LOC~~ IMPL
3.6000 mg | DRUG_IMPLANT | Freq: Once | SUBCUTANEOUS | Status: AC
  Administered 2011-10-27: 3.6 mg via SUBCUTANEOUS
  Filled 2011-10-27: qty 3.6

## 2011-10-27 MED ORDER — SODIUM CHLORIDE 0.9 % IJ SOLN
10.0000 mL | INTRAMUSCULAR | Status: DC | PRN
  Administered 2011-10-27: 10 mL
  Filled 2011-10-27: qty 10

## 2011-10-27 MED ORDER — SODIUM CHLORIDE 0.9 % IV SOLN
Freq: Once | INTRAVENOUS | Status: AC
  Administered 2011-10-27: 16:00:00 via INTRAVENOUS

## 2011-10-27 MED ORDER — FULVESTRANT 250 MG/5ML IM SOLN
500.0000 mg | Freq: Once | INTRAMUSCULAR | Status: AC
  Administered 2011-10-27: 500 mg via INTRAMUSCULAR
  Filled 2011-10-27: qty 10

## 2011-10-27 MED ORDER — SODIUM CHLORIDE 0.9 % IV SOLN
3.6000 mg/kg | Freq: Once | INTRAVENOUS | Status: AC
  Administered 2011-10-27: 240 mg via INTRAVENOUS
  Filled 2011-10-27: qty 12

## 2011-10-27 MED ORDER — DENOSUMAB 120 MG/1.7ML ~~LOC~~ SOLN
120.0000 mg | Freq: Once | SUBCUTANEOUS | Status: AC
  Administered 2011-10-27: 120 mg via SUBCUTANEOUS
  Filled 2011-10-27: qty 1.7

## 2011-10-27 MED ORDER — ACETAMINOPHEN 325 MG PO TABS
650.0000 mg | ORAL_TABLET | Freq: Once | ORAL | Status: AC
  Administered 2011-10-27: 650 mg via ORAL

## 2011-10-27 NOTE — Progress Notes (Signed)
Hematology and Oncology Follow Up Visit  Kathy Jimenez [id number] 1971/03/18 41 y.o. 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 Currently day 1 of cycle 7 of every 3 week Kadcyla. She also receives every 3 week Faslodex, Zoladex, and Xgeva  Interim History:   [name] is seen today prior to initiating her seventh cycle of every 3 week Kadcyla. She is also due for Faslodex, Zoladex, and Xgeva.  She is feeling quite well, denying any fevers, chills, or night sweats. No nausea, emesis, diarrhea, or constipation issues. She's had no jaundice, no shortness of breath or chest pain. Her energy level has improved. She has noted an occasional dull hard to describe pain behind the epigastric region, she has not required any pain medication for this. Of note CT of the chest abdomen and pelvis were obtained due to a bump in her CA 27-29. A detailed review of systems is otherwise noncontributory as noted below.  Review of Systems: Constitutional:  no weight loss, fever, night sweats and feels well Eyes: no complaints ENT: no complaints Cardiovascular: no chest pain or dyspnea on exertion Respiratory: no cough, shortness of breath, or wheezing Neurological: no TIA or stroke symptoms Dermatological: negative Gastrointestinal: no abdominal pain, change in bowel habits, or black or bloody stools Genito-Urinary: no dysuria, trouble voiding, or hematuria Hematological and Lymphatic: negative Breast: negative Musculoskeletal: negative Remaining ROS negative.   Medications:   I have reviewed the patient's current medications.  Current Outpatient Prescriptions  Medication Sig Dispense Refill  . carvedilol (COREG) 12.5 MG tablet Take 12.5 mg by mouth 2 (two) times daily with a meal.      . digoxin (LANOXIN) 0.125 MG tablet Take 1 tablet (0.125 mg total) by mouth daily.  30 tablet  6  .  lisinopril (PRINIVIL,ZESTRIL) 10 MG tablet Take 1 tablet (10 mg total) by mouth 2 (two) times daily.  60 tablet  6  . SODIUM CITRATE PO Take 1 tablet by mouth 2 (two) times daily.       . tamoxifen (NOLVADEX) 20 MG tablet Take 1 tablet (20 mg total) by mouth daily.  30 tablet  3  . traMADol (ULTRAM) 50 MG tablet Take 1 tablet (50 mg total) by mouth every 6 (six) hours as needed for pain.  30 tablet  2  . DISCONTD: furosemide (LASIX) 20 MG tablet Take 1 tablet (20 mg total) by mouth daily as needed (If weight increases 3 pounds in 24 hours).  30 tablet  6  . DISCONTD: spironolactone (ALDACTONE) 25 MG tablet Take 1 tablet (25 mg total) by mouth daily.  30 tablet  6    Allergies:  Allergies  Allergen Reactions  . Sulfa Antibiotics Hives    Physical Exam: Filed Vitals:   10/27/11 1415  BP: 106/73  Pulse: 83  Temp: 98.7 F (37.1 C)    Body mass index is 24.34 kg/(m^2). Weight: 141 lbs. HEENT:  Sclerae anicteric, conjunctivae pink.  Oropharynx clear.  No mucositis or candidiasis.   Nodes:  No cervical, supraclavicular, or axillary lymphadenopathy palpated.   Lungs:  Clear to auscultation bilaterally.  No crackles, rhonchi, or wheezes.   Heart:  Regular rate and rhythm.   Abdomen:  Abdomen is soft, nontender, without evidence of hepatomegaly. Normal bowel sounds are present. No evidence of distention. Musculoskeletal:  No focal spinal tenderness to palpation.  Extremities:  Benign.  No peripheral edema or  cyanosis.   Skin:  Benign.   Neuro:  Nonfocal, alert and oriented x 3.   Lab Results: Lab Results  Component Value Date   WBC 8.6 10/27/2011   HGB 13.3 10/27/2011   HCT 38.0 10/27/2011   MCV 88.1 10/27/2011   PLT 225 10/27/2011   NEUTROABS 4.9 10/27/2011     Chemistry      Component Value Date/Time   NA 136 10/06/2011 1447   NA 141 09/02/2010 1134   K 4.0 10/06/2011 1447   K 4.3 09/02/2010 1134   CL 98 10/06/2011 1447   CL 103 09/02/2010 1134   CO2 28 10/06/2011 1447   CO2 25  09/02/2010 1134   BUN 17 10/06/2011 1447   BUN 11 09/02/2010 1134   CREATININE 0.95 10/06/2011 1447   CREATININE 0.7 09/02/2010 1134      Component Value Date/Time   CALCIUM 10.1 10/06/2011 1447   CALCIUM 8.4 09/02/2010 1134   ALKPHOS 83 10/06/2011 1447   ALKPHOS 84 09/02/2010 1134   AST 34 10/06/2011 1447   AST 27 09/02/2010 1134   ALT 24 10/06/2011 1447   BILITOT 0.4 10/06/2011 1447   BILITOT 0.60 09/02/2010 1134      Lab Results  Component Value Date   LABCA2 65* 10/06/2011    Radiological Studies: 10/16/11 CT CHEST, ABDOMEN AND PELVIS WITH CONTRAST  Technique: Multidetector CT imaging of the chest, abdomen and  pelvis was performed following the standard protocol during bolus  administration of intravenous contrast.  Contrast: OMNIPAQUE IOHEXOL 300 MG/ML SOLN  Comparison: Chest CT from 02/22/2011, abdomen CT from 06/15/2011  CT CHEST  Findings: No axillary or supraclavicular adenopathy. There is no  mediastinal or hilar adenopathy. No pericardial or pleural  effusion.  The lungs are clear. No pulmonary nodule or mass identified.  Review of the visualized osseous structures shows a metastatic  lesion within the L1 vertebra. This measures 1.3 cm, image 55,  unchanged from previous exam. There are sclerotic metastasis  involving the manubrium and sternum which are similar to previous exam.  IMPRESSION:  1. No pulmonary nodule or metastatic adenopathy identified.  2. Similar appearance of the sclerotic bone metastasis  CT ABDOMEN AND PELVIS  Findings: The spleen is negative. The gallbladder is normal. No  biliary dilatation. Normal appearance of the pancreas. Multifocal  liver metastasis are again noted. The medial right hepatic lobe  lesion has resolved in the interval. Lesion within the lateral  segment of the left hepatic lobe measures 3.9 x 5.8 cm, image 44.  Unchanged from previous exam. Anterior right hepatic lobe lesion  measures 1.5 x 1.0 cm, image 50. Previously this  measured 1.9 x  1.6 cm. The stomach appears normal. The small bowel loops have a normal course and caliber. The appendix is identified and appears normal. Normal appearance of the colon.  Both adrenal glands are normal. Left kidney contains several  punctate stones measuring approximately 2 mm. There is right-sided hydronephrosis and hydroureter. This appears increased from previous exam. Within the distal ureter there is a stone measuring 3 mm, image 96. New from previous exam. The urinary bladder appears normal. The uterus and adnexal structures are unremarkable.  No free fluid within the abdomen or pelvis. There is a  gastrohepatic ligament lymph node which measures 0.9 cm.  Previously 0.6 cm. No additional enlarged or enlarging abdominal  or pelvic lymph nodes. Mixed sclerotic and lytic metastasis  involving the left pubic rami is unchanged in appearance from  previous  exam, image 114.  IMPRESSION:  1. Overall improvement in liver metastases.  2. Borderline enlarged gastrohepatic ligament lymph node has  increased in size from previous exam.  3. Progressive right hydronephrosis secondary to distal right  ureteral calculus.  These results will be called to the ordering clinician or  representative by the Radiologist Assistant, and communication  documented in the PACS Dashboard.  Original Report Authenticated By: Rosealee Albee, M.D.     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 Currently day 1 of cycle 7 of every 3 week Kadcyla. She also receives every 3 week Faslodex, Zoladex, and Xgeva, for which she is due today.  2. CHF: followed closely by Dr. Gala Romney. Recent ECHO fairly stable per Cardiology note, upcoming follow up appointment on 11/07/11   Case to be reviewed with Dr. Pierce Crane.   Plan:  [name] will receive her seventh cycle of Kadcyla today as  scheduled, along with Faslodex, Zoladex, and Xgeva.  We will officially see her back in 3 weeks time prior to day 1 cycle 8 of Kadcyla, though she knows not to hesitate to contact us prior if the need should arise.  This plan was reviewed with the patient, who voices understanding and agreement.  She knows to call with any changes or problems.    Nou Chard T, PA-C 10/27/11

## 2011-10-27 NOTE — Patient Instructions (Signed)
[facility] Discharge Instructions for Patients Receiving Chemotherapy  Today you received the following chemotherapy agents Kadcyla.  To help prevent nausea and vomiting after your treatment, we encourage you to take your nausea medication.  If you develop nausea and vomiting that is not controlled by your nausea medication, call the clinic. If it is after clinic hours your family physician or the after hours number for the clinic or go to the Emergency Department.   BELOW ARE SYMPTOMS THAT SHOULD BE REPORTED IMMEDIATELY:  *FEVER GREATER THAN 100.5 F  *CHILLS WITH OR WITHOUT FEVER  NAUSEA AND VOMITING THAT IS NOT CONTROLLED WITH YOUR NAUSEA MEDICATION  *UNUSUAL SHORTNESS OF BREATH  *UNUSUAL BRUISING OR BLEEDING  TENDERNESS IN MOUTH AND THROAT WITH OR WITHOUT PRESENCE OF ULCERS  *URINARY PROBLEMS  *BOWEL PROBLEMS  UNUSUAL RASH Items with * indicate a potential emergency and should be followed up as soon as possible.  One of the nurses will contact you 24 hours after your treatment. Please let the nurse know about any problems that you may have experienced. Feel free to call the clinic you have any questions or concerns. The clinic phone number is [phone].   I have been informed and understand all the instructions given to me. I know to contact the clinic, my physician, or go to the Emergency Department if any problems should occur. I do not have any questions at this time, but understand that I may call the clinic during office hours   should I have any questions or need assistance in obtaining follow up care.    __________________________________________  _____________  __________ Signature of Patient or Authorized Representative            Date                   Time    __________________________________________ Nurse's Signature    

## 2011-11-03 ENCOUNTER — Other Ambulatory Visit: Payer: Self-pay | Admitting: Oncology

## 2011-11-03 DIAGNOSIS — C50919 Malignant neoplasm of unspecified site of unspecified female breast: Secondary | ICD-10-CM

## 2011-11-08 ENCOUNTER — Other Ambulatory Visit (HOSPITAL_COMMUNITY): Payer: Self-pay | Admitting: *Deleted

## 2011-11-08 ENCOUNTER — Encounter (HOSPITAL_COMMUNITY): Payer: Self-pay

## 2011-11-08 ENCOUNTER — Ambulatory Visit (HOSPITAL_COMMUNITY)
Admission: RE | Admit: 2011-11-08 | Discharge: 2011-11-08 | Disposition: A | Discharge status: Home / Self Care | Payer: BC Managed Care – PPO | Source: Ambulatory Visit | Attending: Physician Assistant | Admitting: Physician Assistant

## 2011-11-08 ENCOUNTER — Ambulatory Visit (HOSPITAL_COMMUNITY)
Admission: RE | Admit: 2011-11-08 | Discharge: 2011-11-08 | Disposition: A | Discharge status: Home / Self Care | Payer: BC Managed Care – PPO | Source: Ambulatory Visit | Attending: Internal Medicine | Admitting: Internal Medicine

## 2011-11-08 VITALS — BP 122/82 | HR 75 | Ht 64.0 in | Wt 143.1 lb

## 2011-11-08 DIAGNOSIS — C50919 Malignant neoplasm of unspecified site of unspecified female breast: Secondary | ICD-10-CM | POA: Insufficient documentation

## 2011-11-08 DIAGNOSIS — I509 Heart failure, unspecified: Secondary | ICD-10-CM | POA: Insufficient documentation

## 2011-11-08 DIAGNOSIS — I5022 Chronic systolic (congestive) heart failure: Secondary | ICD-10-CM

## 2011-11-08 MED ORDER — LISINOPRIL 10 MG PO TABS: 10.0000 mg | ORAL_TABLET | Freq: Two times a day (BID) | ORAL | Status: DC

## 2011-11-08 MED ORDER — PROCHLORPERAZINE MALEATE 10 MG PO TABS: 10.0000 mg | ORAL_TABLET | Freq: Four times a day (QID) | ORAL | Status: DC | PRN

## 2011-11-08 MED ORDER — PROCHLORPERAZINE 25 MG RE SUPP: 25.0000 mg | Freq: Two times a day (BID) | RECTAL | Status: DC | PRN

## 2011-11-08 MED ORDER — LORAZEPAM 0.5 MG PO TABS: 0.5000 mg | ORAL_TABLET | Freq: Four times a day (QID) | ORAL | Status: DC | PRN

## 2011-11-08 MED ORDER — ONDANSETRON HCL 8 MG PO TABS: ORAL_TABLET | ORAL | Status: DC

## 2011-11-08 MED ORDER — DEXAMETHASONE 4 MG PO TABS: ORAL_TABLET | ORAL | Status: DC

## 2011-11-08 NOTE — Assessment & Plan Note (Addendum)
EF remains normal.  Will continue current regimen with sliding scale lasix.  Dr. Gala Romney left message for Dr. Donnie Coffin that ok to start chemotherapy if needed.    Patient seen and examined with Ulyess Blossom, PA-C. We discussed all aspects of the encounter. I agree with the assessment and plan as stated above. We reviewed echo in clinic and EF looks good. We have cleared her to proceed with any chemotherapy regimen that Dr. Donnie Coffin feels can help. We will continue to follow closely. Continue current meds.

## 2011-11-08 NOTE — OR Nursing (Signed)
Definity ordered per protocol by Dr Gala Romney. Pt pre v/s bp105/65 p72 r16. 2.3cc of definity/saline mix given during imaging, pt tolerated procedure well, post v/s b/p120, p72  SR resp 18.

## 2011-11-08 NOTE — Progress Notes (Signed)
HPI:  Kathy Jimenez is a 41 y.o. female with L breast CA in 2004 s/p lumpectomy and treatment with FEC (flourouracil, epirubicin, cyclophosphamide). Also treated with hormonal therapy. 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  Started on weekly herceptin. Recently also started on Perjeta and Tykerb. Dr. Donnie Coffin evaluated her for a cough and progressive DOE; CT which showed dilated heart with pleural effusion and echo with EF 25% with global HK. Mod MR/TR, lateral s' 6.1 sec/m. Admitted to the hospital for volume overload and tachycardia.  Diuresed 9 lbs, discharge weight 151 lbs.  With history of severe kidney stones, lasix 20 mg given prn.    ECHOS: 03/23/11: EF 25-30%, lateral s' 6.4 sec/m, LVIDd improved from 4.9 to 3.4.  05/24/11: EF 45-50%, diffuse hypokinesis 06/16/11: EF 60-65% 08/17/11: EF 50%  11/08/11: EF 60-65%  She is here for routine follow up today.  Has felt tired over the last week.   +dry hacky cough for 2 weeks worse at night.  Continues with TDM1/herceptin q3 weeeks but tumor markers increasing over last 9 weeks. She denies SOB/orthopnea/PND.  No palpitaitons.  Takes lasix about once every 1-2 weeks.  Continues to work full time.     ROS: All systems negative except as listed in HPI, PMH and Problem List.  Past Medical History  Diagnosis Date  . 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 Kidney stones     recurrent since 1989  . Endometrial polyp   . PONV (postoperative nausea and vomiting)   . CHF (congestive heart failure)     from chemo treatment    Current Outpatient Prescriptions  Medication Sig Dispense Refill  . carvedilol (COREG) 12.5 MG tablet Take 12.5 mg by mouth 2 (two) times daily with a meal.      . digoxin (LANOXIN) 0.125 MG tablet Take 1 tablet (0.125 mg total) by mouth daily.  30 tablet  6  . lisinopril (PRINIVIL,ZESTRIL) 10 MG tablet  Take 1 tablet (10 mg total) by mouth 2 (two) times daily.  60 tablet  6  . SODIUM CITRATE PO Take 1 tablet by mouth 2 (two) times daily.       Marland Kitchen DISCONTD: furosemide (LASIX) 20 MG tablet Take 1 tablet (20 mg total) by mouth daily as needed (If weight increases 3 pounds in 24 hours).  30 tablet  6  . DISCONTD: spironolactone (ALDACTONE) 25 MG tablet Take 1 tablet (25 mg total) by mouth daily.  30 tablet  6     PHYSICAL EXAM: Filed Vitals:   11/08/11 0921  BP: 122/82  Pulse: 75  Height: 5\' 4"  (1.626 m)  Weight: 143 lb 1.9 oz (64.919 kg)  SpO2: 98%   General:  Well appearing.  No resp difficulty HEENT: normal Neck: supple. JVP flat. Carotids 2+ bilaterally; no bruits. No lymphadenopathy or thryomegaly appreciated. Cor: PMI normal. Regular rate & rhythm. No S3.  Trivial TR murmur.   Lungs: clear Abdomen: soft. nontender, nondistended. No hepatosplenomegaly. No bruits or masses.  Extremities: no cyanosis, clubbing, rash, edema Neuro: alert & orientedx3, cranial nerves grossly intact. Moves all 4 extremities w/o difficulty. Affect pleasant.    ASSESSMENT & PLAN:

## 2011-11-08 NOTE — Patient Instructions (Addendum)
Your physician recommends that you schedule a follow-up appointment in: 1 month with Dr Bensimhon  

## 2011-11-08 NOTE — Progress Notes (Signed)
  Echocardiogram 2D Echocardiogram has been performed.  Kathy Jimenez 11/08/2011, 9:12 AM

## 2011-11-09 ENCOUNTER — Telehealth: Payer: Self-pay | Admitting: *Deleted

## 2011-11-09 NOTE — Telephone Encounter (Signed)
Sent michelle email to set up patient for 11-17-2011

## 2011-11-10 ENCOUNTER — Other Ambulatory Visit: Payer: Self-pay | Admitting: Oncology

## 2011-11-10 ENCOUNTER — Ambulatory Visit (HOSPITAL_BASED_OUTPATIENT_CLINIC_OR_DEPARTMENT_OTHER): Payer: BC Managed Care – PPO

## 2011-11-10 ENCOUNTER — Other Ambulatory Visit (HOSPITAL_BASED_OUTPATIENT_CLINIC_OR_DEPARTMENT_OTHER): Payer: BC Managed Care – PPO | Admitting: Lab

## 2011-11-10 VITALS — BP 102/70 | HR 83 | Temp 97.4°F | Resp 18

## 2011-11-10 DIAGNOSIS — C7951 Secondary malignant neoplasm of bone: Secondary | ICD-10-CM

## 2011-11-10 DIAGNOSIS — C7952 Secondary malignant neoplasm of bone marrow: Secondary | ICD-10-CM

## 2011-11-10 DIAGNOSIS — C801 Malignant (primary) neoplasm, unspecified: Secondary | ICD-10-CM

## 2011-11-10 DIAGNOSIS — C787 Secondary malignant neoplasm of liver and intrahepatic bile duct: Secondary | ICD-10-CM

## 2011-11-10 DIAGNOSIS — C50919 Malignant neoplasm of unspecified site of unspecified female breast: Secondary | ICD-10-CM

## 2011-11-10 DIAGNOSIS — Z5111 Encounter for antineoplastic chemotherapy: Secondary | ICD-10-CM

## 2011-11-10 LAB — CBC WITH DIFFERENTIAL/PLATELET
Eosinophils Absolute: 0.4 10*3/uL (ref 0.0–0.5)
HCT: 36 % (ref 34.8–46.6)
LYMPH%: 29.1 % (ref 14.0–49.7)
MCHC: 35.6 g/dL (ref 31.5–36.0)
MCV: 85.7 fL (ref 79.5–101.0)
MONO%: 13.1 % (ref 0.0–14.0)
NEUT#: 4.5 10*3/uL (ref 1.5–6.5)
NEUT%: 53 % (ref 38.4–76.8)
Platelets: 194 10*3/uL (ref 145–400)
RBC: 4.2 10*6/uL (ref 3.70–5.45)

## 2011-11-10 LAB — COMPREHENSIVE METABOLIC PANEL
Alkaline Phosphatase: 82 U/L (ref 39–117)
BUN: 16 mg/dL (ref 6–23)
Creatinine, Ser: 0.9 mg/dL (ref 0.50–1.10)
Glucose, Bld: 87 mg/dL (ref 70–99)
Sodium: 140 mEq/L (ref 135–145)
Total Bilirubin: 0.5 mg/dL (ref 0.3–1.2)
Total Protein: 6.8 g/dL (ref 6.0–8.3)

## 2011-11-10 LAB — LACTATE DEHYDROGENASE: LDH: 557 U/L — ABNORMAL HIGH (ref 94–250)

## 2011-11-10 MED ORDER — DIPHENHYDRAMINE HCL 25 MG PO CAPS: 50.0000 mg | ORAL_CAPSULE | Freq: Once | ORAL | Status: DC

## 2011-11-10 MED ORDER — SODIUM CHLORIDE 0.9 % IV SOLN
2.0000 mg/kg | Freq: Once | INTRAVENOUS | Status: AC
  Administered 2011-11-10: 126 mg via INTRAVENOUS
  Filled 2011-11-10: qty 6

## 2011-11-10 MED ORDER — SODIUM CHLORIDE 0.9 % IJ SOLN
10.0000 mL | INTRAMUSCULAR | Status: DC | PRN
  Administered 2011-11-10: 10 mL
  Filled 2011-11-10: qty 10

## 2011-11-10 MED ORDER — ACETAMINOPHEN 325 MG PO TABS
650.0000 mg | ORAL_TABLET | Freq: Once | ORAL | Status: AC
  Administered 2011-11-10: 650 mg via ORAL

## 2011-11-10 MED ORDER — HEPARIN SOD (PORK) LOCK FLUSH 100 UNIT/ML IV SOLN
500.0000 [IU] | Freq: Once | INTRAVENOUS | Status: AC | PRN
  Administered 2011-11-10: 500 [IU]
  Filled 2011-11-10: qty 5

## 2011-11-10 MED ORDER — FLUOROURACIL CHEMO INJECTION 2.5 GM/50ML
600.0000 mg/m2 | Freq: Once | INTRAVENOUS | Status: AC
  Administered 2011-11-10: 1050 mg via INTRAVENOUS
  Filled 2011-11-10: qty 21

## 2011-11-10 MED ORDER — SODIUM CHLORIDE 0.9 % IV SOLN
Freq: Once | INTRAVENOUS | Status: AC
  Administered 2011-11-10: 15:00:00 via INTRAVENOUS

## 2011-11-10 MED ORDER — SODIUM CHLORIDE 0.9 % IV SOLN
600.0000 mg/m2 | Freq: Once | INTRAVENOUS | Status: AC
  Administered 2011-11-10: 1020 mg via INTRAVENOUS
  Filled 2011-11-10: qty 51

## 2011-11-10 MED ORDER — ONDANSETRON 8 MG/50ML IVPB (CHCC)
8.0000 mg | Freq: Once | INTRAVENOUS | Status: AC
  Administered 2011-11-10: 8 mg via INTRAVENOUS

## 2011-11-10 MED ORDER — METHOTREXATE SODIUM CHEMO INJECTION 25 MG/ML
40.0000 mg/m2 | Freq: Once | INTRAMUSCULAR | Status: AC
  Administered 2011-11-10: 67.5 mg via INTRAVENOUS
  Filled 2011-11-10: qty 2.7

## 2011-11-10 MED ORDER — SODIUM CHLORIDE 0.9 % IV SOLN: Freq: Once | INTRAVENOUS | Status: DC

## 2011-11-10 MED ORDER — DEXAMETHASONE SODIUM PHOSPHATE 10 MG/ML IJ SOLN
10.0000 mg | Freq: Once | INTRAMUSCULAR | Status: AC
  Administered 2011-11-10: 10 mg via INTRAVENOUS

## 2011-11-10 MED FILL — Perflutren Lipid Microsphere IV Susp 6.52 MG/ML: INTRAVENOUS | Qty: 2 | Status: AC

## 2011-11-10 NOTE — Patient Instructions (Signed)
[facility] Discharge Instructions for Patients Receiving Chemotherapy  Today you received the following chemotherapy agents Herceptin/Cytoxan/5 FU/Methotrexate To help prevent nausea and vomiting after your treatment, we encourage you to take your nausea medication as prescribed. If you develop nausea and vomiting that is not controlled by your nausea medication, call the clinic. If it is after clinic hours your family physician or the after hours number for the clinic or go to the Emergency Department.   BELOW ARE SYMPTOMS THAT SHOULD BE REPORTED IMMEDIATELY:  *FEVER GREATER THAN 100.5 F  *CHILLS WITH OR WITHOUT FEVER  NAUSEA AND VOMITING THAT IS NOT CONTROLLED WITH YOUR NAUSEA MEDICATION  *UNUSUAL SHORTNESS OF BREATH  *UNUSUAL BRUISING OR BLEEDING  TENDERNESS IN MOUTH AND THROAT WITH OR WITHOUT PRESENCE OF ULCERS  *URINARY PROBLEMS  *BOWEL PROBLEMS  UNUSUAL RASH Items with * indicate a potential emergency and should be followed up as soon as possible.  One of the nurses will contact you 24 hours after your treatment. Please let the nurse know about any problems that you may have experienced. Feel free to call the clinic you have any questions or concerns. The clinic phone number is [phone].   I have been informed and understand all the instructions given to me. I know to contact the clinic, my physician, or go to the Emergency Department if any problems should occur. I do not have any questions at this time, but understand that I may call the clinic during office hours   should I have any questions or need assistance in obtaining follow up care.    __________________________________________  _____________  __________ Signature of Patient or Authorized Representative            Date                   Time    __________________________________________ Nurse's Signature

## 2011-11-13 ENCOUNTER — Telehealth: Payer: Self-pay | Admitting: Oncology

## 2011-11-13 NOTE — Telephone Encounter (Signed)
lmonvm for pt confirming appt for 8/9.

## 2011-11-17 ENCOUNTER — Ambulatory Visit: Payer: Self-pay | Admitting: Physician Assistant

## 2011-11-17 ENCOUNTER — Other Ambulatory Visit (HOSPITAL_BASED_OUTPATIENT_CLINIC_OR_DEPARTMENT_OTHER): Payer: BC Managed Care – PPO | Admitting: Lab

## 2011-11-17 ENCOUNTER — Other Ambulatory Visit: Payer: Self-pay | Admitting: Lab

## 2011-11-17 ENCOUNTER — Ambulatory Visit: Payer: Self-pay

## 2011-11-17 ENCOUNTER — Ambulatory Visit (HOSPITAL_BASED_OUTPATIENT_CLINIC_OR_DEPARTMENT_OTHER): Payer: BC Managed Care – PPO

## 2011-11-17 ENCOUNTER — Ambulatory Visit (HOSPITAL_BASED_OUTPATIENT_CLINIC_OR_DEPARTMENT_OTHER): Payer: BC Managed Care – PPO | Admitting: Oncology

## 2011-11-17 VITALS — BP 124/81 | HR 70 | Temp 98.4°F | Resp 20 | Ht 64.0 in | Wt 140.1 lb

## 2011-11-17 DIAGNOSIS — C801 Malignant (primary) neoplasm, unspecified: Secondary | ICD-10-CM

## 2011-11-17 DIAGNOSIS — C787 Secondary malignant neoplasm of liver and intrahepatic bile duct: Secondary | ICD-10-CM

## 2011-11-17 DIAGNOSIS — C7951 Secondary malignant neoplasm of bone: Secondary | ICD-10-CM

## 2011-11-17 DIAGNOSIS — R1900 Intra-abdominal and pelvic swelling, mass and lump, unspecified site: Secondary | ICD-10-CM

## 2011-11-17 DIAGNOSIS — C50919 Malignant neoplasm of unspecified site of unspecified female breast: Secondary | ICD-10-CM

## 2011-11-17 DIAGNOSIS — Z5112 Encounter for antineoplastic immunotherapy: Secondary | ICD-10-CM

## 2011-11-17 LAB — CBC WITH DIFFERENTIAL/PLATELET
Basophils Absolute: 0 10*3/uL (ref 0.0–0.1)
Eosinophils Absolute: 0.3 10*3/uL (ref 0.0–0.5)
HCT: 37.3 % (ref 34.8–46.6)
HGB: 13 g/dL (ref 11.6–15.9)
LYMPH%: 28.6 % (ref 14.0–49.7)
MONO#: 0.2 10*3/uL (ref 0.1–0.9)
NEUT#: 4.3 10*3/uL (ref 1.5–6.5)
NEUT%: 63.2 % (ref 38.4–76.8)
Platelets: 177 10*3/uL (ref 145–400)
WBC: 6.8 10*3/uL (ref 3.9–10.3)
lymph#: 1.9 10*3/uL (ref 0.9–3.3)

## 2011-11-17 LAB — COMPREHENSIVE METABOLIC PANEL
ALT: 22 U/L (ref 0–35)
CO2: 29 mEq/L (ref 19–32)
Calcium: 9.6 mg/dL (ref 8.4–10.5)
Chloride: 102 mEq/L (ref 96–112)
Creatinine, Ser: 1.01 mg/dL (ref 0.50–1.10)
Glucose, Bld: 90 mg/dL (ref 70–99)
Total Bilirubin: 0.4 mg/dL (ref 0.3–1.2)

## 2011-11-17 MED ORDER — SODIUM CHLORIDE 0.9 % IJ SOLN
10.0000 mL | INTRAMUSCULAR | Status: DC | PRN
Start: 2011-11-17 — End: 2011-11-17
  Administered 2011-11-17: 10 mL
  Filled 2011-11-17: qty 10

## 2011-11-17 MED ORDER — HEPARIN SOD (PORK) LOCK FLUSH 100 UNIT/ML IV SOLN
500.0000 [IU] | Freq: Once | INTRAVENOUS | Status: AC | PRN
  Administered 2011-11-17: 500 [IU]
  Filled 2011-11-17: qty 5

## 2011-11-17 MED ORDER — SODIUM CHLORIDE 0.9 % IV SOLN
Freq: Once | INTRAVENOUS | Status: AC
  Administered 2011-11-17: 17:00:00 via INTRAVENOUS

## 2011-11-17 MED ORDER — TRASTUZUMAB CHEMO INJECTION 440 MG
2.0000 mg/kg | Freq: Once | INTRAVENOUS | Status: AC
  Administered 2011-11-17: 126 mg via INTRAVENOUS
  Filled 2011-11-17: qty 6

## 2011-11-17 MED ORDER — ACETAMINOPHEN 325 MG PO TABS
650.0000 mg | ORAL_TABLET | Freq: Once | ORAL | Status: AC
  Administered 2011-11-17: 650 mg via ORAL

## 2011-11-17 MED ORDER — DIPHENHYDRAMINE HCL 25 MG PO CAPS: 50.0000 mg | ORAL_CAPSULE | Freq: Once | ORAL | Status: DC

## 2011-11-17 NOTE — Progress Notes (Signed)
Hematology and Oncology Follow Up Visit  Kathy Jimenez [id number] [birth year] 41 y.o. 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 Currently day 1 of cycle 7 of every 3 week Kadcyla. She also receives every 3 week Faslodex, Zoladex, and Xgeva. We recently image her abdomen because of rising tumor marker and LDH. She had a mass in her left lobe which look appear to be somewhat enlarged and so there has been a change in Kadcyla Has Been Discontinued She  Started  CMF with Herceptin Given Weekly  Interim History:   She tolerated the CMF. She has no complaints. She thinks that the palpable epigastric pain is little bit improved. She has not noticed any alopecia. She spent the [place] the beach. A detailed review of systems is otherwise noncontributory as noted below.  Review of Systems: Constitutional:  no weight loss, fever, night sweats and feels well Eyes: no complaints ENT: no complaints Cardiovascular: no chest pain or dyspnea on exertion Respiratory: no cough, shortness of breath, or wheezing Neurological: no TIA or stroke symptoms Dermatological: negative Gastrointestinal: no abdominal pain, change in bowel habits, or black or bloody stools Genito-Urinary: no dysuria, trouble voiding, or hematuria Hematological and Lymphatic: negative Breast: negative Musculoskeletal: negative Remaining ROS negative.   Medications:   I have reviewed the patient's current medications.  Current Outpatient Prescriptions  Medication Sig Dispense Refill  . carvedilol (COREG) 12.5 MG tablet Take 12.5 mg by mouth 2 (two) times daily with a meal.      . dexamethasone (DECADRON) 4 MG tablet Take 2 tablets by mouth daily starting the day after chemotherapy for 2 days. Take with food.  30 tablet  1  . digoxin (LANOXIN) 0.125 MG tablet Take 1 tablet (0.125 mg total) by mouth daily.   30 tablet  6  . lisinopril (PRINIVIL,ZESTRIL) 10 MG tablet Take 1 tablet (10 mg total) by mouth 2 (two) times daily.  60 tablet  6  . LORazepam (ATIVAN) 0.5 MG tablet Take 1 tablet (0.5 mg total) by mouth every 6 (six) hours as needed (Nausea or vomiting).  30 tablet  0  . ondansetron (ZOFRAN) 8 MG tablet Take 1 tablet two times a day starting the day after chemo for 2 days. Then take 1 tablet two times a day as needed for nausea or vomiting.  30 tablet  1  . prochlorperazine (COMPAZINE) 10 MG tablet Take 1 tablet (10 mg total) by mouth every 6 (six) hours as needed (Nausea or vomiting).  30 tablet  1  . prochlorperazine (COMPAZINE) 25 MG suppository Place 1 suppository (25 mg total) rectally every 12 (twelve) hours as needed for nausea.  12 suppository  3  . SODIUM CITRATE PO Take 1 tablet by mouth 2 (two) times daily.       Marland Kitchen DISCONTD: furosemide (LASIX) 20 MG tablet Take 1 tablet (20 mg total) by mouth daily as needed (If weight increases 3 pounds in 24 hours).  30 tablet  6  . DISCONTD: spironolactone (ALDACTONE) 25 MG tablet Take 1 tablet (25 mg total) by mouth daily.  30 tablet  6   No current facility-administered medications for this visit.   Facility-Administered Medications Ordered in Other Visits  Medication Dose Route Frequency Provider Last Rate Last Dose  . 0.9 %  sodium chloride infusion   Intravenous Once Pierce Crane, MD 20 mL/hr at 11/17/11 1651    .  acetaminophen (TYLENOL) tablet 650 mg  650 mg Oral Once Pierce Crane, MD   650 mg at 11/17/11 1651  . diphenhydrAMINE (BENADRYL) capsule 50 mg  50 mg Oral Once Pierce Crane, MD      . heparin lock flush 100 unit/mL  500 Units Intracatheter Once PRN Pierce Crane, MD      . sodium chloride 0.9 % injection 10 mL  10 mL Intracatheter PRN Pierce Crane, MD      . trastuzumab (HERCEPTIN) 126 mg in sodium chloride 0.9 % 250 mL chemo infusion  2 mg/kg (Treatment Plan Actual) Intravenous Once Pierce Crane, MD 256 mL/hr at 11/17/11 1703 126 mg at  11/17/11 1703    Allergies:  Allergies  Allergen Reactions  . Sulfa Antibiotics Hives    Physical Exam: Filed Vitals:   11/17/11 1544  BP: 124/81  Pulse: 70  Temp: 98.4 F (36.9 C)  Resp: 20    Body mass index is 24.05 kg/(m^2). Weight: 141 lbs. HEENT:  Sclerae anicteric, conjunctivae pink.  Oropharynx clear.  No mucositis or candidiasis.   Nodes:  No cervical, supraclavicular, or axillary lymphadenopathy palpated.   Lungs:  Clear to auscultation bilaterally.  No crackles, rhonchi, or wheezes.   Heart:  Regular rate and rhythm.   Abdomen:  Abdomen is soft, nontender, without evidence of hepatomegaly. Normal bowel sounds are present. No evidence of distention. Musculoskeletal:  No focal spinal tenderness to palpation.  Extremities:  Benign.  No peripheral edema or cyanosis.   Skin:  Benign.   Neuro:  Nonfocal, alert and oriented x 3.   Lab Results: Lab Results  Component Value Date   WBC 6.8 11/17/2011   HGB 13.0 11/17/2011   HCT 37.3 11/17/2011   MCV 88.4 11/17/2011   PLT 177 11/17/2011   NEUTROABS 4.3 11/17/2011     Chemistry      Component Value Date/Time   NA 138 11/17/2011 1535   NA 141 09/02/2010 1134   K 3.6 11/17/2011 1535   K 4.3 09/02/2010 1134   CL 102 11/17/2011 1535   CL 103 09/02/2010 1134   CO2 29 11/17/2011 1535   CO2 25 09/02/2010 1134   BUN 23 11/17/2011 1535   BUN 11 09/02/2010 1134   CREATININE 1.01 11/17/2011 1535   CREATININE 0.7 09/02/2010 1134      Component Value Date/Time   CALCIUM 9.6 11/17/2011 1535   CALCIUM 8.4 09/02/2010 1134   ALKPHOS 92 11/17/2011 1535   ALKPHOS 84 09/02/2010 1134   AST 36 11/17/2011 1535   AST 27 09/02/2010 1134   ALT 22 11/17/2011 1535   BILITOT 0.4 11/17/2011 1535   BILITOT 0.60 09/02/2010 1134      Lab Results  Component Value Date   LABCA2 98* 10/27/2011    Radiological Studies: 10/16/11 CT CHEST, ABDOMEN AND PELVIS WITH CONTRAST  Technique: Multidetector CT imaging of the chest, abdomen and  pelvis was performed following the  standard protocol during bolus  administration of intravenous contrast.  Contrast: OMNIPAQUE IOHEXOL 300 MG/ML SOLN  Comparison: Chest CT from 02/22/2011, abdomen CT from 06/15/2011  CT CHEST  Findings: No axillary or supraclavicular adenopathy. There is no  mediastinal or hilar adenopathy. No pericardial or pleural  effusion.  The lungs are clear. No pulmonary nodule or mass identified.  Review of the visualized osseous structures shows a metastatic  lesion within the L1 vertebra. This measures 1.3 cm, image 55,  unchanged from previous exam. There are sclerotic metastasis  involving the manubrium  and sternum which are similar to previous exam.  IMPRESSION:  1. No pulmonary nodule or metastatic adenopathy identified.  2. Similar appearance of the sclerotic bone metastasis  CT ABDOMEN AND PELVIS  Findings: The spleen is negative. The gallbladder is normal. No  biliary dilatation. Normal appearance of the pancreas. Multifocal  liver metastasis are again noted. The medial right hepatic lobe  lesion has resolved in the interval. Lesion within the lateral  segment of the left hepatic lobe measures 3.9 x 5.8 cm, image 44.  Unchanged from previous exam. Anterior right hepatic lobe lesion  measures 1.5 x 1.0 cm, image 50. Previously this measured 1.9 x  1.6 cm. The stomach appears normal. The small bowel loops have a normal course and caliber. The appendix is identified and appears normal. Normal appearance of the colon.  Both adrenal glands are normal. Left kidney contains several  punctate stones measuring approximately 2 mm. There is right-sided hydronephrosis and hydroureter. This appears increased from previous exam. Within the distal ureter there is a stone measuring 3 mm, image 96. New from previous exam. The urinary bladder appears normal. The uterus and adnexal structures are unremarkable.  No free fluid within the abdomen or pelvis. There is a  gastrohepatic ligament lymph node  which measures 0.9 cm.  Previously 0.6 cm. No additional enlarged or enlarging abdominal  or pelvic lymph nodes. Mixed sclerotic and lytic metastasis  involving the left pubic rami is unchanged in appearance from  previous exam, image 114.  IMPRESSION:  1. Overall improvement in liver metastases.  2. Borderline enlarged gastrohepatic ligament lymph node has  increased in size from previous exam.  3. Progressive right hydronephrosis secondary to distal right  ureteral calculus.  These results will be called to the ordering clinician or  representative by the Radiologist Assistant, and communication  documented in the PACS Dashboard.  Original Report Authenticated By: Rosealee Albee, M.D.     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 Currently day 7 of cycle 1 of every 3 week CMF. Due for week 2 of herceptin. She also receives every 3 week Faslodex, Zoladex, and Xgeva, for which she is due today.  2. CHF: followed closely by Dr. Gala Romney. Recent ECHO fairly stable per Cardiology note, upcoming follow up appointment on 11/07/11      Plan:  Kathy Jimenez will receive her week 2 Herceptin for cycle 1 CMF Herceptin, we'll see her again next week for Herceptin alone she seems to be tolerating the regimen. This plan was reviewed with the patient, who voices understanding and agreement.  She knows to call with any changes or problems.    Pierce Crane, MD 10/27/11

## 2011-11-20 ENCOUNTER — Telehealth: Payer: Self-pay | Admitting: *Deleted

## 2011-11-20 NOTE — Telephone Encounter (Signed)
Patient confirmed over the phone the new date and time on 11-29-2011 (daughter has orientation asked to move treatment for 11-29-2011)

## 2011-11-21 ENCOUNTER — Other Ambulatory Visit: Payer: Self-pay | Admitting: Oncology

## 2011-11-22 ENCOUNTER — Other Ambulatory Visit: Payer: Self-pay | Admitting: *Deleted

## 2011-11-22 NOTE — Telephone Encounter (Signed)
Per pt, she "plans on herceptin sch. 11/24/11 and her next appt for CMF with herceptin is scheduled 11/29/11. Per MD, "okay"

## 2011-11-24 ENCOUNTER — Ambulatory Visit (HOSPITAL_BASED_OUTPATIENT_CLINIC_OR_DEPARTMENT_OTHER): Payer: BC Managed Care – PPO

## 2011-11-24 VITALS — BP 119/78 | HR 76 | Temp 97.9°F | Resp 20

## 2011-11-24 DIAGNOSIS — C50919 Malignant neoplasm of unspecified site of unspecified female breast: Secondary | ICD-10-CM

## 2011-11-24 DIAGNOSIS — C801 Malignant (primary) neoplasm, unspecified: Secondary | ICD-10-CM

## 2011-11-24 DIAGNOSIS — C7952 Secondary malignant neoplasm of bone marrow: Secondary | ICD-10-CM

## 2011-11-24 DIAGNOSIS — Z5112 Encounter for antineoplastic immunotherapy: Secondary | ICD-10-CM

## 2011-11-24 DIAGNOSIS — C787 Secondary malignant neoplasm of liver and intrahepatic bile duct: Secondary | ICD-10-CM

## 2011-11-24 MED ORDER — ACETAMINOPHEN 325 MG PO TABS
650.0000 mg | ORAL_TABLET | Freq: Once | ORAL | Status: AC
  Administered 2011-11-24: 650 mg via ORAL

## 2011-11-24 MED ORDER — HEPARIN SOD (PORK) LOCK FLUSH 100 UNIT/ML IV SOLN
500.0000 [IU] | Freq: Once | INTRAVENOUS | Status: AC | PRN
  Administered 2011-11-24: 500 [IU]
  Filled 2011-11-24: qty 5

## 2011-11-24 MED ORDER — SODIUM CHLORIDE 0.9 % IJ SOLN
10.0000 mL | INTRAMUSCULAR | Status: DC | PRN
  Administered 2011-11-24: 10 mL
  Filled 2011-11-24: qty 10

## 2011-11-24 MED ORDER — TRASTUZUMAB CHEMO INJECTION 440 MG
2.0000 mg/kg | Freq: Once | INTRAVENOUS | Status: AC
  Administered 2011-11-24: 126 mg via INTRAVENOUS
  Filled 2011-11-24: qty 6

## 2011-11-24 MED ORDER — SODIUM CHLORIDE 0.9 % IV SOLN
Freq: Once | INTRAVENOUS | Status: AC
  Administered 2011-11-24: 16:00:00 via INTRAVENOUS

## 2011-11-24 MED ORDER — DENOSUMAB 120 MG/1.7ML ~~LOC~~ SOLN
120.0000 mg | Freq: Once | SUBCUTANEOUS | Status: AC
  Administered 2011-11-24: 120 mg via SUBCUTANEOUS
  Filled 2011-11-24: qty 1.7

## 2011-11-24 NOTE — Patient Instructions (Signed)
[facility] Discharge Instructions for Patients Receiving Chemotherapy  Today you received the following;  Herceptin and Xgeva.  To help prevent nausea and vomiting after your treatment, we encourage you to take your nausea medication. Begin taking nausea medication  often as prescribed for by Dr. Donnie Coffin.    If you develop nausea and vomiting that is not controlled by your nausea medication, call the clinic. If it is after clinic hours your family physician or the after hours number for the clinic or go to the Emergency Department.   BELOW ARE SYMPTOMS THAT SHOULD BE REPORTED IMMEDIATELY:  *FEVER GREATER THAN 100.5 F  *CHILLS WITH OR WITHOUT FEVER  NAUSEA AND VOMITING THAT IS NOT CONTROLLED WITH YOUR NAUSEA MEDICATION  *UNUSUAL SHORTNESS OF BREATH  *UNUSUAL BRUISING OR BLEEDING  TENDERNESS IN MOUTH AND THROAT WITH OR WITHOUT PRESENCE OF ULCERS  *URINARY PROBLEMS  *BOWEL PROBLEMS  UNUSUAL RASH Items with * indicate a potential emergency and should be followed up as soon as possible.  One of the nurses will contact you 24 hours after your treatment. Please let the nurse know about any problems that you may have experienced. Feel free to call the clinic you have any questions or concerns. The clinic phone number is [phone].   I have been informed and understand all the instructions given to me. I know to contact the clinic, my physician, or go to the Emergency Department if any problems should occur. I do not have any questions at this time, but understand that I may call the clinic during office hours   should I have any questions or need assistance in obtaining follow up care.    __________________________________________  _____________  __________ Signature of Patient or Authorized Representative            Date                   Time    __________________________________________ Nurse's Signature   DENOSUMAB slows bone breakdown. It is used to  treat osteoporosis in women after menopause. This medicine is also used to prevent bone fractures and other bone problems caused by cancer bone metastases. This medicine may be used for other purposes; ask your health care provider or pharmacist if you have questions. What should I tell my health care provider before I take this medicine? They need to know if you have any of these conditions: -dental disease -eczema -infection or history of infections -kidney disease or on dialysis -low blood calcium or vitamin D -malabsorption syndrome -scheduled to have surgery or tooth extraction -taking medicine that contains denosumab -thyroid or parathyroid disease -an unusual reaction to denosumab, other medicines, foods, dyes, or preservatives -pregnant or trying to get pregnant -breast-feeding How should I use this medicine? This medicine is for injection under the skin. It is given by a health care professional in a hospital or clinic setting. If you are getting Prolia, a special MedGuide will be given to you by the pharmacist with each prescription and refill. Be sure to read this information carefully each time. Talk to your pediatrician regarding the use of this medicine in children. Special care may be needed. Overdosage: If you think you've taken too much of this medicine contact a poison control center or emergency room at once. Overdosage: If you think you have taken too much of this medicine contact a poison control center or emergency room at once. NOTE: This medicine is only for you. Do not share this  medicine with others. What if I miss a dose? It is important not to miss your dose. Call your doctor or health care professional if you are unable to keep an appointment. What may interact with this medicine? Do not take this medicine with any of the following medications: -other medicines containing denosumab This medicine may also interact with the following medications: -medicines  that suppress the immune system -medicines that treat cancer -steroid medicines like prednisone or cortisone This list may not describe all possible interactions. Give your health care provider a list of all the medicines, herbs, non-prescription drugs, or dietary supplements you use. Also tell them if you smoke, drink alcohol, or use illegal drugs. Some items may interact with your medicine. What should I watch for while using this medicine? Visit your doctor or health care professional for regular checks on your progress. Your doctor or health care professional may order blood tests and other tests to see how you are doing. Call your doctor or health care professional if you get a cold or other infection while receiving this medicine. Do not treat yourself. This medicine may decrease your body's ability to fight infection. You should make sure you get enough calcium and vitamin D while you are taking this medicine, unless your doctor tells you not to. Discuss the foods you eat and the vitamins you take with your health care professional. See your dentist regularly. Brush and floss your teeth as directed. Before you have any dental work done, tell your dentist you are receiving this medicine. What side effects may I notice from receiving this medicine? Side effects that you should report to your doctor or health care professional as soon as possible: -allergic reactions like skin rash, itching or hives, swelling of the face, lips, or tongue -breathing problems -chest pain -fast, irregular heartbeat -feeling faint or lightheaded, falls -fever, chills, or any other sign of infection -muscle spasms, tightening, or twitches -numbness or tingling -skin blisters or bumps, or is dry, peels, or red -slow healing or unexplained pain in the mouth or jaw -unusual bleeding or bruising Side effects that usually do not require medical attention (Report these to your doctor or health care professional if  they continue or are bothersome.): -muscle pain -stomach upset, gas This list may not describe all possible side effects. Call your doctor for medical advice about side effects. You may report side effects to FDA at 1-800-FDA-1088. Where should I keep my medicine? This medicine is only given in a clinic, doctor's office, or other health care setting and will not be stored at home. NOTE: This sheet is a summary. It may not cover all possible information. If you have questions about this medicine, talk to your doctor, pharmacist, or health care provider.  2012, Elsevier/Gold Standard. (11/17/2009 2:32:46 PM)

## 2011-11-29 ENCOUNTER — Ambulatory Visit: Payer: Self-pay

## 2011-11-29 ENCOUNTER — Ambulatory Visit: Payer: Self-pay | Admitting: Oncology

## 2011-11-29 ENCOUNTER — Other Ambulatory Visit: Payer: Self-pay | Admitting: Lab

## 2011-12-01 ENCOUNTER — Ambulatory Visit (HOSPITAL_BASED_OUTPATIENT_CLINIC_OR_DEPARTMENT_OTHER): Payer: BC Managed Care – PPO

## 2011-12-01 ENCOUNTER — Ambulatory Visit: Payer: Self-pay | Admitting: Oncology

## 2011-12-01 ENCOUNTER — Other Ambulatory Visit: Payer: Self-pay | Admitting: *Deleted

## 2011-12-01 ENCOUNTER — Ambulatory Visit (HOSPITAL_BASED_OUTPATIENT_CLINIC_OR_DEPARTMENT_OTHER): Payer: Self-pay | Admitting: Lab

## 2011-12-01 VITALS — BP 123/79 | HR 73 | Temp 97.6°F | Resp 20

## 2011-12-01 DIAGNOSIS — C801 Malignant (primary) neoplasm, unspecified: Secondary | ICD-10-CM

## 2011-12-01 DIAGNOSIS — Z5111 Encounter for antineoplastic chemotherapy: Secondary | ICD-10-CM

## 2011-12-01 DIAGNOSIS — C50919 Malignant neoplasm of unspecified site of unspecified female breast: Secondary | ICD-10-CM

## 2011-12-01 DIAGNOSIS — C7951 Secondary malignant neoplasm of bone: Secondary | ICD-10-CM

## 2011-12-01 DIAGNOSIS — C7952 Secondary malignant neoplasm of bone marrow: Secondary | ICD-10-CM

## 2011-12-01 DIAGNOSIS — C787 Secondary malignant neoplasm of liver and intrahepatic bile duct: Secondary | ICD-10-CM

## 2011-12-01 LAB — CBC WITH DIFFERENTIAL/PLATELET
BASO%: 0.5 % (ref 0.0–2.0)
Basophils Absolute: 0 10*3/uL (ref 0.0–0.1)
EOS%: 1.9 % (ref 0.0–7.0)
HCT: 34.6 % — ABNORMAL LOW (ref 34.8–46.6)
HGB: 11.9 g/dL (ref 11.6–15.9)
MCH: 31 pg (ref 25.1–34.0)
MCHC: 34.3 g/dL (ref 31.5–36.0)
MONO#: 1.1 10*3/uL — ABNORMAL HIGH (ref 0.1–0.9)
NEUT%: 60.2 % (ref 38.4–76.8)
RDW: 14.4 % (ref 11.2–14.5)
WBC: 8.3 10*3/uL (ref 3.9–10.3)
lymph#: 2 10*3/uL (ref 0.9–3.3)

## 2011-12-01 LAB — CANCER ANTIGEN 27.29: CA 27.29: 93 U/mL — ABNORMAL HIGH (ref 0–39)

## 2011-12-01 LAB — LACTATE DEHYDROGENASE: LDH: 542 U/L — ABNORMAL HIGH (ref 94–250)

## 2011-12-01 LAB — COMPREHENSIVE METABOLIC PANEL
ALT: 37 U/L — ABNORMAL HIGH (ref 0–35)
AST: 56 U/L — ABNORMAL HIGH (ref 0–37)
Albumin: 3.7 g/dL (ref 3.5–5.2)
CO2: 27 mEq/L (ref 19–32)
Calcium: 9.8 mg/dL (ref 8.4–10.5)
Chloride: 102 mEq/L (ref 96–112)
Potassium: 3.8 mEq/L (ref 3.5–5.3)
Total Protein: 7.4 g/dL (ref 6.0–8.3)

## 2011-12-01 MED ORDER — DEXAMETHASONE SODIUM PHOSPHATE 10 MG/ML IJ SOLN
10.0000 mg | Freq: Once | INTRAMUSCULAR | Status: AC
  Administered 2011-12-01: 5 mg via INTRAVENOUS

## 2011-12-01 MED ORDER — SODIUM CHLORIDE 0.9 % IV SOLN
Freq: Once | INTRAVENOUS | Status: AC
  Administered 2011-12-01: 16:00:00 via INTRAVENOUS

## 2011-12-01 MED ORDER — ONDANSETRON 8 MG/50ML IVPB (CHCC)
8.0000 mg | Freq: Once | INTRAVENOUS | Status: AC
  Administered 2011-12-01: 8 mg via INTRAVENOUS

## 2011-12-01 MED ORDER — SODIUM CHLORIDE 0.9 % IV SOLN
600.0000 mg/m2 | Freq: Once | INTRAVENOUS | Status: AC
  Administered 2011-12-01: 1020 mg via INTRAVENOUS
  Filled 2011-12-01: qty 51

## 2011-12-01 MED ORDER — SODIUM CHLORIDE 0.9 % IJ SOLN
10.0000 mL | INTRAMUSCULAR | Status: DC | PRN
  Filled 2011-12-01: qty 10

## 2011-12-01 MED ORDER — FLUOROURACIL CHEMO INJECTION 2.5 GM/50ML
600.0000 mg/m2 | Freq: Once | INTRAVENOUS | Status: AC
  Administered 2011-12-01: 1050 mg via INTRAVENOUS
  Filled 2011-12-01: qty 21

## 2011-12-01 MED ORDER — GOSERELIN ACETATE 3.6 MG ~~LOC~~ IMPL
3.6000 mg | DRUG_IMPLANT | Freq: Once | SUBCUTANEOUS | Status: AC
  Administered 2011-12-01: 3.6 mg via SUBCUTANEOUS
  Filled 2011-12-01: qty 3.6

## 2011-12-01 MED ORDER — HEPARIN SOD (PORK) LOCK FLUSH 100 UNIT/ML IV SOLN
500.0000 [IU] | Freq: Once | INTRAVENOUS | Status: DC | PRN
  Filled 2011-12-01: qty 5

## 2011-12-01 MED ORDER — TRASTUZUMAB CHEMO INJECTION 440 MG
2.0000 mg/kg | Freq: Once | INTRAVENOUS | Status: AC
  Administered 2011-12-01: 126 mg via INTRAVENOUS
  Filled 2011-12-01: qty 6

## 2011-12-01 MED ORDER — METHOTREXATE SODIUM CHEMO INJECTION 25 MG/ML
40.0000 mg/m2 | Freq: Once | INTRAMUSCULAR | Status: AC
  Administered 2011-12-01: 67.5 mg via INTRAVENOUS
  Filled 2011-12-01: qty 2.7

## 2011-12-01 MED ORDER — ACETAMINOPHEN 325 MG PO TABS
650.0000 mg | ORAL_TABLET | Freq: Once | ORAL | Status: AC
Start: 2011-12-01 — End: 2011-12-01
  Administered 2011-12-01: 650 mg via ORAL

## 2011-12-01 MED ORDER — DIPHENHYDRAMINE HCL 25 MG PO CAPS: 50.0000 mg | ORAL_CAPSULE | Freq: Once | ORAL | Status: DC

## 2011-12-01 NOTE — Patient Instructions (Signed)
[facility] Discharge Instructions for Patients Receiving Chemotherapy  Today you received the following chemotherapy agents Herceptin, Cytoxan, Methotrexate and Adrucil.  To help prevent nausea and vomiting after your treatment, we encourage you to take your nausea medication. Begin taking your nausea medication as often as prescribed for by Dr. Donnie Coffin.    If you develop nausea and vomiting that is not controlled by your nausea medication, call the clinic. If it is after clinic hours your family physician or the after hours number for the clinic or go to the Emergency Department.   BELOW ARE SYMPTOMS THAT SHOULD BE REPORTED IMMEDIATELY:  *FEVER GREATER THAN 100.5 F  *CHILLS WITH OR WITHOUT FEVER  NAUSEA AND VOMITING THAT IS NOT CONTROLLED WITH YOUR NAUSEA MEDICATION  *UNUSUAL SHORTNESS OF BREATH  *UNUSUAL BRUISING OR BLEEDING  TENDERNESS IN MOUTH AND THROAT WITH OR WITHOUT PRESENCE OF ULCERS  *URINARY PROBLEMS  *BOWEL PROBLEMS  UNUSUAL RASH Items with * indicate a potential emergency and should be followed up as soon as possible.  One of the nurses will contact you 24 hours after your treatment. Please let the nurse know about any problems that you may have experienced. Feel free to call the clinic you have any questions or concerns. The clinic phone number is [phone].   I have been informed and understand all the instructions given to me. I know to contact the clinic, my physician, or go to the Emergency Department if any problems should occur. I do not have any questions at this time, but understand that I may call the clinic during office hours   should I have any questions or need assistance in obtaining follow up care.    __________________________________________  _____________  __________ Signature of Patient or Authorized Representative            Date                   Time    __________________________________________ Nurse's  Signature    GOSERELIN (GOE se rel in) is similar to a hormone found in the body. It lowers the amount of sex hormones that the body makes. Men will have lower testosterone levels and women will have lower estrogen levels while taking this medicine. In men, this medicine is used to treat prostate cancer; the injection is either given once per month or once every 12 weeks. A once per month injection (only) is used to treat women with endometriosis, dysfunctional uterine bleeding, or advanced breast cancer. This medicine may be used for other purposes; ask your health care provider or pharmacist if you have questions. What should I tell my health care provider before I take this medicine? They need to know if you have any of these conditions (some only apply to women): -diabetes -heart disease or previous heart attack -high blood pressure -high cholesterol -kidney disease -osteoporosis or low bone density -problems passing urine -spinal cord injury -stroke -tobacco smoker -an unusual or allergic reaction to goserelin, hormone therapy, other medicines, foods, dyes, or preservatives -pregnant or trying to get pregnant -breast-feeding How should I use this medicine? This medicine is for injection under the skin. It is given by a health care professional in a hospital or clinic setting. Men receive this injection once every 4 weeks or once every 12 weeks. Women will only receive the once every 4 weeks injection. Talk to your pediatrician regarding the use of this medicine in children. Special care may be needed. Overdosage: If you  think you have taken too much of this medicine contact a poison control center or emergency room at once. NOTE: This medicine is only for you. Do not share this medicine with others. What if I miss a dose? It is important not to miss your dose. Call your doctor or health care professional if you are unable to keep an appointment. What may interact with this  medicine? -female hormones like estrogen -herbal or dietary supplements like black cohosh, chasteberry, or DHEA -female hormones like testosterone -prasterone This list may not describe all possible interactions. Give your health care provider a list of all the medicines, herbs, non-prescription drugs, or dietary supplements you use. Also tell them if you smoke, drink alcohol, or use illegal drugs. Some items may interact with your medicine. What should I watch for while using this medicine? Visit your doctor or health care professional for regular checks on your progress. Your symptoms may appear to get worse during the first weeks of this therapy. Tell your doctor or healthcare professional if your symptoms do not start to get better or if they get worse after this time. Your bones may get weaker if you take this medicine for a long time. If you smoke or frequently drink alcohol you may increase your risk of bone loss. A family history of osteoporosis, chronic use of drugs for seizures (convulsions), or corticosteroids can also increase your risk of bone loss. Talk to your doctor about how to keep your bones strong. This medicine should stop regular monthly menstration in women. Tell your doctor if you continue to [facility]. Women should not become pregnant while taking this medicine or for 12 weeks after stopping this medicine. Women should inform their doctor if they wish to become pregnant or think they might be pregnant. There is a potential for serious side effects to an unborn child. Talk to your health care professional or pharmacist for more information. Do not breast-feed an infant while taking this medicine. Men should inform their doctors if they wish to father a child. This medicine may lower sperm counts. Talk to your health care professional or pharmacist for more information. What side effects may I notice from receiving this medicine? Side effects that you should report to your doctor or  health care professional as soon as possible: -allergic reactions like skin rash, itching or hives, swelling of the face, lips, or tongue -bone pain -breathing problems -changes in vision -chest pain -feeling faint or lightheaded, falls -fever, chills -pain, swelling, warmth in the leg -pain, tingling, numbness in the hands or feet -swelling of the ankles, feet, hands -trouble passing urine or change in the amount of urine -unusually high or low blood pressure -unusually weak or tired Side effects that usually do not require medical attention (report to your doctor or health care professional if they continue or are bothersome): -change in sex drive or performance -changes in breast size in both males and females -changes in emotions or moods -headache -hot flashes -irritation at site where injected -loss of appetite -skin problems like acne, dry skin -vaginal dryness This list may not describe all possible side effects. Call your doctor for medical advice about side effects. You may report side effects to FDA at 1-800-FDA-1088. Where should I keep my medicine? This drug is given in a hospital or clinic and will not be stored at home. NOTE: This sheet is a summary. It may not cover all possible information. If you have questions about this medicine, talk to your  doctor, pharmacist, or health care provider.  2012, Elsevier/Gold Standard. (08/11/2008 1:28:29 PM)

## 2011-12-03 ENCOUNTER — Encounter: Payer: Self-pay | Admitting: Oncology

## 2011-12-04 ENCOUNTER — Other Ambulatory Visit: Payer: Self-pay | Admitting: Certified Registered Nurse Anesthetist

## 2011-12-06 ENCOUNTER — Other Ambulatory Visit: Payer: Self-pay | Admitting: Oncology

## 2011-12-07 ENCOUNTER — Telehealth: Payer: Self-pay | Admitting: *Deleted

## 2011-12-07 NOTE — Telephone Encounter (Signed)
Patient confirmed over the phone 12-08-2010 starting 2:30pm

## 2011-12-08 ENCOUNTER — Encounter (HOSPITAL_COMMUNITY): Payer: Self-pay

## 2011-12-08 ENCOUNTER — Ambulatory Visit (HOSPITAL_COMMUNITY)
Admission: RE | Admit: 2011-12-08 | Discharge: 2011-12-08 | Disposition: A | Discharge status: Home / Self Care | Payer: BC Managed Care – PPO | Source: Ambulatory Visit | Attending: Internal Medicine | Admitting: Internal Medicine

## 2011-12-08 ENCOUNTER — Ambulatory Visit (HOSPITAL_BASED_OUTPATIENT_CLINIC_OR_DEPARTMENT_OTHER): Payer: BC Managed Care – PPO

## 2011-12-08 ENCOUNTER — Other Ambulatory Visit (HOSPITAL_BASED_OUTPATIENT_CLINIC_OR_DEPARTMENT_OTHER): Payer: BC Managed Care – PPO | Admitting: Lab

## 2011-12-08 VITALS — BP 103/64 | HR 74 | Ht 64.0 in | Wt 141.8 lb

## 2011-12-08 VITALS — BP 125/84 | HR 97 | Temp 98.2°F

## 2011-12-08 DIAGNOSIS — Z5112 Encounter for antineoplastic immunotherapy: Secondary | ICD-10-CM

## 2011-12-08 DIAGNOSIS — C787 Secondary malignant neoplasm of liver and intrahepatic bile duct: Secondary | ICD-10-CM

## 2011-12-08 DIAGNOSIS — C50919 Malignant neoplasm of unspecified site of unspecified female breast: Secondary | ICD-10-CM

## 2011-12-08 DIAGNOSIS — C801 Malignant (primary) neoplasm, unspecified: Secondary | ICD-10-CM

## 2011-12-08 DIAGNOSIS — I5022 Chronic systolic (congestive) heart failure: Secondary | ICD-10-CM

## 2011-12-08 DIAGNOSIS — C7951 Secondary malignant neoplasm of bone: Secondary | ICD-10-CM

## 2011-12-08 LAB — COMPREHENSIVE METABOLIC PANEL (CC13)
ALT: 21 U/L (ref 0–55)
AST: 34 U/L (ref 5–34)
Alkaline Phosphatase: 96 U/L (ref 40–150)
BUN: 17 mg/dL (ref 7.0–26.0)
Chloride: 106 mEq/L (ref 98–107)
Creatinine: 1 mg/dL (ref 0.6–1.1)
Total Bilirubin: 0.7 mg/dL (ref 0.20–1.20)

## 2011-12-08 LAB — CBC WITH DIFFERENTIAL/PLATELET
BASO%: 1 % (ref 0.0–2.0)
Basophils Absolute: 0.1 10*3/uL (ref 0.0–0.1)
EOS%: 2.1 % (ref 0.0–7.0)
HCT: 36.2 % (ref 34.8–46.6)
HGB: 12.5 g/dL (ref 11.6–15.9)
MCH: 31.4 pg (ref 25.1–34.0)
MCHC: 34.6 g/dL (ref 31.5–36.0)
MCV: 90.7 fL (ref 79.5–101.0)
MONO%: 2.4 % (ref 0.0–14.0)
NEUT%: 66.4 % (ref 38.4–76.8)
lymph#: 1.5 10*3/uL (ref 0.9–3.3)

## 2011-12-08 MED ORDER — SODIUM CHLORIDE 0.9 % IV SOLN
Freq: Once | INTRAVENOUS | Status: AC
  Administered 2011-12-08: 16:00:00 via INTRAVENOUS

## 2011-12-08 MED ORDER — TRASTUZUMAB CHEMO INJECTION 440 MG
2.0000 mg/kg | Freq: Once | INTRAVENOUS | Status: AC
  Administered 2011-12-08: 126 mg via INTRAVENOUS
  Filled 2011-12-08: qty 6

## 2011-12-08 MED ORDER — ACETAMINOPHEN 325 MG PO TABS
650.0000 mg | ORAL_TABLET | Freq: Once | ORAL | Status: AC
  Administered 2011-12-08: 650 mg via ORAL

## 2011-12-08 MED ORDER — HEPARIN SOD (PORK) LOCK FLUSH 100 UNIT/ML IV SOLN
500.0000 [IU] | Freq: Once | INTRAVENOUS | Status: AC | PRN
  Administered 2011-12-08: 500 [IU]
  Filled 2011-12-08: qty 5

## 2011-12-08 MED ORDER — SODIUM CHLORIDE 0.9 % IJ SOLN
10.0000 mL | INTRAMUSCULAR | Status: DC | PRN
  Administered 2011-12-08: 10 mL
  Filled 2011-12-08: qty 10

## 2011-12-08 MED ORDER — DIPHENHYDRAMINE HCL 25 MG PO CAPS: 50.0000 mg | ORAL_CAPSULE | Freq: Once | ORAL | Status: DC

## 2011-12-08 NOTE — Patient Instructions (Addendum)
Follow up the end of September with Dr Gala Romney with an ECHO

## 2011-12-08 NOTE — Assessment & Plan Note (Addendum)
Remains stable. Continue current regimen. Repeat ECHO the end of next month.     Patient seen and examined with Tonye Becket, NP. We discussed all aspects of the encounter. I agree with the assessment and plan as stated above.  She is tolerating Herceptin well. No clinical evidence of HF. Will continue to watch EF closely.

## 2011-12-08 NOTE — Assessment & Plan Note (Deleted)
Follow serial ECHOs while on herceptin due to potential cardiotoxicity. Follow the end of next month.

## 2011-12-08 NOTE — Progress Notes (Signed)
Patient ID: RUARI DUGGAN, female   DOB: 1970/12/11, 41 y.o.   MRN: [id number] HPI:  Mrs. Mailloux is a 41 y.o. female with L breast CA in 2004 s/p lumpectomy and treatment with FEC (flourouracil, epirubicin, cyclophosphamide). Also treated with hormonal therapy. 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  Started on weekly herceptin. Recently also started on Perjeta and Tykerb. Dr. Donnie Coffin evaluated her for a cough and progressive DOE; CT which showed dilated heart with pleural effusion and echo with EF 25% with global HK. Mod MR/TR, lateral s' 6.1 sec/m. Admitted to the hospital for volume overload and tachycardia.  Diuresed 9 lbs, discharge weight 151 lbs.  With history of severe kidney stones, lasix 20 mg given prn.    ECHOS: 03/23/11: EF 25-30%, lateral s' 6.4 sec/m, LVIDd improved from 4.9 to 3.4.  05/24/11: EF 45-50%, diffuse hypokinesis 06/16/11: EF 60-65% 08/17/11: EF 50%  11/08/11: EF 60-65%  She is here for routine follow up today. She has resumed Herceptin every week and every 3rd week she adds CMF. Tumor markers down 5 . Denies SOB/PND/CP/Orthopnea. Denies lower extremity edema.     ROS: All systems negative except as listed in HPI, PMH and Problem List.  Past Medical History  Diagnosis Date  . 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 Kidney stones     recurrent since 1989  . Endometrial polyp   . PONV (postoperative nausea and vomiting)   . CHF (congestive heart failure)     from chemo treatment    Current Outpatient Prescriptions  Medication Sig Dispense Refill  . carvedilol (COREG) 12.5 MG tablet Take 12.5 mg by mouth 2 (two) times daily with a meal.      . dexamethasone (DECADRON) 4 MG tablet Take 2 tablets by mouth daily starting the day after chemotherapy for 2 days. Take with food.  30 tablet  1  . digoxin (LANOXIN) 0.125 MG tablet Take 1 tablet  (0.125 mg total) by mouth daily.  30 tablet  6  . lisinopril (PRINIVIL,ZESTRIL) 10 MG tablet Take 1 tablet (10 mg total) by mouth 2 (two) times daily.  60 tablet  6  . LORazepam (ATIVAN) 0.5 MG tablet Take 1 tablet (0.5 mg total) by mouth every 6 (six) hours as needed (Nausea or vomiting).  30 tablet  0  . ondansetron (ZOFRAN) 8 MG tablet Take 1 tablet two times a day starting the day after chemo for 2 days. Then take 1 tablet two times a day as needed for nausea or vomiting.  30 tablet  1  . prochlorperazine (COMPAZINE) 10 MG tablet Take 1 tablet (10 mg total) by mouth every 6 (six) hours as needed (Nausea or vomiting).  30 tablet  1  . prochlorperazine (COMPAZINE) 25 MG suppository Place 1 suppository (25 mg total) rectally every 12 (twelve) hours as needed for nausea.  12 suppository  3  . SODIUM CITRATE PO Take 1 tablet by mouth 2 (two) times daily.       Marland Kitchen DISCONTD: furosemide (LASIX) 20 MG tablet Take 1 tablet (20 mg total) by mouth daily as needed (If weight increases 3 pounds in 24 hours).  30 tablet  6  . DISCONTD: spironolactone (ALDACTONE) 25 MG tablet Take 1 tablet (25 mg total) by mouth daily.  30 tablet  6     PHYSICAL EXAM: Filed Vitals:  12/08/11 0958  BP: 103/64  Pulse: 74  Height: 5\' 4"  (1.626 m)  Weight: 141 lb 12.8 oz (64.32 kg)  SpO2: 100%   General:  Well appearing.  No resp difficulty HEENT: normal Neck: supple. JVP flat. Carotids 2+ bilaterally; no bruits. No lymphadenopathy or thryomegaly appreciated. Cor: PMI normal. Regular rate & rhythm. No S3.  Trivial TR murmur.   Lungs: clear Abdomen: soft. nontender, nondistended. No hepatosplenomegaly. No bruits or masses.  Extremities: no cyanosis, clubbing, rash, edema Neuro: alert & orientedx3, cranial nerves grossly intact. Moves all 4 extremities w/o difficulty. Affect pleasant.    ASSESSMENT & PLAN:

## 2011-12-08 NOTE — Patient Instructions (Addendum)
[facility] Discharge Instructions for Patients Receiving Chemotherapy  Today you received the following chemotherapy agents Herceptin To help prevent nausea and vomiting after your treatment, we encourage you to take your nausea medication as prescribed.  If you develop nausea and vomiting that is not controlled by your nausea medication, call the clinic. If it is after clinic hours your family physician or the after hours number for the clinic or go to the Emergency Department.   BELOW ARE SYMPTOMS THAT SHOULD BE REPORTED IMMEDIATELY:  *FEVER GREATER THAN 100.5 F  *CHILLS WITH OR WITHOUT FEVER  NAUSEA AND VOMITING THAT IS NOT CONTROLLED WITH YOUR NAUSEA MEDICATION  *UNUSUAL SHORTNESS OF BREATH  *UNUSUAL BRUISING OR BLEEDING  TENDERNESS IN MOUTH AND THROAT WITH OR WITHOUT PRESENCE OF ULCERS  *URINARY PROBLEMS  *BOWEL PROBLEMS  UNUSUAL RASH Items with * indicate a potential emergency and should be followed up as soon as possible.  One of the nurses will contact you 24 hours after your treatment. Please let the nurse know about any problems that you may have experienced. Feel free to call the clinic you have any questions or concerns. The clinic phone number is [phone].   I have been informed and understand all the instructions given to me. I know to contact the clinic, my physician, or go to the Emergency Department if any problems should occur. I do not have any questions at this time, but understand that I may call the clinic during office hours   should I have any questions or need assistance in obtaining follow up care.    __________________________________________  _____________  __________ Signature of Patient or Authorized Representative            Date                   Time    __________________________________________ Nurse's Signature    

## 2011-12-12 ENCOUNTER — Telehealth: Payer: Self-pay | Admitting: *Deleted

## 2011-12-12 ENCOUNTER — Other Ambulatory Visit: Payer: Self-pay | Admitting: Certified Registered Nurse Anesthetist

## 2011-12-12 NOTE — Telephone Encounter (Signed)
Per staff message and POF I have scheduled appts.  JMW  

## 2011-12-14 ENCOUNTER — Other Ambulatory Visit (HOSPITAL_COMMUNITY): Payer: Self-pay | Admitting: Adult Health

## 2011-12-15 ENCOUNTER — Other Ambulatory Visit: Payer: Self-pay | Admitting: Emergency Medicine

## 2011-12-15 ENCOUNTER — Ambulatory Visit (HOSPITAL_BASED_OUTPATIENT_CLINIC_OR_DEPARTMENT_OTHER): Payer: BC Managed Care – PPO

## 2011-12-15 ENCOUNTER — Other Ambulatory Visit (HOSPITAL_BASED_OUTPATIENT_CLINIC_OR_DEPARTMENT_OTHER): Payer: BC Managed Care – PPO | Admitting: Lab

## 2011-12-15 DIAGNOSIS — C787 Secondary malignant neoplasm of liver and intrahepatic bile duct: Secondary | ICD-10-CM

## 2011-12-15 DIAGNOSIS — C7951 Secondary malignant neoplasm of bone: Secondary | ICD-10-CM

## 2011-12-15 DIAGNOSIS — C50919 Malignant neoplasm of unspecified site of unspecified female breast: Secondary | ICD-10-CM

## 2011-12-15 DIAGNOSIS — C801 Malignant (primary) neoplasm, unspecified: Secondary | ICD-10-CM

## 2011-12-15 DIAGNOSIS — Z5112 Encounter for antineoplastic immunotherapy: Secondary | ICD-10-CM

## 2011-12-15 LAB — CBC WITH DIFFERENTIAL/PLATELET
BASO%: 1.5 % (ref 0.0–2.0)
Basophils Absolute: 0 10*3/uL (ref 0.0–0.1)
EOS%: 7.9 % — ABNORMAL HIGH (ref 0.0–7.0)
HGB: 12.3 g/dL (ref 11.6–15.9)
MCH: 31.1 pg (ref 25.1–34.0)
MCHC: 34.8 g/dL (ref 31.5–36.0)
MONO#: 0.5 10*3/uL (ref 0.1–0.9)
RDW: 14.6 % — ABNORMAL HIGH (ref 11.2–14.5)
WBC: 2.4 10*3/uL — ABNORMAL LOW (ref 3.9–10.3)
lymph#: 1.4 10*3/uL (ref 0.9–3.3)

## 2011-12-15 LAB — COMPREHENSIVE METABOLIC PANEL (CC13)
ALT: 26 U/L (ref 0–55)
AST: 45 U/L — ABNORMAL HIGH (ref 5–34)
Albumin: 3.7 g/dL (ref 3.5–5.0)
CO2: 27 mEq/L (ref 22–29)
Calcium: 9.5 mg/dL (ref 8.4–10.4)
Chloride: 103 mEq/L (ref 98–107)
Potassium: 4.2 mEq/L (ref 3.5–5.1)

## 2011-12-15 MED ORDER — HEPARIN SOD (PORK) LOCK FLUSH 100 UNIT/ML IV SOLN
500.0000 [IU] | Freq: Once | INTRAVENOUS | Status: AC | PRN
  Administered 2011-12-15: 500 [IU]
  Filled 2011-12-15: qty 5

## 2011-12-15 MED ORDER — SODIUM CHLORIDE 0.9 % IJ SOLN
10.0000 mL | INTRAMUSCULAR | Status: DC | PRN
  Administered 2011-12-15: 10 mL
  Filled 2011-12-15: qty 10

## 2011-12-15 MED ORDER — ACETAMINOPHEN 325 MG PO TABS
650.0000 mg | ORAL_TABLET | Freq: Once | ORAL | Status: AC
  Administered 2011-12-15: 650 mg via ORAL

## 2011-12-15 MED ORDER — CIPROFLOXACIN HCL 500 MG PO TABS: 500.0000 mg | ORAL_TABLET | Freq: Two times a day (BID) | ORAL | Status: AC

## 2011-12-15 MED ORDER — SODIUM CHLORIDE 0.9 % IV SOLN: Freq: Once | INTRAVENOUS | Status: DC

## 2011-12-15 MED ORDER — SODIUM CHLORIDE 0.9 % IV SOLN
2.0000 mg/kg | Freq: Once | INTRAVENOUS | Status: AC
  Administered 2011-12-15: 126 mg via INTRAVENOUS
  Filled 2011-12-15: qty 6

## 2011-12-15 MED ORDER — DIPHENHYDRAMINE HCL 25 MG PO CAPS: 50.0000 mg | ORAL_CAPSULE | Freq: Once | ORAL | Status: DC

## 2011-12-15 NOTE — Patient Instructions (Signed)
[facility] Discharge Instructions for Patients Receiving Chemotherapy  Today you received the following chemotherapy agents herceptin  To help prevent nausea and vomiting after your treatment, we encourage you to take your nausea medication as directed.   If you develop nausea and vomiting that is not controlled by your nausea medication, call the clinic. If it is after clinic hours your family physician or the after hours number for the clinic or go to the Emergency Department.   BELOW ARE SYMPTOMS THAT SHOULD BE REPORTED IMMEDIATELY:  *FEVER GREATER THAN 100.5 F  *CHILLS WITH OR WITHOUT FEVER  NAUSEA AND VOMITING THAT IS NOT CONTROLLED WITH YOUR NAUSEA MEDICATION  *UNUSUAL SHORTNESS OF BREATH  *UNUSUAL BRUISING OR BLEEDING  TENDERNESS IN MOUTH AND THROAT WITH OR WITHOUT PRESENCE OF ULCERS  *URINARY PROBLEMS  *BOWEL PROBLEMS  UNUSUAL RASH Items with * indicate a potential emergency and should be followed up as soon as possible.  One of the nurses will contact you 24 hours after your treatment. Please let the nurse know about any problems that you may have experienced. Feel free to call the clinic you have any questions or concerns. The clinic phone number is [phone].   I have been informed and understand all the instructions given to me. I know to contact the clinic, my physician, or go to the Emergency Department if any problems should occur. I do not have any questions at this time, but understand that I may call the clinic during office hours   should I have any questions or need assistance in obtaining follow up care.    __________________________________________  _____________  __________ Signature of Patient or Authorized Representative            Date                   Time    __________________________________________ Nurse's Signature    

## 2011-12-19 ENCOUNTER — Other Ambulatory Visit: Payer: Self-pay | Admitting: Oncology

## 2011-12-19 DIAGNOSIS — C50919 Malignant neoplasm of unspecified site of unspecified female breast: Secondary | ICD-10-CM

## 2011-12-20 ENCOUNTER — Ambulatory Visit (HOSPITAL_BASED_OUTPATIENT_CLINIC_OR_DEPARTMENT_OTHER): Payer: BC Managed Care – PPO | Admitting: Lab

## 2011-12-20 ENCOUNTER — Other Ambulatory Visit: Payer: Self-pay | Admitting: Oncology

## 2011-12-20 DIAGNOSIS — C787 Secondary malignant neoplasm of liver and intrahepatic bile duct: Secondary | ICD-10-CM

## 2011-12-20 DIAGNOSIS — C801 Malignant (primary) neoplasm, unspecified: Secondary | ICD-10-CM

## 2011-12-20 DIAGNOSIS — C50919 Malignant neoplasm of unspecified site of unspecified female breast: Secondary | ICD-10-CM

## 2011-12-20 LAB — COMPREHENSIVE METABOLIC PANEL (CC13)
CO2: 24 mEq/L (ref 22–29)
Calcium: 9.8 mg/dL (ref 8.4–10.4)
Glucose: 92 mg/dl (ref 70–99)
Sodium: 138 mEq/L (ref 136–145)
Total Bilirubin: 0.4 mg/dL (ref 0.20–1.20)
Total Protein: 7.4 g/dL (ref 6.4–8.3)

## 2011-12-20 LAB — CBC WITH DIFFERENTIAL/PLATELET
Eosinophils Absolute: 0.1 10*3/uL (ref 0.0–0.5)
HCT: 38.6 % (ref 34.8–46.6)
LYMPH%: 35.7 % (ref 14.0–49.7)
MONO#: 0.8 10*3/uL (ref 0.1–0.9)
NEUT#: 1.8 10*3/uL (ref 1.5–6.5)
NEUT%: 41 % (ref 38.4–76.8)
Platelets: 210 10*3/uL (ref 145–400)
WBC: 4.4 10*3/uL (ref 3.9–10.3)

## 2011-12-20 LAB — TECHNOLOGIST REVIEW

## 2011-12-20 LAB — CANCER ANTIGEN 27.29: CA 27.29: 140 U/mL — ABNORMAL HIGH (ref 0–39)

## 2011-12-22 ENCOUNTER — Ambulatory Visit (HOSPITAL_BASED_OUTPATIENT_CLINIC_OR_DEPARTMENT_OTHER): Payer: BC Managed Care – PPO

## 2011-12-22 ENCOUNTER — Other Ambulatory Visit: Payer: Self-pay | Admitting: *Deleted

## 2011-12-22 ENCOUNTER — Ambulatory Visit (HOSPITAL_BASED_OUTPATIENT_CLINIC_OR_DEPARTMENT_OTHER): Payer: BC Managed Care – PPO | Admitting: Oncology

## 2011-12-22 ENCOUNTER — Other Ambulatory Visit (HOSPITAL_BASED_OUTPATIENT_CLINIC_OR_DEPARTMENT_OTHER): Payer: BC Managed Care – PPO | Admitting: Lab

## 2011-12-22 DIAGNOSIS — C787 Secondary malignant neoplasm of liver and intrahepatic bile duct: Secondary | ICD-10-CM

## 2011-12-22 DIAGNOSIS — C7951 Secondary malignant neoplasm of bone: Secondary | ICD-10-CM

## 2011-12-22 DIAGNOSIS — C50919 Malignant neoplasm of unspecified site of unspecified female breast: Secondary | ICD-10-CM

## 2011-12-22 DIAGNOSIS — Z5111 Encounter for antineoplastic chemotherapy: Secondary | ICD-10-CM

## 2011-12-22 DIAGNOSIS — C801 Malignant (primary) neoplasm, unspecified: Secondary | ICD-10-CM

## 2011-12-22 DIAGNOSIS — Z5112 Encounter for antineoplastic immunotherapy: Secondary | ICD-10-CM

## 2011-12-22 DIAGNOSIS — C7952 Secondary malignant neoplasm of bone marrow: Secondary | ICD-10-CM

## 2011-12-22 LAB — CBC WITH DIFFERENTIAL/PLATELET
Basophils Absolute: 0 10*3/uL (ref 0.0–0.1)
Eosinophils Absolute: 0.2 10*3/uL (ref 0.0–0.5)
HGB: 12.6 g/dL (ref 11.6–15.9)
MCV: 87.8 fL (ref 79.5–101.0)
NEUT#: 3.8 10*3/uL (ref 1.5–6.5)
RDW: 13.9 % (ref 11.2–14.5)
lymph#: 1.9 10*3/uL (ref 0.9–3.3)

## 2011-12-22 MED ORDER — FLUOROURACIL CHEMO INJECTION 2.5 GM/50ML
600.0000 mg/m2 | Freq: Once | INTRAVENOUS | Status: AC
  Administered 2011-12-22: 1050 mg via INTRAVENOUS
  Filled 2011-12-22: qty 21

## 2011-12-22 MED ORDER — DEXAMETHASONE SODIUM PHOSPHATE 10 MG/ML IJ SOLN
10.0000 mg | Freq: Once | INTRAMUSCULAR | Status: AC
  Administered 2011-12-22: 10 mg via INTRAVENOUS

## 2011-12-22 MED ORDER — HEPARIN SOD (PORK) LOCK FLUSH 100 UNIT/ML IV SOLN
500.0000 [IU] | Freq: Once | INTRAVENOUS | Status: AC | PRN
  Administered 2011-12-22: 500 [IU]
  Filled 2011-12-22: qty 5

## 2011-12-22 MED ORDER — DENOSUMAB 120 MG/1.7ML ~~LOC~~ SOLN
120.0000 mg | Freq: Once | SUBCUTANEOUS | Status: AC
  Administered 2011-12-22: 120 mg via SUBCUTANEOUS
  Filled 2011-12-22: qty 1.7

## 2011-12-22 MED ORDER — METHOTREXATE SODIUM CHEMO INJECTION 25 MG/ML
40.0000 mg/m2 | Freq: Once | INTRAMUSCULAR | Status: AC
  Administered 2011-12-22: 67.5 mg via INTRAVENOUS
  Filled 2011-12-22: qty 2.7

## 2011-12-22 MED ORDER — ONDANSETRON 8 MG/50ML IVPB (CHCC)
8.0000 mg | Freq: Once | INTRAVENOUS | Status: AC
  Administered 2011-12-22: 8 mg via INTRAVENOUS

## 2011-12-22 MED ORDER — ACETAMINOPHEN 325 MG PO TABS
650.0000 mg | ORAL_TABLET | Freq: Once | ORAL | Status: AC
  Administered 2011-12-22: 650 mg via ORAL

## 2011-12-22 MED ORDER — TRASTUZUMAB CHEMO INJECTION 440 MG
2.0000 mg/kg | Freq: Once | INTRAVENOUS | Status: AC
  Administered 2011-12-22: 126 mg via INTRAVENOUS
  Filled 2011-12-22: qty 6

## 2011-12-22 MED ORDER — SODIUM CHLORIDE 0.9 % IJ SOLN
10.0000 mL | INTRAMUSCULAR | Status: DC | PRN
  Administered 2011-12-22: 10 mL
  Filled 2011-12-22: qty 10

## 2011-12-22 MED ORDER — SODIUM CHLORIDE 0.9 % IV SOLN
600.0000 mg/m2 | Freq: Once | INTRAVENOUS | Status: AC
  Administered 2011-12-22: 1020 mg via INTRAVENOUS
  Filled 2011-12-22: qty 51

## 2011-12-22 MED ORDER — SODIUM CHLORIDE 0.9 % IV SOLN
Freq: Once | INTRAVENOUS | Status: AC
  Administered 2011-12-22: 16:00:00 via INTRAVENOUS

## 2011-12-22 NOTE — Patient Instructions (Signed)
[facility] Discharge Instructions for Patients Receiving Chemotherapy  Today you received the following chemotherapy agents Herceptin, Methotrexate, Adrucil and Cytoxan.  To help prevent nausea and vomiting after your treatment, we encourage you to take your nausea medication. Begin taking your nausea medication as often as prescribed by Dr. Donnie Coffin.    If you develop nausea and vomiting that is not controlled by your nausea medication, call the clinic. If it is after clinic hours your family physician or the after hours number for the clinic or go to the Emergency Department.   BELOW ARE SYMPTOMS THAT SHOULD BE REPORTED IMMEDIATELY:  *FEVER GREATER THAN 100.5 F  *CHILLS WITH OR WITHOUT FEVER  NAUSEA AND VOMITING THAT IS NOT CONTROLLED WITH YOUR NAUSEA MEDICATION  *UNUSUAL SHORTNESS OF BREATH  *UNUSUAL BRUISING OR BLEEDING  TENDERNESS IN MOUTH AND THROAT WITH OR WITHOUT PRESENCE OF ULCERS  *URINARY PROBLEMS  *BOWEL PROBLEMS  UNUSUAL RASH Items with * indicate a potential emergency and should be followed up as soon as possible.  One of the nurses will contact you 24 hours after your treatment. Please let the nurse know about any problems that you may have experienced. Feel free to call the clinic you have any questions or concerns. The clinic phone number is [phone].   I have been informed and understand all the instructions given to me. I know to contact the clinic, my physician, or go to the Emergency Department if any problems should occur. I do not have any questions at this time, but understand that I may call the clinic during office hours   should I have any questions or need assistance in obtaining follow up care.    __________________________________________  _____________  __________ Signature of Patient or Authorized Representative            Date                   Time    __________________________________________ Nurse's Signature

## 2011-12-23 LAB — CANCER ANTIGEN 27.29: CA 27.29: 146 U/mL — ABNORMAL HIGH (ref 0–39)

## 2011-12-24 ENCOUNTER — Other Ambulatory Visit: Payer: Self-pay | Admitting: Oncology

## 2011-12-24 DIAGNOSIS — C50919 Malignant neoplasm of unspecified site of unspecified female breast: Secondary | ICD-10-CM

## 2011-12-24 NOTE — Progress Notes (Signed)
Hematology and Oncology Follow Up Visit  Kathy Jimenez [id number] [birth year] 41 y.o. 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 Currently day 1 of cycle 7 of every 3 week Kadcyla. She also receives every 3 week Faslodex, Zoladex, and Xgeva. We recently image her abdomen because of rising tumor marker and LDH. She had a mass in her left lobe which look appear to be somewhat enlarged and so there has been a change in Kadcyla Has Been Discontinued She  Started  CMF with Herceptin Given Weekly, this is day 1 cycle 3.  Interim History:   She tolerated the CMF. She has no complaints. She thinks that the palpable epigastric pain is little bit improved. She has not noticed any alopecia. She isn't having some discomfort in her mid chest area pain seems to reading towards her back. She is otherwise doing fairly well her epigastric discomfort is a little bit improved. A detailed review of systems is otherwise noncontributory as noted below.  Review of Systems: Constitutional:  no weight loss, fever, night sweats and feels well Eyes: no complaints ENT: no complaints Cardiovascular: no chest pain or dyspnea on exertion Respiratory: no cough, shortness of breath, or wheezing Neurological: no TIA or stroke symptoms Dermatological: negative Gastrointestinal: no abdominal pain, change in bowel habits, or black or bloody stools Genito-Urinary: no dysuria, trouble voiding, or hematuria Hematological and Lymphatic: negative Breast: negative Musculoskeletal: negative Remaining ROS negative.   Medications:   I have reviewed the patient's current medications.  Current Outpatient Prescriptions  Medication Sig Dispense Refill  . carvedilol (COREG) 12.5 MG tablet Take 12.5 mg by mouth 2 (two) times daily with a meal.      . ciprofloxacin (CIPRO) 500 MG tablet Take 1 tablet (500 mg  total) by mouth 2 (two) times daily.  14 tablet  0  . dexamethasone (DECADRON) 4 MG tablet Take 2 tablets by mouth daily starting the day after chemotherapy for 2 days. Take with food.  30 tablet  1  . digoxin (LANOXIN) 0.25 MG tablet TAKE 1 TABLET BY MOUTH EVERY DAY  30 tablet  6  . lisinopril (PRINIVIL,ZESTRIL) 10 MG tablet Take 1 tablet (10 mg total) by mouth 2 (two) times daily.  60 tablet  6  . LORazepam (ATIVAN) 0.5 MG tablet Take 1 tablet (0.5 mg total) by mouth every 6 (six) hours as needed (Nausea or vomiting).  30 tablet  0  . ondansetron (ZOFRAN) 8 MG tablet Take 1 tablet two times a day starting the day after chemo for 2 days. Then take 1 tablet two times a day as needed for nausea or vomiting.  30 tablet  1  . SODIUM CITRATE PO Take 1 tablet by mouth 2 (two) times daily.       Marland Kitchen spironolactone (ALDACTONE) 25 MG tablet Take 25 mg by mouth daily.      Marland Kitchen DISCONTD: furosemide (LASIX) 20 MG tablet Take 1 tablet (20 mg total) by mouth daily as needed (If weight increases 3 pounds in 24 hours).  30 tablet  6    Allergies:  Allergies  Allergen Reactions  . Sulfa Antibiotics Hives    Physical Exam: There were no vitals filed for this visit.  There is no height or weight on file to calculate BMI. Weight: 141 lbs. HEENT:  Sclerae anicteric, conjunctivae pink.  Oropharynx clear.  No mucositis or candidiasis.   Nodes:  No cervical, supraclavicular, or axillary lymphadenopathy palpated.   Lungs:  Clear to auscultation bilaterally.  No crackles, rhonchi, or wheezes.   Heart:  Regular rate and rhythm.   Abdomen:  Abdomen is soft, nontender, without evidence of hepatomegaly. Normal bowel sounds are present. No evidence of distention. Musculoskeletal:  No focal spinal tenderness to palpation.  Extremities:  Benign.  No peripheral edema or cyanosis.   Skin:  Benign.   Neuro:  Nonfocal, alert and oriented x 3.   Lab Results: Lab Results  Component Value Date   WBC 7.0 12/22/2011   HGB 12.6  12/22/2011   HCT 36.0 12/22/2011   MCV 87.8 12/22/2011   PLT 185 12/22/2011   NEUTROABS 3.8 12/22/2011     Chemistry      Component Value Date/Time   NA 138 12/20/2011 0756   NA 138 12/01/2011 1530   NA 141 09/02/2010 1134   K 4.4 12/20/2011 0756   K 3.8 12/01/2011 1530   K 4.3 09/02/2010 1134   CL 104 12/20/2011 0756   CL 102 12/01/2011 1530   CL 103 09/02/2010 1134   CO2 24 12/20/2011 0756   CO2 27 12/01/2011 1530   CO2 25 09/02/2010 1134   BUN 16.0 12/20/2011 0756   BUN 18 12/01/2011 1530   BUN 11 09/02/2010 1134   CREATININE 1.1 12/20/2011 0756   CREATININE 0.95 12/01/2011 1530   CREATININE 0.7 09/02/2010 1134      Component Value Date/Time   CALCIUM 9.8 12/20/2011 0756   CALCIUM 9.8 12/01/2011 1530   CALCIUM 8.4 09/02/2010 1134   ALKPHOS 104 12/20/2011 0756   ALKPHOS 86 12/01/2011 1530   ALKPHOS 84 09/02/2010 1134   AST 55* 12/20/2011 0756   AST 56* 12/01/2011 1530   AST 27 09/02/2010 1134   ALT 34 12/20/2011 0756   ALT 37* 12/01/2011 1530   BILITOT 0.40 12/20/2011 0756   BILITOT 0.4 12/01/2011 1530   BILITOT 0.60 09/02/2010 1134      Lab Results  Component Value Date   LABCA2 146* 12/22/2011    Radiological Studies: 10/16/11 CT CHEST, ABDOMEN AND PELVIS WITH CONTRAST  Technique: Multidetector CT imaging of the chest, abdomen and  pelvis was performed following the standard protocol during bolus  administration of intravenous contrast.  Contrast: OMNIPAQUE IOHEXOL 300 MG/ML SOLN  Comparison: Chest CT from 02/22/2011, abdomen CT from 06/15/2011  CT CHEST  Findings: No axillary or supraclavicular adenopathy. There is no  mediastinal or hilar adenopathy. No pericardial or pleural  effusion.  The lungs are clear. No pulmonary nodule or mass identified.  Review of the visualized osseous structures shows a metastatic  lesion within the L1 vertebra. This measures 1.3 cm, image 55,  unchanged from previous exam. There are sclerotic metastasis  involving the manubrium and sternum which  are similar to previous exam.  IMPRESSION:  1. No pulmonary nodule or metastatic adenopathy identified.  2. Similar appearance of the sclerotic bone metastasis  CT ABDOMEN AND PELVIS  Findings: The spleen is negative. The gallbladder is normal. No  biliary dilatation. Normal appearance of the pancreas. Multifocal  liver metastasis are again noted. The medial right hepatic lobe  lesion has resolved in the interval. Lesion within the lateral  segment of the left hepatic lobe measures 3.9 x 5.8 cm, image 44.  Unchanged from previous exam. Anterior right hepatic lobe lesion  measures 1.5 x 1.0 cm, image 50. Previously this measured 1.9 x  1.6 cm. The stomach appears normal. The  small bowel loops have a normal course and caliber. The appendix is identified and appears normal. Normal appearance of the colon.  Both adrenal glands are normal. Left kidney contains several  punctate stones measuring approximately 2 mm. There is right-sided hydronephrosis and hydroureter. This appears increased from previous exam. Within the distal ureter there is a stone measuring 3 mm, image 96. New from previous exam. The urinary bladder appears normal. The uterus and adnexal structures are unremarkable.  No free fluid within the abdomen or pelvis. There is a  gastrohepatic ligament lymph node which measures 0.9 cm.  Previously 0.6 cm. No additional enlarged or enlarging abdominal  or pelvic lymph nodes. Mixed sclerotic and lytic metastasis  involving the left pubic rami is unchanged in appearance from  previous exam, image 114.  IMPRESSION:  1. Overall improvement in liver metastases.  2. Borderline enlarged gastrohepatic ligament lymph node has  increased in size from previous exam.  3. Progressive right hydronephrosis secondary to distal right  ureteral calculus.  These results will be called to the ordering clinician or  representative by the Radiologist Assistant, and communication  documented in the PACS  Dashboard.  Original Report Authenticated By: Rosealee Albee, M.D.     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 Currently day 1 of cycle 3 of every 3 week CMF. 2. CHF: followed closely by Dr. Gala Romney. Recent ECHO fairly stable per Cardiology note, upcoming follow up appointment on 11/07/11      Plan:  Patient is doing fairly well. This is her third cycle of CMF. Similar labs including tumor markers are little bit higher than we like. Maybe inclined to go ahead with restaging scans after this cycle. She has not responded as well as we would like to sell his HER-2 positive. There may being also thinking of waiting and repeating liver biopsy.  Pierce Crane, MD 10/27/11

## 2011-12-25 ENCOUNTER — Encounter: Payer: Self-pay | Admitting: *Deleted

## 2011-12-25 ENCOUNTER — Telehealth: Payer: Self-pay | Admitting: *Deleted

## 2011-12-25 NOTE — Telephone Encounter (Signed)
12-29-2011 and 01-05-2012 per orders from 12-25-2011  Lab and infusion  Sent michele an email to set up patient's treatment

## 2011-12-26 ENCOUNTER — Telehealth: Payer: Self-pay | Admitting: *Deleted

## 2011-12-26 NOTE — Telephone Encounter (Signed)
Per staff message and POF I have scheduled appts.  JMW  

## 2011-12-28 ENCOUNTER — Encounter (HOSPITAL_COMMUNITY): Payer: Self-pay

## 2011-12-28 ENCOUNTER — Encounter (HOSPITAL_COMMUNITY)
Admission: RE | Admit: 2011-12-28 | Discharge: 2011-12-28 | Disposition: A | Discharge status: Home / Self Care | Payer: BC Managed Care – PPO | Source: Ambulatory Visit | Attending: Oncology | Admitting: Oncology

## 2011-12-28 DIAGNOSIS — C50919 Malignant neoplasm of unspecified site of unspecified female breast: Secondary | ICD-10-CM | POA: Insufficient documentation

## 2011-12-28 DIAGNOSIS — R599 Enlarged lymph nodes, unspecified: Secondary | ICD-10-CM | POA: Insufficient documentation

## 2011-12-28 DIAGNOSIS — C787 Secondary malignant neoplasm of liver and intrahepatic bile duct: Secondary | ICD-10-CM | POA: Insufficient documentation

## 2011-12-28 DIAGNOSIS — C7951 Secondary malignant neoplasm of bone: Secondary | ICD-10-CM | POA: Insufficient documentation

## 2011-12-28 LAB — GLUCOSE, CAPILLARY: Glucose-Capillary: 97 mg/dL (ref 70–99)

## 2011-12-28 MED ORDER — FLUDEOXYGLUCOSE F - 18 (FDG) INJECTION
17.8000 | Freq: Once | INTRAVENOUS | Status: AC | PRN
  Administered 2011-12-28: 17.8 via INTRAVENOUS

## 2011-12-29 ENCOUNTER — Other Ambulatory Visit (HOSPITAL_BASED_OUTPATIENT_CLINIC_OR_DEPARTMENT_OTHER): Payer: Self-pay | Admitting: Lab

## 2011-12-29 ENCOUNTER — Other Ambulatory Visit: Payer: Self-pay | Admitting: Emergency Medicine

## 2011-12-29 ENCOUNTER — Other Ambulatory Visit: Payer: Self-pay | Admitting: Oncology

## 2011-12-29 ENCOUNTER — Ambulatory Visit (HOSPITAL_BASED_OUTPATIENT_CLINIC_OR_DEPARTMENT_OTHER): Payer: BC Managed Care – PPO

## 2011-12-29 VITALS — BP 106/77 | HR 76 | Temp 98.5°F | Resp 18

## 2011-12-29 DIAGNOSIS — C50919 Malignant neoplasm of unspecified site of unspecified female breast: Secondary | ICD-10-CM

## 2011-12-29 DIAGNOSIS — Z5111 Encounter for antineoplastic chemotherapy: Secondary | ICD-10-CM

## 2011-12-29 DIAGNOSIS — C787 Secondary malignant neoplasm of liver and intrahepatic bile duct: Secondary | ICD-10-CM

## 2011-12-29 DIAGNOSIS — C7951 Secondary malignant neoplasm of bone: Secondary | ICD-10-CM

## 2011-12-29 DIAGNOSIS — Z5112 Encounter for antineoplastic immunotherapy: Secondary | ICD-10-CM

## 2011-12-29 DIAGNOSIS — C801 Malignant (primary) neoplasm, unspecified: Secondary | ICD-10-CM

## 2011-12-29 LAB — COMPREHENSIVE METABOLIC PANEL (CC13)
AST: 34 U/L (ref 5–34)
Albumin: 3.7 g/dL (ref 3.5–5.0)
BUN: 22 mg/dL (ref 7.0–26.0)
CO2: 24 mEq/L (ref 22–29)
Calcium: 9.3 mg/dL (ref 8.4–10.4)
Chloride: 105 mEq/L (ref 98–107)
Potassium: 4.5 mEq/L (ref 3.5–5.1)

## 2011-12-29 LAB — CBC WITH DIFFERENTIAL/PLATELET
Basophils Absolute: 0 10*3/uL (ref 0.0–0.1)
EOS%: 2 % (ref 0.0–7.0)
Eosinophils Absolute: 0.1 10*3/uL (ref 0.0–0.5)
HGB: 12.4 g/dL (ref 11.6–15.9)
MCH: 31.3 pg (ref 25.1–34.0)
MONO#: 0.1 10*3/uL (ref 0.1–0.9)
NEUT#: 4.1 10*3/uL (ref 1.5–6.5)
RDW: 14.4 % (ref 11.2–14.5)
WBC: 5.5 10*3/uL (ref 3.9–10.3)
lymph#: 1.2 10*3/uL (ref 0.9–3.3)

## 2011-12-29 MED ORDER — HEPARIN SOD (PORK) LOCK FLUSH 100 UNIT/ML IV SOLN
500.0000 [IU] | Freq: Once | INTRAVENOUS | Status: AC | PRN
  Administered 2011-12-29: 500 [IU]
  Filled 2011-12-29: qty 5

## 2011-12-29 MED ORDER — DIPHENHYDRAMINE HCL 25 MG PO CAPS: 50.0000 mg | ORAL_CAPSULE | Freq: Once | ORAL | Status: DC

## 2011-12-29 MED ORDER — ACETAMINOPHEN 325 MG PO TABS
650.0000 mg | ORAL_TABLET | Freq: Once | ORAL | Status: AC
  Administered 2011-12-29: 650 mg via ORAL

## 2011-12-29 MED ORDER — SODIUM CHLORIDE 0.9 % IV SOLN
Freq: Once | INTRAVENOUS | Status: AC
  Administered 2011-12-29: 16:00:00 via INTRAVENOUS

## 2011-12-29 MED ORDER — GOSERELIN ACETATE 3.6 MG ~~LOC~~ IMPL
3.6000 mg | DRUG_IMPLANT | Freq: Once | SUBCUTANEOUS | Status: AC
  Administered 2011-12-29: 3.6 mg via SUBCUTANEOUS
  Filled 2011-12-29: qty 3.6

## 2011-12-29 MED ORDER — TRASTUZUMAB CHEMO INJECTION 440 MG
2.0000 mg/kg | Freq: Once | INTRAVENOUS | Status: AC
  Administered 2011-12-29: 126 mg via INTRAVENOUS
  Filled 2011-12-29: qty 6

## 2011-12-29 MED ORDER — SODIUM CHLORIDE 0.9 % IJ SOLN
10.0000 mL | INTRAMUSCULAR | Status: DC | PRN
  Administered 2011-12-29: 10 mL
  Filled 2011-12-29: qty 10

## 2011-12-29 NOTE — Patient Instructions (Signed)
Patient aware of next appointment; discharged home with no complaints. 

## 2012-01-04 ENCOUNTER — Other Ambulatory Visit: Payer: Self-pay | Admitting: Radiology

## 2012-01-05 ENCOUNTER — Telehealth: Payer: Self-pay | Admitting: *Deleted

## 2012-01-05 ENCOUNTER — Other Ambulatory Visit: Payer: Self-pay | Admitting: *Deleted

## 2012-01-05 ENCOUNTER — Ambulatory Visit (HOSPITAL_BASED_OUTPATIENT_CLINIC_OR_DEPARTMENT_OTHER): Payer: BC Managed Care – PPO

## 2012-01-05 ENCOUNTER — Other Ambulatory Visit (HOSPITAL_BASED_OUTPATIENT_CLINIC_OR_DEPARTMENT_OTHER): Payer: BC Managed Care – PPO | Admitting: Lab

## 2012-01-05 VITALS — BP 123/76 | HR 98 | Temp 98.4°F | Resp 20

## 2012-01-05 DIAGNOSIS — Z5112 Encounter for antineoplastic immunotherapy: Secondary | ICD-10-CM

## 2012-01-05 DIAGNOSIS — C7951 Secondary malignant neoplasm of bone: Secondary | ICD-10-CM

## 2012-01-05 DIAGNOSIS — C787 Secondary malignant neoplasm of liver and intrahepatic bile duct: Secondary | ICD-10-CM

## 2012-01-05 DIAGNOSIS — C50919 Malignant neoplasm of unspecified site of unspecified female breast: Secondary | ICD-10-CM

## 2012-01-05 DIAGNOSIS — C7952 Secondary malignant neoplasm of bone marrow: Secondary | ICD-10-CM

## 2012-01-05 DIAGNOSIS — C801 Malignant (primary) neoplasm, unspecified: Secondary | ICD-10-CM

## 2012-01-05 LAB — CBC WITH DIFFERENTIAL/PLATELET
EOS%: 8.6 % — ABNORMAL HIGH (ref 0.0–7.0)
Eosinophils Absolute: 0.1 10*3/uL (ref 0.0–0.5)
LYMPH%: 64.8 % — ABNORMAL HIGH (ref 14.0–49.7)
MCH: 30.7 pg (ref 25.1–34.0)
MCHC: 34.8 g/dL (ref 31.5–36.0)
MCV: 88.2 fL (ref 79.5–101.0)
MONO%: 25 % — ABNORMAL HIGH (ref 0.0–14.0)
NEUT#: 0 10*3/uL — CL (ref 1.5–6.5)
RBC: 3.81 10*6/uL (ref 3.70–5.45)
RDW: 13.8 % (ref 11.2–14.5)

## 2012-01-05 LAB — COMPREHENSIVE METABOLIC PANEL (CC13)
ALT: 23 U/L (ref 0–55)
AST: 44 U/L — ABNORMAL HIGH (ref 5–34)
Albumin: 3.7 g/dL (ref 3.5–5.0)
Alkaline Phosphatase: 93 U/L (ref 40–150)
Creatinine: 1.1 mg/dL (ref 0.6–1.1)
Potassium: 3.9 mEq/L (ref 3.5–5.1)
Sodium: 138 mEq/L (ref 136–145)
Total Protein: 7.1 g/dL (ref 6.4–8.3)

## 2012-01-05 MED ORDER — SODIUM CHLORIDE 0.9 % IV SOLN
Freq: Once | INTRAVENOUS | Status: AC
  Administered 2012-01-05: 16:00:00 via INTRAVENOUS

## 2012-01-05 MED ORDER — HEPARIN SOD (PORK) LOCK FLUSH 100 UNIT/ML IV SOLN
500.0000 [IU] | Freq: Once | INTRAVENOUS | Status: DC | PRN
  Filled 2012-01-05: qty 5

## 2012-01-05 MED ORDER — ACETAMINOPHEN 325 MG PO TABS
650.0000 mg | ORAL_TABLET | Freq: Once | ORAL | Status: AC
  Administered 2012-01-05: 650 mg via ORAL

## 2012-01-05 MED ORDER — TRASTUZUMAB CHEMO INJECTION 440 MG
2.0000 mg/kg | Freq: Once | INTRAVENOUS | Status: AC
  Administered 2012-01-05: 126 mg via INTRAVENOUS
  Filled 2012-01-05: qty 6

## 2012-01-05 MED ORDER — SODIUM CHLORIDE 0.9 % IJ SOLN
10.0000 mL | INTRAMUSCULAR | Status: DC | PRN
  Filled 2012-01-05: qty 10

## 2012-01-05 MED ORDER — CIPROFLOXACIN HCL 500 MG PO TABS: 500.0000 mg | ORAL_TABLET | Freq: Two times a day (BID) | ORAL | Status: DC

## 2012-01-05 MED ORDER — FULVESTRANT 250 MG/5ML IM SOLN: 500.0000 mg | Freq: Once | INTRAMUSCULAR | Status: DC

## 2012-01-05 NOTE — Progress Notes (Signed)
Dr Donnie Coffin aware of labs, orders for herceptin, no faslodex and victoria rn will call in presc for cipro for pt to pick up after tx.  dmr

## 2012-01-05 NOTE — Patient Instructions (Addendum)
[facility] Discharge Instructions for Patients Receiving Chemotherapy  Today you received the following chemotherapy agents herceptin  To help prevent nausea and vomiting after your treatment, we encourage you to take your nausea medication  and take it as often as prescribedIf you develop nausea and vomiting that is not controlled by your nausea medication, call the clinic. If it is after clinic hours your family physician or the after hours number for the clinic or go to the Emergency Department.   BELOW ARE SYMPTOMS THAT SHOULD BE REPORTED IMMEDIATELY:  *FEVER GREATER THAN 100.5 F  *CHILLS WITH OR WITHOUT FEVER  NAUSEA AND VOMITING THAT IS NOT CONTROLLED WITH YOUR NAUSEA MEDICATION  *UNUSUAL SHORTNESS OF BREATH  *UNUSUAL BRUISING OR BLEEDING  TENDERNESS IN MOUTH AND THROAT WITH OR WITHOUT PRESENCE OF ULCERS  *URINARY PROBLEMS  *BOWEL PROBLEMS  UNUSUAL RASH Items with * indicate a potential emergency and should be followed up as soon as possible.  One of the nurses will contact you 24 hours after your treatment. Please let the nurse know about any problems that you may have experienced. Feel free to call the clinic you have any questions or concerns. The clinic phone number is [phone].   I have been informed and understand all the instructions given to me. I know to contact the clinic, my physician, or go to the Emergency Department if any problems should occur. I do not have any questions at this time, but understand that I may call the clinic during office hours   should I have any questions or need assistance in obtaining follow up care.    __________________________________________  _____________  __________ Signature of Patient or Authorized Representative            Date                   Time    __________________________________________ Nurse's Signature    

## 2012-01-07 ENCOUNTER — Other Ambulatory Visit: Payer: Self-pay | Admitting: Oncology

## 2012-01-07 DIAGNOSIS — C50919 Malignant neoplasm of unspecified site of unspecified female breast: Secondary | ICD-10-CM

## 2012-01-09 ENCOUNTER — Other Ambulatory Visit: Payer: Self-pay | Admitting: Radiology

## 2012-01-10 ENCOUNTER — Encounter (HOSPITAL_COMMUNITY): Payer: Self-pay

## 2012-01-10 ENCOUNTER — Ambulatory Visit (HOSPITAL_COMMUNITY)
Admission: RE | Admit: 2012-01-10 | Discharge: 2012-01-10 | Disposition: A | Discharge status: Home / Self Care | Payer: BC Managed Care – PPO | Source: Ambulatory Visit | Attending: Oncology | Admitting: Oncology

## 2012-01-10 DIAGNOSIS — Z9221 Personal history of antineoplastic chemotherapy: Secondary | ICD-10-CM | POA: Insufficient documentation

## 2012-01-10 DIAGNOSIS — C787 Secondary malignant neoplasm of liver and intrahepatic bile duct: Secondary | ICD-10-CM | POA: Insufficient documentation

## 2012-01-10 DIAGNOSIS — C50919 Malignant neoplasm of unspecified site of unspecified female breast: Secondary | ICD-10-CM

## 2012-01-10 LAB — CBC
HCT: 35.1 % — ABNORMAL LOW (ref 36.0–46.0)
MCHC: 34.2 g/dL (ref 30.0–36.0)
RDW: 13.8 % (ref 11.5–15.5)

## 2012-01-10 LAB — PROTIME-INR
INR: 1.13 (ref 0.00–1.49)
Prothrombin Time: 14.3 seconds (ref 11.6–15.2)

## 2012-01-10 LAB — APTT: aPTT: 32 seconds (ref 24–37)

## 2012-01-10 MED ORDER — HYDROCODONE-ACETAMINOPHEN 5-325 MG PO TABS
1.0000 | ORAL_TABLET | ORAL | Status: DC | PRN
  Administered 2012-01-10: 1 via ORAL

## 2012-01-10 MED ORDER — MIDAZOLAM HCL 5 MG/5ML IJ SOLN
INTRAMUSCULAR | Status: AC | PRN
  Administered 2012-01-10 (×2): 1 mg via INTRAVENOUS
  Administered 2012-01-10: 0.5 mg via INTRAVENOUS
  Administered 2012-01-10: 1 mg via INTRAVENOUS
  Administered 2012-01-10: 0.5 mg via INTRAVENOUS

## 2012-01-10 MED ORDER — SODIUM CHLORIDE 0.9 % IV SOLN: Freq: Once | INTRAVENOUS | Status: DC

## 2012-01-10 MED ORDER — MIDAZOLAM HCL 2 MG/2ML IJ SOLN
INTRAMUSCULAR | Status: AC
  Filled 2012-01-10: qty 4

## 2012-01-10 MED ORDER — ONDANSETRON HCL 4 MG/2ML IJ SOLN
INTRAMUSCULAR | Status: AC
  Filled 2012-01-10: qty 2

## 2012-01-10 MED ORDER — FENTANYL CITRATE 0.05 MG/ML IJ SOLN
INTRAMUSCULAR | Status: AC | PRN
  Administered 2012-01-10: 50 ug via INTRAVENOUS
  Administered 2012-01-10: 25 ug via INTRAVENOUS
  Administered 2012-01-10: 50 ug via INTRAVENOUS
  Administered 2012-01-10: 25 ug via INTRAVENOUS

## 2012-01-10 MED ORDER — FENTANYL CITRATE 0.05 MG/ML IJ SOLN
INTRAMUSCULAR | Status: AC
  Filled 2012-01-10: qty 2

## 2012-01-10 MED ORDER — ONDANSETRON HCL 4 MG/2ML IJ SOLN
4.0000 mg | Freq: Once | INTRAMUSCULAR | Status: AC
  Administered 2012-01-10: 4 mg via INTRAVENOUS

## 2012-01-10 MED ORDER — HYDROCODONE-ACETAMINOPHEN 5-325 MG PO TABS
ORAL_TABLET | ORAL | Status: AC
  Filled 2012-01-10: qty 1

## 2012-01-10 NOTE — H&P (Signed)
Agree.  For liver biopsy today. 

## 2012-01-10 NOTE — Procedures (Signed)
Procedure:  Ultrasound guided liver biopsy Procedure:  Core biopsy of left lobe liver lesion, 18 G cores x 4 via 17 G needle. No immediate complications.

## 2012-01-10 NOTE — H&P (Signed)
Chief Complaint: Hx of breast cancer with mets to liver Referring Physician:Rubin HPI: Kathy Jimenez is an 41 y.o. female with hx of breast cancer and multiple liver mets. She is scheduled for liver lesion biopsy for additional testing to hopefully aid in her treatment. She has otherwise been well, no current c/o.  Past Medical History:  Past Medical History  Diagnosis Date  . 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 Kidney stones     recurrent since 1989  . Endometrial polyp   . PONV (postoperative nausea and vomiting)   . CHF (congestive heart failure)     from chemo treatment    Past Surgical History:  Past Surgical History  Procedure Date  . Hysteroscopy w/ endometrial ablation 03/11/2009    resection of endometrial polyps, total endometrial resection  . Breast lumpectomy 2004  . Cystoscopy with ureteroscopy 03/24/2011    Procedure: CYSTOSCOPY WITH URETEROSCOPY;  Surgeon: Garnett Farm, MD;  Location: [facility];  Service: Urology;  Laterality: Right;  dilitation of ureteral stricture    Family History: No family history on file.  Social History:  reports that she has never smoked. She has never used smokeless tobacco. She reports that she drinks about .6 ounces of alcohol per week. She reports that she does not use illicit drugs.  Allergies:  Allergies  Allergen Reactions  . Sulfa Antibiotics Hives    Medications: carvedilol (COREG) 12.5 MG tablet Sig - Route: Take 12.5 mg by mouth 2 (two) times daily with a meal. - Oral Class: Historical Med ciprofloxacin (CIPRO) 500 MG tablet 14 tablet 0 12/15/2011 12/25/2011 Sig - Route: Take 1 tablet (500 mg total) by mouth 2 (two) times daily. - Oral Class: Phone In dexamethasone (DECADRON) 4 MG tablet 30 tablet 1 11/08/2011 11/07/2012 Sig: Take 2 tablets by mouth daily starting the day after chemotherapy for 2 days. Take with food. Non-formulary Exception Code: Dosage strength/form digoxin (LANOXIN) 0.25 MG tablet  30 tablet 6 12/14/2011 Sig: TAKE 1 TABLET BY MOUTH EVERY DAY lisinopril (PRINIVIL,ZESTRIL) 10 MG tablet 60 tablet 6 11/08/2011 11/07/2012 Sig - Route: Take 1 tablet (10 mg total) by mouth 2 (two) times daily. - Oral LORazepam (ATIVAN) 0.5 MG tablet 30 tablet 0 11/08/2011 05/06/2012 Sig - Route: Take 1 tablet (0.5 mg total) by mouth every 6 (six) hours as needed (Nausea or vomiting). - Oral Class: Print ondansetron (ZOFRAN) 8 MG tablet 30 tablet 1 11/08/2011 11/07/2012 Sig: Take 1 tablet two times a day starting the day after chemo for 2 days. Then take 1 tablet two times a day as needed for nausea or vomiting. SODIUM CITRATE PO Sig - Route: Take 1 tablet by mouth 2 (two) times daily. - Oral Class: Historical Med Number of times this order has been changed since signing: 3 [address] spironolactone (ALDACTONE) 25 MG tablet Sig - Route: Take 25 mg by mouth daily. - Oral   Please HPI for pertinent positives, otherwise complete 10 system ROS negative.  Physical Exam: Blood pressure 104/68, pulse 78, temperature 97.8 F (36.6 C), height 5\' 4"  (1.626 m), weight 140 lb (63.504 kg), SpO2 96.00%. Body mass index is 24.03 kg/(m^2).   General Appearance:  Alert, cooperative, no distress, appears stated age  Head:  Normocephalic, without obvious abnormality, atraumatic  ENT: Unremarkable  Neck: Supple, symmetrical, trachea midline, no adenopathy, thyroid: not enlarged, symmetric, no tenderness/mass/nodules  Lungs:   Clear to auscultation bilaterally, no w/r/r, respirations unlabored without use  of accessory muscles.  Chest Wall:  No tenderness or deformity  Heart:  Regular rate and rhythm, S1, S2 normal, no murmur, rub or gallop. Carotids 2+ without bruit.  Abdomen:   Soft, non-tender, non distended. Bowel sounds active all four quadrants,  no masses, no organomegaly.  Neurologic: Normal affect, no gross deficits.   Labs: pending  Assessment/Plan Metastatic breast cancer, multiple liver lesions. Labs  pending Discussed US guided liver biopsy procedure, including risks and complications. Consent signed in chart  Brayton El PA-C 01/10/2012, 1:19 PM

## 2012-01-10 NOTE — Progress Notes (Signed)
DR Fredia Sorrow IN TO SEE CLIENT AND ORDERS NOTED

## 2012-01-10 NOTE — Progress Notes (Signed)
CLIENT STATES FEELS BETTER; NO C/O PAIN OR NAUSEA

## 2012-01-12 ENCOUNTER — Ambulatory Visit (HOSPITAL_COMMUNITY)
Admission: RE | Admit: 2012-01-12 | Discharge: 2012-01-12 | Disposition: A | Discharge status: Home / Self Care | Payer: BC Managed Care – PPO | Source: Ambulatory Visit | Attending: Internal Medicine | Admitting: Internal Medicine

## 2012-01-12 ENCOUNTER — Ambulatory Visit: Payer: Self-pay

## 2012-01-12 ENCOUNTER — Other Ambulatory Visit: Payer: Self-pay | Admitting: Lab

## 2012-01-12 ENCOUNTER — Encounter (HOSPITAL_COMMUNITY): Payer: Self-pay

## 2012-01-12 VITALS — BP 94/60 | HR 78 | Ht 64.0 in | Wt 143.4 lb

## 2012-01-12 DIAGNOSIS — R1011 Right upper quadrant pain: Secondary | ICD-10-CM | POA: Insufficient documentation

## 2012-01-12 DIAGNOSIS — I379 Nonrheumatic pulmonary valve disorder, unspecified: Secondary | ICD-10-CM

## 2012-01-12 DIAGNOSIS — C787 Secondary malignant neoplasm of liver and intrahepatic bile duct: Secondary | ICD-10-CM

## 2012-01-12 DIAGNOSIS — C50919 Malignant neoplasm of unspecified site of unspecified female breast: Secondary | ICD-10-CM | POA: Insufficient documentation

## 2012-01-12 DIAGNOSIS — I369 Nonrheumatic tricuspid valve disorder, unspecified: Secondary | ICD-10-CM | POA: Insufficient documentation

## 2012-01-12 DIAGNOSIS — I509 Heart failure, unspecified: Secondary | ICD-10-CM | POA: Insufficient documentation

## 2012-01-12 DIAGNOSIS — I5022 Chronic systolic (congestive) heart failure: Secondary | ICD-10-CM

## 2012-01-12 MED ORDER — IOHEXOL 300 MG/ML  SOLN
80.0000 mL | Freq: Once | INTRAMUSCULAR | Status: AC | PRN
  Administered 2012-01-12: 80 mL via INTRAVENOUS

## 2012-01-12 NOTE — Progress Notes (Signed)
Patient ID: Kathy Jimenez, female   DOB: 02-06-1971, 41 y.o.   MRN: [id number] HPI: Kathy Jimenez is a 41 y.o. female with L breast CA in 2004 s/p lumpectomy and treatment with FEC (flourouracil, epirubicin, cyclophosphamide). Also treated with hormonal therapy. 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  Started on weekly herceptin. Recently also started on Perjeta and Tykerb. Dr. Donnie Coffin evaluated her for a cough and progressive DOE; CT which showed dilated heart with pleural effusion and echo with EF 25% with global HK. Mod MR/TR, lateral s' 6.1 sec/m. Admitted to the hospital for volume overload and tachycardia.  Diuresed 9 lbs, discharge weight 151 lbs.  With history of severe kidney stones, lasix 20 mg given prn.    ECHOS: 03/23/11: EF 25-30%, lateral s' 6.4 sec/m, LVIDd improved from 4.9 to 3.4.  05/24/11: EF 45-50%, diffuse hypokinesis 06/16/11: EF 60-65% 08/17/11: EF 50%  11/08/11: EF 55-60% (reviewed by Dr Gala Romney) 01/12/12 EF 50-55% (reviewed by Dr Gala Romney)   01/10/12 liver biopsy   She is here for routine follow up today. Remains on Herceptin every week and every 3rd week and CMF.   Weight at home 130-140. Complains of RUQ pain associated with liver pain.  Denies SOB/PND/CP/Orthopnea. Denies lower extremity edema.  Compliant with medications.    ROS: All systems negative except as listed in HPI, PMH and Problem List.  Past Medical History  Diagnosis Date  . 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 Kidney stones     recurrent since 1989  . Endometrial polyp   . PONV (postoperative nausea and vomiting)   . CHF (congestive heart failure)     from chemo treatment    Current Outpatient Prescriptions  Medication Sig Dispense Refill  . carvedilol (COREG) 12.5 MG tablet Take 12.5 mg by mouth 2 (two) times daily with a meal.      . ciprofloxacin (CIPRO) 500 MG  tablet Take 1 tablet (500 mg total) by mouth 2 (two) times daily.  30 tablet  0  . dexamethasone (DECADRON) 4 MG tablet Take 2 tablets by mouth daily starting the day after chemotherapy for 2 days. Take with food.  30 tablet  1  . digoxin (LANOXIN) 0.25 MG tablet TAKE 1 TABLET BY MOUTH EVERY DAY  30 tablet  6  . lisinopril (PRINIVIL,ZESTRIL) 10 MG tablet Take 1 tablet (10 mg total) by mouth 2 (two) times daily.  60 tablet  6  . LORazepam (ATIVAN) 0.5 MG tablet Take 1 tablet (0.5 mg total) by mouth every 6 (six) hours as needed (Nausea or vomiting).  30 tablet  0  . ondansetron (ZOFRAN) 8 MG tablet Take 1 tablet two times a day starting the day after chemo for 2 days. Then take 1 tablet two times a day as needed for nausea or vomiting.  30 tablet  1  . SODIUM CITRATE PO Take 1 tablet by mouth 2 (two) times daily.       Marland Kitchen spironolactone (ALDACTONE) 25 MG tablet Take 25 mg by mouth daily.      Marland Kitchen DISCONTD: furosemide (LASIX) 20 MG tablet Take 1 tablet (20 mg total) by mouth daily as needed (If weight increases 3 pounds in 24 hours).  30 tablet  6     PHYSICAL EXAM: Filed Vitals:   01/12/12 0914  BP: 94/60  Pulse: 78  Height: 5'  4" (1.626 m)  Weight: 143 lb 6.4 oz (65.046 kg)  SpO2: 99%   General:  Well appearing.  No resp difficulty HEENT: normal Neck: supple. JVP flat. Carotids 2+ bilaterally; no bruits. No lymphadenopathy or thryomegaly appreciated. Cor: PMI normal. Regular rate & rhythm. No S3.  Trivial TR murmur.   Lungs: clear Abdomen: soft. nontender, nondistended. No hepatosplenomegaly. No bruits or masses. Liver fullness RUQ pain Extremities: no cyanosis, clubbing, rash, edema Neuro: alert & orientedx3, cranial nerves grossly intact. Moves all 4 extremities w/o difficulty. Affect pleasant.    ASSESSMENT & PLAN:

## 2012-01-12 NOTE — Assessment & Plan Note (Addendum)
Patient seen and examined with Tonye Becket, NP. We discussed all aspects of the encounter. I agree with the assessment as stated above. See my thoughts.   Reviewed ECHO results during office visit. EF reduced from 55-60% to 50%. Dr Gala Romney discussed ECHO results with Dr Donnie Coffin. Ok to continue Herceptin if tumor felt to be Herceptin receptive. Projeta may also be an option. If she continue on herceptin would repeat echo af 2 more cycles.

## 2012-01-12 NOTE — Patient Instructions (Addendum)
Follow up in 2 months with an ECHO  

## 2012-01-12 NOTE — Assessment & Plan Note (Signed)
She is exquisitely tender in RUQ after liver biopsy. Have discussed with radiology. Will check CT abdomen to rule out capsular hematoma.  Addendum: CT negative for hematoma. Offered narcotics for pain as needed. She has f/u with Dr. Donnie Coffin today.

## 2012-01-12 NOTE — Assessment & Plan Note (Addendum)
See above discussion. No clinical HF.

## 2012-01-15 ENCOUNTER — Other Ambulatory Visit (HOSPITAL_COMMUNITY): Payer: Self-pay

## 2012-01-18 NOTE — Telephone Encounter (Signed)
error 

## 2012-01-19 ENCOUNTER — Ambulatory Visit (HOSPITAL_BASED_OUTPATIENT_CLINIC_OR_DEPARTMENT_OTHER): Payer: Self-pay | Admitting: Lab

## 2012-01-19 ENCOUNTER — Ambulatory Visit (HOSPITAL_BASED_OUTPATIENT_CLINIC_OR_DEPARTMENT_OTHER): Payer: BC Managed Care – PPO

## 2012-01-19 ENCOUNTER — Other Ambulatory Visit: Payer: Self-pay | Admitting: Oncology

## 2012-01-19 DIAGNOSIS — C787 Secondary malignant neoplasm of liver and intrahepatic bile duct: Secondary | ICD-10-CM

## 2012-01-19 DIAGNOSIS — C7952 Secondary malignant neoplasm of bone marrow: Secondary | ICD-10-CM

## 2012-01-19 DIAGNOSIS — Z5112 Encounter for antineoplastic immunotherapy: Secondary | ICD-10-CM

## 2012-01-19 DIAGNOSIS — Z5111 Encounter for antineoplastic chemotherapy: Secondary | ICD-10-CM

## 2012-01-19 DIAGNOSIS — C801 Malignant (primary) neoplasm, unspecified: Secondary | ICD-10-CM

## 2012-01-19 DIAGNOSIS — C7951 Secondary malignant neoplasm of bone: Secondary | ICD-10-CM

## 2012-01-19 DIAGNOSIS — C50919 Malignant neoplasm of unspecified site of unspecified female breast: Secondary | ICD-10-CM

## 2012-01-19 LAB — CBC WITH DIFFERENTIAL/PLATELET
BASO%: 0.8 % (ref 0.0–2.0)
Basophils Absolute: 0.1 10*3/uL (ref 0.0–0.1)
EOS%: 0.5 % (ref 0.0–7.0)
HCT: 32.3 % — ABNORMAL LOW (ref 34.8–46.6)
HGB: 11.2 g/dL — ABNORMAL LOW (ref 11.6–15.9)
MCH: 31.9 pg (ref 25.1–34.0)
MONO#: 1 10*3/uL — ABNORMAL HIGH (ref 0.1–0.9)
NEUT#: 4.5 10*3/uL (ref 1.5–6.5)
RDW: 14.6 % — ABNORMAL HIGH (ref 11.2–14.5)
WBC: 7 10*3/uL (ref 3.9–10.3)
lymph#: 1.4 10*3/uL (ref 0.9–3.3)

## 2012-01-19 LAB — COMPREHENSIVE METABOLIC PANEL
AST: 50 U/L — ABNORMAL HIGH (ref 0–37)
Albumin: 3.5 g/dL (ref 3.5–5.2)
Alkaline Phosphatase: 110 U/L (ref 39–117)
Calcium: 9.4 mg/dL (ref 8.4–10.5)
Chloride: 105 mEq/L (ref 96–112)
Glucose, Bld: 93 mg/dL (ref 70–99)
Potassium: 4.1 mEq/L (ref 3.5–5.3)
Sodium: 140 mEq/L (ref 135–145)
Total Protein: 7.6 g/dL (ref 6.0–8.3)

## 2012-01-19 MED ORDER — DEXAMETHASONE SODIUM PHOSPHATE 10 MG/ML IJ SOLN
10.0000 mg | Freq: Once | INTRAMUSCULAR | Status: AC
  Administered 2012-01-19: 5 mg via INTRAVENOUS

## 2012-01-19 MED ORDER — DENOSUMAB 120 MG/1.7ML ~~LOC~~ SOLN
120.0000 mg | Freq: Once | SUBCUTANEOUS | Status: AC
  Administered 2012-01-19: 120 mg via SUBCUTANEOUS
  Filled 2012-01-19: qty 1.7

## 2012-01-19 MED ORDER — SODIUM CHLORIDE 0.9 % IV SOLN: Freq: Once | INTRAVENOUS | Status: DC

## 2012-01-19 MED ORDER — FULVESTRANT 250 MG/5ML IM SOLN
500.0000 mg | Freq: Once | INTRAMUSCULAR | Status: AC
  Administered 2012-01-19: 500 mg via INTRAMUSCULAR
  Filled 2012-01-19: qty 10

## 2012-01-19 MED ORDER — ONDANSETRON 8 MG/50ML IVPB (CHCC)
8.0000 mg | Freq: Once | INTRAVENOUS | Status: AC
  Administered 2012-01-19: 8 mg via INTRAVENOUS

## 2012-01-19 MED ORDER — HEPARIN SOD (PORK) LOCK FLUSH 100 UNIT/ML IV SOLN
500.0000 [IU] | Freq: Once | INTRAVENOUS | Status: AC | PRN
  Administered 2012-01-19: 500 [IU]
  Filled 2012-01-19: qty 5

## 2012-01-19 MED ORDER — SODIUM CHLORIDE 0.9 % IJ SOLN
10.0000 mL | INTRAMUSCULAR | Status: DC | PRN
  Filled 2012-01-19: qty 10

## 2012-01-19 MED ORDER — HEPARIN SOD (PORK) LOCK FLUSH 100 UNIT/ML IV SOLN
500.0000 [IU] | Freq: Once | INTRAVENOUS | Status: DC | PRN
  Filled 2012-01-19: qty 5

## 2012-01-19 MED ORDER — CIPROFLOXACIN HCL 500 MG PO TABS: 500.0000 mg | ORAL_TABLET | Freq: Two times a day (BID) | ORAL | Status: DC

## 2012-01-19 MED ORDER — SODIUM CHLORIDE 0.9 % IJ SOLN
10.0000 mL | INTRAMUSCULAR | Status: DC | PRN
  Administered 2012-01-19: 10 mL
  Filled 2012-01-19: qty 10

## 2012-01-19 MED ORDER — SODIUM CHLORIDE 0.9 % IV SOLN
Freq: Once | INTRAVENOUS | Status: AC
  Administered 2012-01-19: 17:00:00 via INTRAVENOUS

## 2012-01-19 MED ORDER — ACETAMINOPHEN 325 MG PO TABS
650.0000 mg | ORAL_TABLET | Freq: Once | ORAL | Status: AC
  Administered 2012-01-19: 650 mg via ORAL

## 2012-01-19 MED ORDER — METHOTREXATE SODIUM CHEMO INJECTION 25 MG/ML
40.0000 mg/m2 | Freq: Once | INTRAMUSCULAR | Status: AC
  Administered 2012-01-19: 67.5 mg via INTRAVENOUS
  Filled 2012-01-19: qty 2.7

## 2012-01-19 MED ORDER — SODIUM CHLORIDE 0.9 % IV SOLN
600.0000 mg/m2 | Freq: Once | INTRAVENOUS | Status: AC
  Administered 2012-01-19: 1020 mg via INTRAVENOUS
  Filled 2012-01-19: qty 51

## 2012-01-19 MED ORDER — FLUOROURACIL CHEMO INJECTION 2.5 GM/50ML
600.0000 mg/m2 | Freq: Once | INTRAVENOUS | Status: AC
  Administered 2012-01-19: 1050 mg via INTRAVENOUS
  Filled 2012-01-19: qty 21

## 2012-01-19 MED ORDER — TRASTUZUMAB CHEMO INJECTION 440 MG
2.0000 mg/kg | Freq: Once | INTRAVENOUS | Status: AC
  Administered 2012-01-19 (×2): 126 mg via INTRAVENOUS
  Filled 2012-01-19: qty 6

## 2012-01-19 NOTE — Patient Instructions (Signed)
[facility] Discharge Instructions for Patients Receiving Chemotherapy  Today you received the following chemotherapy agents Faslodex / CMF/ Xgeva To help prevent nausea and vomiting after your treatment, we encourage you to take your nausea medication as directed  If you develop nausea and vomiting that is not controlled by your nausea medication, call the clinic. If it is after clinic hours your family physician or the after hours number for the clinic or go to the Emergency Department.   BELOW ARE SYMPTOMS THAT SHOULD BE REPORTED IMMEDIATELY:  *FEVER GREATER THAN 100.5 F  *CHILLS WITH OR WITHOUT FEVER  NAUSEA AND VOMITING THAT IS NOT CONTROLLED WITH YOUR NAUSEA MEDICATION  *UNUSUAL SHORTNESS OF BREATH  *UNUSUAL BRUISING OR BLEEDING  TENDERNESS IN MOUTH AND THROAT WITH OR WITHOUT PRESENCE OF ULCERS  *URINARY PROBLEMS  *BOWEL PROBLEMS  UNUSUAL RASH Items with * indicate a potential emergency and should be followed up as soon as possible.  One of the nurses will contact you 24 hours after your treatment. Please let the nurse know about any problems that you may have experienced. Feel free to call the clinic you have any questions or concerns. The clinic phone number is [phone].   I have been informed and understand all the instructions given to me. I know to contact the clinic, my physician, or go to the Emergency Department if any problems should occur. I do not have any questions at this time, but understand that I may call the clinic during office hours   should I have any questions or need assistance in obtaining follow up care.    __________________________________________  _____________  __________ Signature of Patient or Authorized Representative            Date                   Time    __________________________________________ Nurse's Signature

## 2012-01-22 ENCOUNTER — Other Ambulatory Visit (HOSPITAL_COMMUNITY): Payer: Self-pay | Admitting: *Deleted

## 2012-01-22 MED ORDER — CARVEDILOL 12.5 MG PO TABS: 12.5000 mg | ORAL_TABLET | Freq: Two times a day (BID) | ORAL | Status: DC

## 2012-01-24 MED ORDER — EXEMESTANE 25 MG PO TABS: 25.0000 mg | ORAL_TABLET | Freq: Every day | ORAL | Status: DC

## 2012-01-25 ENCOUNTER — Telehealth: Payer: Self-pay | Admitting: *Deleted

## 2012-01-25 ENCOUNTER — Other Ambulatory Visit: Payer: Self-pay | Admitting: *Deleted

## 2012-01-25 DIAGNOSIS — C50919 Malignant neoplasm of unspecified site of unspecified female breast: Secondary | ICD-10-CM

## 2012-01-25 NOTE — Telephone Encounter (Signed)
Per staff message and POF I have scheduled appts. JWM  

## 2012-01-25 NOTE — Telephone Encounter (Signed)
01-26-2012 starting at 3:00pm  Sent email to [facility] to set up treatment for 01-26-2012

## 2012-01-26 ENCOUNTER — Ambulatory Visit (HOSPITAL_BASED_OUTPATIENT_CLINIC_OR_DEPARTMENT_OTHER): Payer: BC Managed Care – PPO

## 2012-01-26 ENCOUNTER — Other Ambulatory Visit (HOSPITAL_BASED_OUTPATIENT_CLINIC_OR_DEPARTMENT_OTHER): Payer: BC Managed Care – PPO | Admitting: Lab

## 2012-01-26 VITALS — BP 110/77 | HR 88 | Temp 98.9°F | Resp 18

## 2012-01-26 DIAGNOSIS — C787 Secondary malignant neoplasm of liver and intrahepatic bile duct: Secondary | ICD-10-CM

## 2012-01-26 DIAGNOSIS — C801 Malignant (primary) neoplasm, unspecified: Secondary | ICD-10-CM

## 2012-01-26 DIAGNOSIS — C50919 Malignant neoplasm of unspecified site of unspecified female breast: Secondary | ICD-10-CM

## 2012-01-26 DIAGNOSIS — C7951 Secondary malignant neoplasm of bone: Secondary | ICD-10-CM

## 2012-01-26 DIAGNOSIS — C7952 Secondary malignant neoplasm of bone marrow: Secondary | ICD-10-CM

## 2012-01-26 DIAGNOSIS — Z5112 Encounter for antineoplastic immunotherapy: Secondary | ICD-10-CM

## 2012-01-26 LAB — COMPREHENSIVE METABOLIC PANEL (CC13)
ALT: 20 U/L (ref 0–55)
AST: 35 U/L — ABNORMAL HIGH (ref 5–34)
Alkaline Phosphatase: 114 U/L (ref 40–150)
Sodium: 138 mEq/L (ref 136–145)
Total Bilirubin: 0.5 mg/dL (ref 0.20–1.20)
Total Protein: 7.1 g/dL (ref 6.4–8.3)

## 2012-01-26 LAB — CBC WITH DIFFERENTIAL/PLATELET
BASO%: 0.8 % (ref 0.0–2.0)
EOS%: 8.1 % — ABNORMAL HIGH (ref 0.0–7.0)
LYMPH%: 24 % (ref 14.0–49.7)
MCHC: 35 g/dL (ref 31.5–36.0)
MCV: 90.5 fL (ref 79.5–101.0)
MONO%: 3 % (ref 0.0–14.0)
Platelets: 202 10*3/uL (ref 145–400)
RBC: 3.61 10*6/uL — ABNORMAL LOW (ref 3.70–5.45)

## 2012-01-26 MED ORDER — ACETAMINOPHEN 325 MG PO TABS
650.0000 mg | ORAL_TABLET | Freq: Once | ORAL | Status: AC
  Administered 2012-01-26: 650 mg via ORAL

## 2012-01-26 MED ORDER — GOSERELIN ACETATE 3.6 MG ~~LOC~~ IMPL
3.6000 mg | DRUG_IMPLANT | Freq: Once | SUBCUTANEOUS | Status: AC
  Administered 2012-01-26: 3.6 mg via SUBCUTANEOUS
  Filled 2012-01-26: qty 3.6

## 2012-01-26 MED ORDER — TRASTUZUMAB CHEMO INJECTION 440 MG
2.0000 mg/kg | Freq: Once | INTRAVENOUS | Status: AC
  Administered 2012-01-26: 126 mg via INTRAVENOUS
  Filled 2012-01-26: qty 6

## 2012-01-26 MED ORDER — DIPHENHYDRAMINE HCL 25 MG PO CAPS: 50.0000 mg | ORAL_CAPSULE | Freq: Once | ORAL | Status: DC

## 2012-01-26 MED ORDER — SODIUM CHLORIDE 0.9 % IV SOLN
Freq: Once | INTRAVENOUS | Status: AC
  Administered 2012-01-26: 16:00:00 via INTRAVENOUS

## 2012-01-26 MED ORDER — SODIUM CHLORIDE 0.9 % IJ SOLN
10.0000 mL | INTRAMUSCULAR | Status: DC | PRN
  Administered 2012-01-26: 10 mL
  Filled 2012-01-26: qty 10

## 2012-01-26 MED ORDER — HEPARIN SOD (PORK) LOCK FLUSH 100 UNIT/ML IV SOLN
500.0000 [IU] | Freq: Once | INTRAVENOUS | Status: AC | PRN
  Administered 2012-01-26: 500 [IU]
  Filled 2012-01-26: qty 5

## 2012-02-02 ENCOUNTER — Ambulatory Visit (HOSPITAL_BASED_OUTPATIENT_CLINIC_OR_DEPARTMENT_OTHER): Payer: BC Managed Care – PPO

## 2012-02-02 ENCOUNTER — Other Ambulatory Visit (HOSPITAL_BASED_OUTPATIENT_CLINIC_OR_DEPARTMENT_OTHER): Payer: BC Managed Care – PPO | Admitting: Lab

## 2012-02-02 VITALS — BP 109/68 | HR 85 | Temp 98.2°F

## 2012-02-02 DIAGNOSIS — C801 Malignant (primary) neoplasm, unspecified: Secondary | ICD-10-CM

## 2012-02-02 DIAGNOSIS — C50919 Malignant neoplasm of unspecified site of unspecified female breast: Secondary | ICD-10-CM

## 2012-02-02 DIAGNOSIS — C7952 Secondary malignant neoplasm of bone marrow: Secondary | ICD-10-CM

## 2012-02-02 DIAGNOSIS — Z5112 Encounter for antineoplastic immunotherapy: Secondary | ICD-10-CM

## 2012-02-02 DIAGNOSIS — C787 Secondary malignant neoplasm of liver and intrahepatic bile duct: Secondary | ICD-10-CM

## 2012-02-02 LAB — CBC WITH DIFFERENTIAL/PLATELET
BASO%: 1.9 % (ref 0.0–2.0)
HCT: 32.6 % — ABNORMAL LOW (ref 34.8–46.6)
LYMPH%: 56.5 % — ABNORMAL HIGH (ref 14.0–49.7)
MCH: 32.1 pg (ref 25.1–34.0)
MCHC: 35.3 g/dL (ref 31.5–36.0)
MONO#: 0.4 10*3/uL (ref 0.1–0.9)
NEUT%: 10.3 % — ABNORMAL LOW (ref 38.4–76.8)
Platelets: 173 10*3/uL (ref 145–400)
WBC: 1.8 10*3/uL — ABNORMAL LOW (ref 3.9–10.3)

## 2012-02-02 MED ORDER — TRASTUZUMAB CHEMO INJECTION 440 MG
2.0000 mg/kg | Freq: Once | INTRAVENOUS | Status: AC
  Administered 2012-02-02: 126 mg via INTRAVENOUS
  Filled 2012-02-02: qty 6

## 2012-02-02 MED ORDER — SODIUM CHLORIDE 0.9 % IV SOLN
Freq: Once | INTRAVENOUS | Status: AC
  Administered 2012-02-02: 16:00:00 via INTRAVENOUS

## 2012-02-02 MED ORDER — HEPARIN SOD (PORK) LOCK FLUSH 100 UNIT/ML IV SOLN
500.0000 [IU] | Freq: Once | INTRAVENOUS | Status: AC | PRN
  Administered 2012-02-02: 500 [IU]
  Filled 2012-02-02: qty 5

## 2012-02-02 MED ORDER — ACETAMINOPHEN 325 MG PO TABS
650.0000 mg | ORAL_TABLET | Freq: Once | ORAL | Status: AC
  Administered 2012-02-02: 650 mg via ORAL

## 2012-02-02 MED ORDER — DIPHENHYDRAMINE HCL 25 MG PO CAPS: 50.0000 mg | ORAL_CAPSULE | Freq: Once | ORAL | Status: DC

## 2012-02-02 MED ORDER — SODIUM CHLORIDE 0.9 % IJ SOLN
10.0000 mL | INTRAMUSCULAR | Status: DC | PRN
  Administered 2012-02-02: 10 mL
  Filled 2012-02-02: qty 10

## 2012-02-02 NOTE — Progress Notes (Signed)
Anc 0.2 ok to treat per Dr.Rubin

## 2012-02-02 NOTE — Patient Instructions (Addendum)
[facility] Discharge Instructions for Patients Receiving Chemotherapy  Today you received the following chemotherapy agents Herceptin To help prevent nausea and vomiting after your treatment, we encourage you to take your nausea medication as prescribed.  If you develop nausea and vomiting that is not controlled by your nausea medication, call the clinic. If it is after clinic hours your family physician or the after hours number for the clinic or go to the Emergency Department.   BELOW ARE SYMPTOMS THAT SHOULD BE REPORTED IMMEDIATELY:  *FEVER GREATER THAN 100.5 F  *CHILLS WITH OR WITHOUT FEVER  NAUSEA AND VOMITING THAT IS NOT CONTROLLED WITH YOUR NAUSEA MEDICATION  *UNUSUAL SHORTNESS OF BREATH  *UNUSUAL BRUISING OR BLEEDING  TENDERNESS IN MOUTH AND THROAT WITH OR WITHOUT PRESENCE OF ULCERS  *URINARY PROBLEMS  *BOWEL PROBLEMS  UNUSUAL RASH Items with * indicate a potential emergency and should be followed up as soon as possible.  One of the nurses will contact you 24 hours after your treatment. Please let the nurse know about any problems that you may have experienced. Feel free to call the clinic you have any questions or concerns. The clinic phone number is [phone].   I have been informed and understand all the instructions given to me. I know to contact the clinic, my physician, or go to the Emergency Department if any problems should occur. I do not have any questions at this time, but understand that I may call the clinic during office hours   should I have any questions or need assistance in obtaining follow up care.    __________________________________________  _____________  __________ Signature of Patient or Authorized Representative            Date                   Time    __________________________________________ Nurse's Signature    

## 2012-02-05 ENCOUNTER — Other Ambulatory Visit: Payer: Self-pay | Admitting: Emergency Medicine

## 2012-02-05 ENCOUNTER — Other Ambulatory Visit: Payer: Self-pay | Admitting: Oncology

## 2012-02-05 ENCOUNTER — Telehealth: Payer: Self-pay | Admitting: *Deleted

## 2012-02-05 ENCOUNTER — Ambulatory Visit: Payer: BC Managed Care – PPO | Admitting: Lab

## 2012-02-05 ENCOUNTER — Other Ambulatory Visit (HOSPITAL_BASED_OUTPATIENT_CLINIC_OR_DEPARTMENT_OTHER): Payer: BC Managed Care – PPO | Admitting: Lab

## 2012-02-05 DIAGNOSIS — C50919 Malignant neoplasm of unspecified site of unspecified female breast: Secondary | ICD-10-CM

## 2012-02-05 DIAGNOSIS — C787 Secondary malignant neoplasm of liver and intrahepatic bile duct: Secondary | ICD-10-CM

## 2012-02-05 LAB — CBC WITH DIFFERENTIAL/PLATELET
EOS%: 4.6 % (ref 0.0–7.0)
Eosinophils Absolute: 0.1 10*3/uL (ref 0.0–0.5)
LYMPH%: 35 % (ref 14.0–49.7)
MCH: 32.2 pg (ref 25.1–34.0)
MCV: 90.6 fL (ref 79.5–101.0)
MONO%: 38.1 % — ABNORMAL HIGH (ref 0.0–14.0)
NEUT#: 0.5 10*3/uL — CL (ref 1.5–6.5)
Platelets: 164 10*3/uL (ref 145–400)
RBC: 3.86 10*6/uL (ref 3.70–5.45)

## 2012-02-05 NOTE — Telephone Encounter (Signed)
Per staff message and POF I have scheduled appt.  JMW  

## 2012-02-05 NOTE — Telephone Encounter (Signed)
Per desk RN I have scheduled appts for this Friday.  JMW

## 2012-02-05 NOTE — Telephone Encounter (Signed)
Add on 02-16-2012 starting at 8:15am with labs  Sent Kathy Jimenez email to set up patient for treatment on 02-16-2012

## 2012-02-07 ENCOUNTER — Other Ambulatory Visit: Payer: Self-pay | Admitting: Emergency Medicine

## 2012-02-07 ENCOUNTER — Other Ambulatory Visit (HOSPITAL_BASED_OUTPATIENT_CLINIC_OR_DEPARTMENT_OTHER): Payer: BC Managed Care – PPO | Admitting: Lab

## 2012-02-07 DIAGNOSIS — C787 Secondary malignant neoplasm of liver and intrahepatic bile duct: Secondary | ICD-10-CM

## 2012-02-07 DIAGNOSIS — C50919 Malignant neoplasm of unspecified site of unspecified female breast: Secondary | ICD-10-CM

## 2012-02-07 LAB — CBC WITH DIFFERENTIAL/PLATELET
BASO%: 0.7 % (ref 0.0–2.0)
LYMPH%: 23 % (ref 14.0–49.7)
MCHC: 35 g/dL (ref 31.5–36.0)
MCV: 91 fL (ref 79.5–101.0)
MONO%: 27 % — ABNORMAL HIGH (ref 0.0–14.0)
Platelets: 152 10*3/uL (ref 145–400)
RBC: 3.73 10*6/uL (ref 3.70–5.45)
RDW: 14.2 % (ref 11.2–14.5)
WBC: 4.5 10*3/uL (ref 3.9–10.3)

## 2012-02-09 ENCOUNTER — Ambulatory Visit (HOSPITAL_BASED_OUTPATIENT_CLINIC_OR_DEPARTMENT_OTHER): Payer: BC Managed Care – PPO

## 2012-02-09 ENCOUNTER — Other Ambulatory Visit (HOSPITAL_BASED_OUTPATIENT_CLINIC_OR_DEPARTMENT_OTHER): Payer: BC Managed Care – PPO | Admitting: Lab

## 2012-02-09 DIAGNOSIS — C7951 Secondary malignant neoplasm of bone: Secondary | ICD-10-CM

## 2012-02-09 DIAGNOSIS — C7952 Secondary malignant neoplasm of bone marrow: Secondary | ICD-10-CM

## 2012-02-09 DIAGNOSIS — C50919 Malignant neoplasm of unspecified site of unspecified female breast: Secondary | ICD-10-CM

## 2012-02-09 DIAGNOSIS — Z5112 Encounter for antineoplastic immunotherapy: Secondary | ICD-10-CM

## 2012-02-09 DIAGNOSIS — C787 Secondary malignant neoplasm of liver and intrahepatic bile duct: Secondary | ICD-10-CM

## 2012-02-09 DIAGNOSIS — C801 Malignant (primary) neoplasm, unspecified: Secondary | ICD-10-CM

## 2012-02-09 LAB — CBC WITH DIFFERENTIAL/PLATELET
BASO%: 0.6 % (ref 0.0–2.0)
MCHC: 35.5 g/dL (ref 31.5–36.0)
MONO#: 0.9 10*3/uL (ref 0.1–0.9)
NEUT#: 2.9 10*3/uL (ref 1.5–6.5)
RBC: 3.68 10*6/uL — ABNORMAL LOW (ref 3.70–5.45)
RDW: 14.5 % (ref 11.2–14.5)
WBC: 5.1 10*3/uL (ref 3.9–10.3)
lymph#: 1.1 10*3/uL (ref 0.9–3.3)

## 2012-02-09 MED ORDER — HEPARIN SOD (PORK) LOCK FLUSH 100 UNIT/ML IV SOLN
500.0000 [IU] | Freq: Once | INTRAVENOUS | Status: AC | PRN
  Administered 2012-02-09: 500 [IU]
  Filled 2012-02-09: qty 5

## 2012-02-09 MED ORDER — SODIUM CHLORIDE 0.9 % IJ SOLN
10.0000 mL | INTRAMUSCULAR | Status: DC | PRN
  Administered 2012-02-09: 10 mL
  Filled 2012-02-09: qty 10

## 2012-02-09 MED ORDER — GOSERELIN ACETATE 3.6 MG ~~LOC~~ IMPL: 3.6000 mg | DRUG_IMPLANT | Freq: Once | SUBCUTANEOUS | Status: DC

## 2012-02-09 MED ORDER — ACETAMINOPHEN 325 MG PO TABS
650.0000 mg | ORAL_TABLET | Freq: Once | ORAL | Status: AC
  Administered 2012-02-09: 650 mg via ORAL

## 2012-02-09 MED ORDER — SODIUM CHLORIDE 0.9 % IV SOLN
Freq: Once | INTRAVENOUS | Status: AC
  Administered 2012-02-09: 09:00:00 via INTRAVENOUS

## 2012-02-09 MED ORDER — TRASTUZUMAB CHEMO INJECTION 440 MG
2.0000 mg/kg | Freq: Once | INTRAVENOUS | Status: AC
  Administered 2012-02-09: 126 mg via INTRAVENOUS
  Filled 2012-02-09: qty 6

## 2012-02-09 NOTE — Patient Instructions (Signed)

## 2012-02-09 NOTE — Progress Notes (Signed)
Hematology and Oncology Follow Up Visit  Kathy Jimenez [id number] [birth year] 41 y.o. 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 She is is been treated with Herceptin based therapy including Which Was Discontinued after 4 Cycles for Progression of Disease. She Is Done CMF Every 3 Weeks with Herceptin I Once Again This Was Discontinued after 4 Cycles with Evidence of Some Degree of Progression. She Is Currently Status Post Second Liver Biopsy Which Did in Fact Show Borderline HER-2 Expression. As a Result She Is Receiving Aromasin /affinitor. Dose of affinitor is 2.5 mg every other day. The dose was decreased when a interaction with suspected in her other medications and a little lower white count. More recently her white count has improved. She is here today to receive weekly Herceptin which will continue for the time being I've also discussion with radiology regarding possible y90  Infusion for management of her liver metastases. Interim History:   In general she feels well she is active she is continues to work full-time. She really denies abdominal pain. She was having low-grade fever and sore throat when she was on a higher dose of affinitor. This has since improved. She's also been on prophylactic Cipro.   Review of Systems: Constitutional:  no weight loss, fever, night sweats and feels well Eyes: no complaints ENT: no complaints Cardiovascular: no chest pain or dyspnea on exertion Respiratory: no cough, shortness of breath, or wheezing Neurological: no TIA or stroke symptoms Dermatological: negative Gastrointestinal: no abdominal pain, change in bowel habits, or black or bloody stools Genito-Urinary: no dysuria, trouble voiding, or hematuria Hematological and Lymphatic: negative Breast: negative Musculoskeletal: negative Remaining ROS negative.   Medications:   I have reviewed the patient's current medications.  Current Outpatient Prescriptions  Medication Sig Dispense Refill  . carvedilol (COREG) 12.5 MG tablet Take 1 tablet (12.5 mg total) by mouth 2 (two) times daily with a meal.  60 tablet  6  . ciprofloxacin (CIPRO) 500 MG tablet Take 1 tablet (500 mg total) by mouth 2 (two) times daily.  30 tablet  0  . dexamethasone (DECADRON) 4 MG tablet Take 2 tablets by mouth daily starting the day after chemotherapy for 2 days. Take with food.  30 tablet  1  . everolimus (AFINITOR) 2.5 MG tablet Take 2.5 mg by mouth daily. Caution:chemotherapy.      Marland Kitchen exemestane (AROMASIN) 25 MG tablet Take 1 tablet (25 mg total) by mouth daily after breakfast.  30 tablet  3  . lisinopril (PRINIVIL,ZESTRIL) 10 MG tablet Take 1 tablet (10 mg total) by mouth 2 (two) times daily.  60 tablet  6  . LORazepam (ATIVAN) 0.5 MG tablet Take 1 tablet (0.5 mg total) by mouth every 6 (six) hours as needed (Nausea or vomiting).  30 tablet  0  . ondansetron (ZOFRAN) 8 MG tablet Take 1 tablet two times a day starting the day after chemo for 2 days. Then take 1 tablet two times a day as needed for nausea or vomiting.  30 tablet  1  . SODIUM CITRATE PO Take 1 tablet by mouth 2 (two) times daily.       Marland Kitchen spironolactone (ALDACTONE) 25 MG tablet Take 25 mg by mouth daily.      Marland Kitchen DISCONTD: furosemide (LASIX) 20 MG tablet Take 1 tablet (20 mg total) by mouth daily as needed (If weight increases 3 pounds in 24 hours).  30 tablet  6   Current Facility-Administered Medications  Medication Dose Route Frequency Provider Last Rate Last Dose  . goserelin (ZOLADEX) injection 3.6 mg  3.6 mg Subcutaneous Once Pierce Crane, MD        Allergies:  Allergies  Allergen Reactions  . Sulfa Antibiotics Hives    Physical Exam: There were no vitals filed for this visit.  There is no height or weight on file to calculate BMI. Weight: 141 lbs. HEENT:  Sclerae anicteric, conjunctivae pink.  Oropharynx clear.  No  mucositis or candidiasis.   Nodes:  No cervical, supraclavicular, or axillary lymphadenopathy palpated.   Lungs:  Clear to auscultation bilaterally.  No crackles, rhonchi, or wheezes.   Heart:  Regular rate and rhythm.   Abdomen:  Abdomen is soft, nontender, without evidence of hepatomegaly. Normal bowel sounds are present. No evidence of distention. Musculoskeletal:  No focal spinal tenderness to palpation.  Extremities:  Benign.  No peripheral edema or cyanosis.   Skin:  Benign.   Neuro:  Nonfocal, alert and oriented x 3.   Lab Results: Lab Results  Component Value Date   WBC 5.1 02/09/2012   HGB 11.8 02/09/2012   HCT 33.2* 02/09/2012   MCV 90.2 02/09/2012   PLT 128* 02/09/2012   NEUTROABS 2.9 02/09/2012     Chemistry      Component Value Date/Time   NA 138 01/26/2012 1512   NA 140 01/19/2012 1603   NA 141 09/02/2010 1134   K 4.2 01/26/2012 1512   K 4.1 01/19/2012 1603   K 4.3 09/02/2010 1134   CL 107 01/26/2012 1512   CL 105 01/19/2012 1603   CL 103 09/02/2010 1134   CO2 22 01/26/2012 1512   CO2 28 01/19/2012 1603   CO2 25 09/02/2010 1134   BUN 21.0 01/26/2012 1512   BUN 17 01/19/2012 1603   BUN 11 09/02/2010 1134   CREATININE 0.9 01/26/2012 1512   CREATININE 0.92 01/19/2012 1603   CREATININE 0.7 09/02/2010 1134      Component Value Date/Time   CALCIUM 9.8 01/26/2012 1512   CALCIUM 9.4 01/19/2012 1603   CALCIUM 8.4 09/02/2010 1134   ALKPHOS 114 01/26/2012 1512   ALKPHOS 110 01/19/2012 1603   ALKPHOS 84 09/02/2010 1134   AST 35* 01/26/2012 1512   AST 50* 01/19/2012 1603   AST 27 09/02/2010 1134   ALT 20 01/26/2012 1512   ALT 20 01/19/2012 1603   BILITOT 0.50 01/26/2012 1512   BILITOT 0.4 01/19/2012 1603   BILITOT 0.60 09/02/2010 1134      Lab Results  Component Value Date   LABCA2 197* 01/19/2012     D.     Assessment:  Kathy Jimenez is a 41 year old [place] woman with a metastatic ER/HER-2 positive breast carcinoma with known bone and liver  involvement. Currently she is receiving Aromasin, Herceptin, xgeva   and Zoladex .   CHF: followed closely by Dr. Gala Romney. Recent ECHO fairly stable per Cardiology note, upcoming follow up appointment on 11/07/11      Plan:  Patient is doing fairly well. Current plan is to continue the present regimen we will get an input from radiology regarding possible y90 infusion. I think her disease is slowly progressive that. I think that Herceptin based therapy may be effective I think there is a lot of heterogeneity in her disease. Therefore may help to make her disease more sensitive to both antiestrogen and anti-HER-2 type therapies. Plan 1 discussions about her goals of care.  She really wants keep and maintain her quality of life. She is less interested in pursuing aggressive chemotherapies which would make her sick. Pierce Crane, MD

## 2012-02-12 ENCOUNTER — Other Ambulatory Visit: Payer: Self-pay | Admitting: Certified Registered Nurse Anesthetist

## 2012-02-13 ENCOUNTER — Ambulatory Visit
Admission: RE | Admit: 2012-02-13 | Discharge: 2012-02-13 | Disposition: A | Discharge status: Home / Self Care | Payer: BC Managed Care – PPO | Source: Ambulatory Visit | Attending: Oncology | Admitting: Oncology

## 2012-02-13 ENCOUNTER — Other Ambulatory Visit: Payer: Self-pay | Admitting: Interventional Radiology

## 2012-02-13 DIAGNOSIS — C50919 Malignant neoplasm of unspecified site of unspecified female breast: Secondary | ICD-10-CM

## 2012-02-13 DIAGNOSIS — C787 Secondary malignant neoplasm of liver and intrahepatic bile duct: Secondary | ICD-10-CM

## 2012-02-14 NOTE — Progress Notes (Signed)
Patient ID: Kathy Jimenez, female   DOB: 09/29/1970, 41 y.o.   MRN: [id number]  ESTABLISHED PATIENT OFFICE VISIT  Chief Complaint: Metastatic breast carcinoma to the liver.  History: The patient is well known to me from prior liver biopsy and port placement. She is a 41 year old female with a history of metastatic breast carcinoma to the liver and skeleton. She was initially diagnosed with left sided breast cancer in 2004. After a period of remission, she presented in April, 2012 with metastatic disease to the liver, sternum, pelvis and L1 vertebral body. Biopsy confired ER/HER-2 positive disease. The patient has been treated with a variety of different chemotherapeutic agents. Initial Herceptin, Perjeta and hormonal blockade therapy was complicated by disease progression and heart failure. Bony metastatic disease has shown some response to treatment by PET scan. However, there has been continued progression of metastatic disease of the liver, particularly in the left lobe of the liver. This led to repeat biopsy of the liver on 01/10/2012. Current chemotherapeutic regimen consists of Herceptin every Friday and Aromasin/Affinitor every other day.  Review of Systems: The patient does have some midline epigastric abdominal pain with associated nausea. She denies fever, chills, vomiting or diarrhea. She is independent in activities of daily living and continues to work during treatment. She has some fatigue chronically on chemotherapy. She currently denies any significant shortness of breath.  Exam: Vital signs: Blood pressure 97/74, pulse 81, respirations 14, temperature 98.1, oxygen saturation 100% on room air. General: No acute distress. Skin: No jaundice or lesions. Chest: Clear auscultation bilaterally. Heart: Regular rate rhythm. No audible murmurs. Abdomen: Mild tenderness in the midline epigastric region to the deep palpation. No rebound tenderness. No distention.  Extremities: No edema.  Labs: Sodium 138, potassium 4.2, BUN 21, creatinine 0.9, total bilirubin 0.5, alkaline phosphatase 114, AST 35, ALT 20, total protein 7.1, albumin 3.6, calcium 9.8, WBC 5.1, hemoglobin 11.8, hematocrit 33.2, platelet count 128, PET 14.3, INR 1.1, PTT 32, CA 27.29 197.  Imaging: The most recent abdominal CT study dated 01/12/2012 demonstrated progressive metastatic disease in the liver since prior CT in July. Dominant disease is within the lateral segment of the left lobe. Enlarging lesions were also identified in the medial segment of the left lobe. Some of the lesions are at the margin of right and left lobes.  Assessment and Plan: Ms. Ethridge came today at the request of Dr. Donnie Coffin to discuss potential Yttrium-90 radioembolization treatment as an adjunct to current chemotherapy to treat progressive and resistant metastatic disease in the liver. The patient's current performance status is excellent and ECOG 0. Her liver function tests are normal with normal bilirubin. Disease is almost entirely confined to the left lobe of the liver with potentially one or two lesions just crossing over into the right lobe. For these reasons, the patient would be a good candidate for Yttrium-90 radioembolization. Disease in the left lobe of the liver would be targeted. There are several publications showing survival benefit of Y-90 radioembolization in treatment-refractory hepatic metastatic disease from breast carcinoma.  Details of radioembolization were discussed with the patient including technical details and risks. The patient was also given additional literature to read. After discussion, the patient would like to proceed with radioembolization if it is approved. We will submit for preauthorization from her insurance company.

## 2012-02-15 ENCOUNTER — Other Ambulatory Visit: Payer: Self-pay | Admitting: Emergency Medicine

## 2012-02-15 DIAGNOSIS — C787 Secondary malignant neoplasm of liver and intrahepatic bile duct: Secondary | ICD-10-CM

## 2012-02-15 DIAGNOSIS — C50919 Malignant neoplasm of unspecified site of unspecified female breast: Secondary | ICD-10-CM

## 2012-02-16 ENCOUNTER — Ambulatory Visit (HOSPITAL_BASED_OUTPATIENT_CLINIC_OR_DEPARTMENT_OTHER): Payer: BC Managed Care – PPO

## 2012-02-16 ENCOUNTER — Other Ambulatory Visit (HOSPITAL_BASED_OUTPATIENT_CLINIC_OR_DEPARTMENT_OTHER): Payer: BC Managed Care – PPO | Admitting: Lab

## 2012-02-16 VITALS — BP 102/55 | HR 77 | Temp 98.2°F | Resp 20

## 2012-02-16 DIAGNOSIS — C7952 Secondary malignant neoplasm of bone marrow: Secondary | ICD-10-CM

## 2012-02-16 DIAGNOSIS — C787 Secondary malignant neoplasm of liver and intrahepatic bile duct: Secondary | ICD-10-CM

## 2012-02-16 DIAGNOSIS — C801 Malignant (primary) neoplasm, unspecified: Secondary | ICD-10-CM

## 2012-02-16 DIAGNOSIS — Z5112 Encounter for antineoplastic immunotherapy: Secondary | ICD-10-CM

## 2012-02-16 DIAGNOSIS — C50919 Malignant neoplasm of unspecified site of unspecified female breast: Secondary | ICD-10-CM

## 2012-02-16 DIAGNOSIS — C7951 Secondary malignant neoplasm of bone: Secondary | ICD-10-CM

## 2012-02-16 LAB — CBC WITH DIFFERENTIAL/PLATELET
BASO%: 0.6 % (ref 0.0–2.0)
Basophils Absolute: 0 10*3/uL (ref 0.0–0.1)
Eosinophils Absolute: 0.1 10*3/uL (ref 0.0–0.5)
HCT: 34 % — ABNORMAL LOW (ref 34.8–46.6)
HGB: 11.8 g/dL (ref 11.6–15.9)
LYMPH%: 14.7 % (ref 14.0–49.7)
MCHC: 34.6 g/dL (ref 31.5–36.0)
MONO#: 0.8 10*3/uL (ref 0.1–0.9)
NEUT%: 70.9 % (ref 38.4–76.8)
Platelets: 184 10*3/uL (ref 145–400)
WBC: 6.6 10*3/uL (ref 3.9–10.3)
lymph#: 1 10*3/uL (ref 0.9–3.3)

## 2012-02-16 LAB — COMPREHENSIVE METABOLIC PANEL (CC13)
AST: 73 U/L — ABNORMAL HIGH (ref 5–34)
Albumin: 3.7 g/dL (ref 3.5–5.0)
BUN: 21 mg/dL (ref 7.0–26.0)
CO2: 24 mEq/L (ref 22–29)
Calcium: 10 mg/dL (ref 8.4–10.4)
Chloride: 107 mEq/L (ref 98–107)
Creatinine: 1.4 mg/dL — ABNORMAL HIGH (ref 0.6–1.1)
Glucose: 98 mg/dl (ref 70–99)
Potassium: 4.4 mEq/L (ref 3.5–5.1)

## 2012-02-16 MED ORDER — SODIUM CHLORIDE 0.9 % IJ SOLN
10.0000 mL | INTRAMUSCULAR | Status: DC | PRN
  Administered 2012-02-16: 10 mL
  Filled 2012-02-16: qty 10

## 2012-02-16 MED ORDER — TRASTUZUMAB CHEMO INJECTION 440 MG
2.0000 mg/kg | Freq: Once | INTRAVENOUS | Status: AC
  Administered 2012-02-16: 126 mg via INTRAVENOUS
  Filled 2012-02-16: qty 6

## 2012-02-16 MED ORDER — HEPARIN SOD (PORK) LOCK FLUSH 100 UNIT/ML IV SOLN
500.0000 [IU] | Freq: Once | INTRAVENOUS | Status: AC | PRN
  Administered 2012-02-16: 500 [IU]
  Filled 2012-02-16: qty 5

## 2012-02-16 MED ORDER — ACETAMINOPHEN 325 MG PO TABS
650.0000 mg | ORAL_TABLET | Freq: Once | ORAL | Status: AC
  Administered 2012-02-16: 650 mg via ORAL

## 2012-02-16 MED ORDER — SODIUM CHLORIDE 0.9 % IV SOLN
Freq: Once | INTRAVENOUS | Status: AC
  Administered 2012-02-16: 09:00:00 via INTRAVENOUS

## 2012-02-16 NOTE — Patient Instructions (Signed)
[facility] Discharge Instructions for Patients Receiving Chemotherapy  Today you received the following chemotherapy agents Herceptin  To help prevent nausea and vomiting after your treatment, we encourage you to take your nausea medication as prescribed.   If you develop nausea and vomiting that is not controlled by your nausea medication, call the clinic. If it is after clinic hours your family physician or the after hours number for the clinic or go to the Emergency Department.   BELOW ARE SYMPTOMS THAT SHOULD BE REPORTED IMMEDIATELY:  *FEVER GREATER THAN 100.5 F  *CHILLS WITH OR WITHOUT FEVER  NAUSEA AND VOMITING THAT IS NOT CONTROLLED WITH YOUR NAUSEA MEDICATION  *UNUSUAL SHORTNESS OF BREATH  *UNUSUAL BRUISING OR BLEEDING  TENDERNESS IN MOUTH AND THROAT WITH OR WITHOUT PRESENCE OF ULCERS  *URINARY PROBLEMS  *BOWEL PROBLEMS  UNUSUAL RASH Items with * indicate a potential emergency and should be followed up as soon as possible.  One of the nurses will contact you 24 hours after your treatment. Please let the nurse know about any problems that you may have experienced. Feel free to call the clinic you have any questions or concerns. The clinic phone number is [phone].     

## 2012-02-20 ENCOUNTER — Other Ambulatory Visit: Payer: Self-pay | Admitting: *Deleted

## 2012-02-20 DIAGNOSIS — C50919 Malignant neoplasm of unspecified site of unspecified female breast: Secondary | ICD-10-CM

## 2012-02-21 ENCOUNTER — Other Ambulatory Visit (HOSPITAL_BASED_OUTPATIENT_CLINIC_OR_DEPARTMENT_OTHER): Payer: BC Managed Care – PPO | Admitting: Lab

## 2012-02-21 ENCOUNTER — Ambulatory Visit (HOSPITAL_COMMUNITY)
Admission: RE | Admit: 2012-02-21 | Discharge: 2012-02-21 | Disposition: A | Discharge status: Home / Self Care | Payer: BC Managed Care – PPO | Source: Ambulatory Visit | Attending: Interventional Radiology | Admitting: Interventional Radiology

## 2012-02-21 DIAGNOSIS — C787 Secondary malignant neoplasm of liver and intrahepatic bile duct: Secondary | ICD-10-CM

## 2012-02-21 DIAGNOSIS — C50919 Malignant neoplasm of unspecified site of unspecified female breast: Secondary | ICD-10-CM | POA: Insufficient documentation

## 2012-02-21 LAB — BASIC METABOLIC PANEL (CC13)
Chloride: 107 mEq/L (ref 98–107)
Glucose: 86 mg/dl (ref 70–99)
Potassium: 4.6 mEq/L (ref 3.5–5.1)
Sodium: 141 mEq/L (ref 136–145)

## 2012-02-21 MED ORDER — IOHEXOL 350 MG/ML SOLN
150.0000 mL | Freq: Once | INTRAVENOUS | Status: AC | PRN
  Administered 2012-02-21: 150 mL via INTRAVENOUS

## 2012-02-23 ENCOUNTER — Ambulatory Visit (HOSPITAL_BASED_OUTPATIENT_CLINIC_OR_DEPARTMENT_OTHER): Payer: BC Managed Care – PPO

## 2012-02-23 ENCOUNTER — Other Ambulatory Visit: Payer: BC Managed Care – PPO | Admitting: Lab

## 2012-02-23 ENCOUNTER — Other Ambulatory Visit: Payer: Self-pay | Admitting: *Deleted

## 2012-02-23 DIAGNOSIS — C50919 Malignant neoplasm of unspecified site of unspecified female breast: Secondary | ICD-10-CM

## 2012-02-23 DIAGNOSIS — C7951 Secondary malignant neoplasm of bone: Secondary | ICD-10-CM

## 2012-02-23 DIAGNOSIS — C787 Secondary malignant neoplasm of liver and intrahepatic bile duct: Secondary | ICD-10-CM

## 2012-02-23 DIAGNOSIS — Z5111 Encounter for antineoplastic chemotherapy: Secondary | ICD-10-CM

## 2012-02-23 DIAGNOSIS — C801 Malignant (primary) neoplasm, unspecified: Secondary | ICD-10-CM

## 2012-02-23 LAB — COMPREHENSIVE METABOLIC PANEL (CC13)
AST: 77 U/L — ABNORMAL HIGH (ref 5–34)
BUN: 15 mg/dL (ref 7.0–26.0)
Calcium: 9.7 mg/dL (ref 8.4–10.4)
Chloride: 106 mEq/L (ref 98–107)
Creatinine: 1 mg/dL (ref 0.6–1.1)
Glucose: 78 mg/dl (ref 70–99)

## 2012-02-23 LAB — CBC WITH DIFFERENTIAL/PLATELET
Basophils Absolute: 0 10*3/uL (ref 0.0–0.1)
Eosinophils Absolute: 0.3 10*3/uL (ref 0.0–0.5)
HGB: 11.2 g/dL — ABNORMAL LOW (ref 11.6–15.9)
NEUT#: 3.3 10*3/uL (ref 1.5–6.5)
RDW: 14 % (ref 11.2–14.5)
WBC: 5.4 10*3/uL (ref 3.9–10.3)
lymph#: 0.9 10*3/uL (ref 0.9–3.3)

## 2012-02-23 LAB — LACTATE DEHYDROGENASE (CC13): LDH: 1468 U/L — ABNORMAL HIGH (ref 125–245)

## 2012-02-23 MED ORDER — ACETAMINOPHEN 325 MG PO TABS
650.0000 mg | ORAL_TABLET | Freq: Once | ORAL | Status: AC
  Administered 2012-02-23: 650 mg via ORAL

## 2012-02-23 MED ORDER — GOSERELIN ACETATE 3.6 MG ~~LOC~~ IMPL
3.6000 mg | DRUG_IMPLANT | Freq: Once | SUBCUTANEOUS | Status: AC
  Administered 2012-02-23: 3.6 mg via SUBCUTANEOUS
  Filled 2012-02-23: qty 3.6

## 2012-02-23 MED ORDER — DENOSUMAB 120 MG/1.7ML ~~LOC~~ SOLN
120.0000 mg | Freq: Once | SUBCUTANEOUS | Status: AC
  Administered 2012-02-23: 120 mg via SUBCUTANEOUS
  Filled 2012-02-23: qty 1.7

## 2012-02-23 MED ORDER — DIPHENHYDRAMINE HCL 25 MG PO CAPS: 50.0000 mg | ORAL_CAPSULE | Freq: Once | ORAL | Status: DC

## 2012-02-23 MED ORDER — PACLITAXEL PROTEIN-BOUND CHEMO INJECTION 100 MG
100.0000 mg/m2 | Freq: Once | INTRAVENOUS | Status: AC
  Administered 2012-02-23: 175 mg via INTRAVENOUS
  Filled 2012-02-23: qty 35

## 2012-02-23 MED ORDER — TRASTUZUMAB CHEMO INJECTION 440 MG
2.0000 mg/kg | Freq: Once | INTRAVENOUS | Status: AC
  Administered 2012-02-23: 126 mg via INTRAVENOUS
  Filled 2012-02-23: qty 6

## 2012-02-23 MED ORDER — DEXAMETHASONE SODIUM PHOSPHATE 10 MG/ML IJ SOLN
10.0000 mg | Freq: Once | INTRAMUSCULAR | Status: AC
  Administered 2012-02-23: 4 mg via INTRAVENOUS

## 2012-02-23 MED ORDER — ONDANSETRON 8 MG/50ML IVPB (CHCC)
8.0000 mg | Freq: Once | INTRAVENOUS | Status: AC
  Administered 2012-02-23: 8 mg via INTRAVENOUS

## 2012-02-23 NOTE — Patient Instructions (Addendum)
Kathy Jimenez [birth year] [id number]  [facility] Discharge Instructions for Patients Receiving Chemotherapy  Today you received the following chemotherapy agents: Zoladex, Abraxane, Herceptin, Xgeva   To help prevent nausea and vomiting after your treatment, we encourage you to take your nausea medication as prescribed; if you develop nausea and vomiting that is not controlled by your nausea medication, call the clinic.   BELOW ARE SYMPTOMS THAT SHOULD BE REPORTED IMMEDIATELY:  *FEVER GREATER THAN 100.5 F *CHILLS WITH OR WITHOUT FEVER *UNUSUAL SHORTNESS OF BREATH *UNUSUAL BRUISING OR BLEEDING *URINARY PROBLEMS *BOWEL PROBLEMS  I have been informed and understand all the instructions given to me.   Please call the [facility] at [phone] during business hours should you have any further questions or need assistance in obtaining follow-up care. If you have a medical emergency, please dial 911.

## 2012-02-27 ENCOUNTER — Other Ambulatory Visit: Payer: Self-pay | Admitting: Certified Registered Nurse Anesthetist

## 2012-02-27 ENCOUNTER — Other Ambulatory Visit (HOSPITAL_BASED_OUTPATIENT_CLINIC_OR_DEPARTMENT_OTHER): Payer: BC Managed Care – PPO | Admitting: Lab

## 2012-02-27 ENCOUNTER — Telehealth: Payer: Self-pay | Admitting: *Deleted

## 2012-02-27 ENCOUNTER — Other Ambulatory Visit: Payer: Self-pay | Admitting: *Deleted

## 2012-02-27 ENCOUNTER — Encounter: Payer: Self-pay | Admitting: *Deleted

## 2012-02-27 ENCOUNTER — Other Ambulatory Visit: Payer: Self-pay | Admitting: Oncology

## 2012-02-27 DIAGNOSIS — C50919 Malignant neoplasm of unspecified site of unspecified female breast: Secondary | ICD-10-CM

## 2012-02-27 DIAGNOSIS — C787 Secondary malignant neoplasm of liver and intrahepatic bile duct: Secondary | ICD-10-CM

## 2012-02-27 LAB — CBC WITH DIFFERENTIAL/PLATELET
Basophils Absolute: 0 10*3/uL (ref 0.0–0.1)
Eosinophils Absolute: 0.2 10*3/uL (ref 0.0–0.5)
HGB: 11.4 g/dL — ABNORMAL LOW (ref 11.6–15.9)
MCV: 89.6 fL (ref 79.5–101.0)
MONO#: 0.1 10*3/uL (ref 0.1–0.9)
NEUT#: 3.4 10*3/uL (ref 1.5–6.5)
RDW: 13.9 % (ref 11.2–14.5)
WBC: 4.3 10*3/uL (ref 3.9–10.3)
lymph#: 0.6 10*3/uL — ABNORMAL LOW (ref 0.9–3.3)

## 2012-02-27 LAB — COMPREHENSIVE METABOLIC PANEL (CC13)
Albumin: 3.5 g/dL (ref 3.5–5.0)
BUN: 19 mg/dL (ref 7.0–26.0)
CO2: 24 mEq/L (ref 22–29)
Calcium: 9.7 mg/dL (ref 8.4–10.4)
Chloride: 105 mEq/L (ref 98–107)
Glucose: 91 mg/dl (ref 70–99)
Potassium: 4.5 mEq/L (ref 3.5–5.1)
Sodium: 139 mEq/L (ref 136–145)
Total Protein: 7.4 g/dL (ref 6.4–8.3)

## 2012-02-27 MED ORDER — PROMETHAZINE HCL 25 MG PO TABS: 25.0000 mg | ORAL_TABLET | Freq: Four times a day (QID) | ORAL | Status: DC | PRN

## 2012-02-27 MED ORDER — TRAMADOL HCL 50 MG PO TABS: 50.0000 mg | ORAL_TABLET | Freq: Four times a day (QID) | ORAL | Status: DC | PRN

## 2012-02-27 NOTE — Telephone Encounter (Signed)
Per staff message and POF I have scheduled appts.  JMW  

## 2012-02-27 NOTE — Telephone Encounter (Signed)
Patient placed on lab only schedule for 12:00pm

## 2012-02-28 ENCOUNTER — Other Ambulatory Visit (HOSPITAL_COMMUNITY): Payer: Self-pay | Admitting: Interventional Radiology

## 2012-02-28 DIAGNOSIS — C50919 Malignant neoplasm of unspecified site of unspecified female breast: Secondary | ICD-10-CM

## 2012-02-28 DIAGNOSIS — C787 Secondary malignant neoplasm of liver and intrahepatic bile duct: Secondary | ICD-10-CM

## 2012-02-29 ENCOUNTER — Telehealth: Payer: Self-pay | Admitting: Emergency Medicine

## 2012-02-29 NOTE — Telephone Encounter (Signed)
LM FOR PT TO CALL BACK RE: INS. AND Y-90   2-:43pm- lmovm on cell # to make pt. Aware that her Y-90 has been approved and to contact Clinton at [facility] to get dates and instructions.

## 2012-03-01 ENCOUNTER — Ambulatory Visit (HOSPITAL_BASED_OUTPATIENT_CLINIC_OR_DEPARTMENT_OTHER): Payer: BC Managed Care – PPO

## 2012-03-01 ENCOUNTER — Other Ambulatory Visit: Payer: Self-pay | Admitting: Oncology

## 2012-03-01 ENCOUNTER — Ambulatory Visit (HOSPITAL_BASED_OUTPATIENT_CLINIC_OR_DEPARTMENT_OTHER): Payer: BC Managed Care – PPO | Admitting: Lab

## 2012-03-01 VITALS — BP 112/81 | HR 93 | Temp 98.2°F

## 2012-03-01 DIAGNOSIS — Z5112 Encounter for antineoplastic immunotherapy: Secondary | ICD-10-CM

## 2012-03-01 DIAGNOSIS — C801 Malignant (primary) neoplasm, unspecified: Secondary | ICD-10-CM

## 2012-03-01 DIAGNOSIS — C50919 Malignant neoplasm of unspecified site of unspecified female breast: Secondary | ICD-10-CM

## 2012-03-01 DIAGNOSIS — C787 Secondary malignant neoplasm of liver and intrahepatic bile duct: Secondary | ICD-10-CM

## 2012-03-01 DIAGNOSIS — Z5111 Encounter for antineoplastic chemotherapy: Secondary | ICD-10-CM

## 2012-03-01 DIAGNOSIS — C7952 Secondary malignant neoplasm of bone marrow: Secondary | ICD-10-CM

## 2012-03-01 LAB — CBC WITH DIFFERENTIAL/PLATELET
Basophils Absolute: 0 10*3/uL (ref 0.0–0.1)
EOS%: 6.4 % (ref 0.0–7.0)
HCT: 31.2 % — ABNORMAL LOW (ref 34.8–46.6)
HGB: 10.9 g/dL — ABNORMAL LOW (ref 11.6–15.9)
MCH: 31.2 pg (ref 25.1–34.0)
NEUT#: 1.9 10*3/uL (ref 1.5–6.5)
NEUT%: 63.4 % (ref 38.4–76.8)
lymph#: 0.7 10*3/uL — ABNORMAL LOW (ref 0.9–3.3)

## 2012-03-01 LAB — COMPREHENSIVE METABOLIC PANEL (CC13)
AST: 54 U/L — ABNORMAL HIGH (ref 5–34)
BUN: 19 mg/dL (ref 7.0–26.0)
CO2: 25 mEq/L (ref 22–29)
Calcium: 9.9 mg/dL (ref 8.4–10.4)
Chloride: 106 mEq/L (ref 98–107)
Creatinine: 0.9 mg/dL (ref 0.6–1.1)

## 2012-03-01 LAB — LACTATE DEHYDROGENASE (CC13): LDH: 1294 U/L — ABNORMAL HIGH (ref 125–245)

## 2012-03-01 MED ORDER — ONDANSETRON 8 MG/50ML IVPB (CHCC)
8.0000 mg | Freq: Once | INTRAVENOUS | Status: AC
  Administered 2012-03-01: 8 mg via INTRAVENOUS

## 2012-03-01 MED ORDER — HEPARIN SOD (PORK) LOCK FLUSH 100 UNIT/ML IV SOLN
500.0000 [IU] | Freq: Once | INTRAVENOUS | Status: AC | PRN
  Administered 2012-03-01: 500 [IU]
  Filled 2012-03-01: qty 5

## 2012-03-01 MED ORDER — TRASTUZUMAB CHEMO INJECTION 440 MG
2.0000 mg/kg | Freq: Once | INTRAVENOUS | Status: AC
  Administered 2012-03-01: 126 mg via INTRAVENOUS
  Filled 2012-03-01: qty 6

## 2012-03-01 MED ORDER — ACETAMINOPHEN 325 MG PO TABS
650.0000 mg | ORAL_TABLET | Freq: Once | ORAL | Status: AC
  Administered 2012-03-01: 650 mg via ORAL

## 2012-03-01 MED ORDER — SODIUM CHLORIDE 0.9 % IJ SOLN
10.0000 mL | INTRAMUSCULAR | Status: DC | PRN
  Administered 2012-03-01: 10 mL
  Filled 2012-03-01: qty 10

## 2012-03-01 MED ORDER — SODIUM CHLORIDE 0.9 % IV SOLN
Freq: Once | INTRAVENOUS | Status: AC
  Administered 2012-03-01: 09:00:00 via INTRAVENOUS

## 2012-03-01 MED ORDER — PACLITAXEL PROTEIN-BOUND CHEMO INJECTION 100 MG
100.0000 mg/m2 | Freq: Once | INTRAVENOUS | Status: AC
  Administered 2012-03-01: 175 mg via INTRAVENOUS
  Filled 2012-03-01: qty 35

## 2012-03-01 NOTE — Patient Instructions (Addendum)
[facility] Discharge Instructions for Patients Receiving Chemotherapy  Today you received the following chemotherapy agents Abraxane and Herceptin  To help prevent nausea and vomiting after your treatment, we encourage you to take your nausea medication as prescribed.   If you develop nausea and vomiting that is not controlled by your nausea medication, call the clinic. If it is after clinic hours your family physician or the after hours number for the clinic or go to the Emergency Department.   BELOW ARE SYMPTOMS THAT SHOULD BE REPORTED IMMEDIATELY:  *FEVER GREATER THAN 100.5 F  *CHILLS WITH OR WITHOUT FEVER  NAUSEA AND VOMITING THAT IS NOT CONTROLLED WITH YOUR NAUSEA MEDICATION  *UNUSUAL SHORTNESS OF BREATH  *UNUSUAL BRUISING OR BLEEDING  TENDERNESS IN MOUTH AND THROAT WITH OR WITHOUT PRESENCE OF ULCERS  *URINARY PROBLEMS  *BOWEL PROBLEMS  UNUSUAL RASH Items with * indicate a potential emergency and should be followed up as soon as possible.  One of the nurses will contact you 24 hours after your treatment. Please let the nurse know about any problems that you may have experienced. Feel free to call the clinic you have any questions or concerns. The clinic phone number is [phone].   I have been informed and understand all the instructions given to me. I know to contact the clinic, my physician, or go to the Emergency Department if any problems should occur. I do not have any questions at this time, but understand that I may call the clinic during office hours   should I have any questions or need assistance in obtaining follow up care.    __________________________________________  _____________  __________ Signature of Patient or Authorized Representative            Date                   Time    __________________________________________ Nurse's Signature    

## 2012-03-04 ENCOUNTER — Encounter (HOSPITAL_COMMUNITY): Payer: Self-pay | Admitting: Pharmacist

## 2012-03-05 ENCOUNTER — Telehealth: Payer: Self-pay | Admitting: *Deleted

## 2012-03-05 ENCOUNTER — Other Ambulatory Visit: Payer: Self-pay | Admitting: *Deleted

## 2012-03-05 DIAGNOSIS — C50919 Malignant neoplasm of unspecified site of unspecified female breast: Secondary | ICD-10-CM

## 2012-03-05 NOTE — Telephone Encounter (Signed)
Added on lab for 03-08-2012 starting at 1:30pm before treatment

## 2012-03-08 ENCOUNTER — Other Ambulatory Visit: Payer: Self-pay | Admitting: Radiology

## 2012-03-08 ENCOUNTER — Other Ambulatory Visit: Payer: Self-pay | Admitting: Lab

## 2012-03-08 ENCOUNTER — Ambulatory Visit: Payer: Self-pay

## 2012-03-11 ENCOUNTER — Ambulatory Visit (HOSPITAL_COMMUNITY): Admission: RE | Admit: 2012-03-11 | Payer: BC Managed Care – PPO | Source: Ambulatory Visit

## 2012-03-11 ENCOUNTER — Telehealth: Payer: Self-pay | Admitting: *Deleted

## 2012-03-11 ENCOUNTER — Encounter (HOSPITAL_COMMUNITY): Payer: BC Managed Care – PPO

## 2012-03-11 ENCOUNTER — Ambulatory Visit (HOSPITAL_COMMUNITY): Payer: BC Managed Care – PPO

## 2012-03-11 NOTE — Telephone Encounter (Addendum)
LEFT DR.RUBIN'S POD FAX NUMBER, [phone], ON PT.'S VOICE MAIL. THIS NOTE TO DR.RUBIN'S NURSE'S DESK.

## 2012-03-14 ENCOUNTER — Telehealth: Payer: Self-pay | Admitting: *Deleted

## 2012-03-14 ENCOUNTER — Ambulatory Visit (HOSPITAL_COMMUNITY)
Admission: RE | Admit: 2012-03-14 | Discharge: 2012-03-14 | Disposition: A | Discharge status: Home / Self Care | Payer: BC Managed Care – PPO | Source: Ambulatory Visit | Attending: Internal Medicine | Admitting: Internal Medicine

## 2012-03-14 ENCOUNTER — Encounter (HOSPITAL_COMMUNITY): Payer: Self-pay

## 2012-03-14 ENCOUNTER — Ambulatory Visit (HOSPITAL_BASED_OUTPATIENT_CLINIC_OR_DEPARTMENT_OTHER)
Admission: RE | Admit: 2012-03-14 | Discharge: 2012-03-14 | Disposition: A | Discharge status: Home / Self Care | Payer: BC Managed Care – PPO | Source: Ambulatory Visit | Attending: Internal Medicine | Admitting: Internal Medicine

## 2012-03-14 VITALS — BP 126/82 | HR 82 | Wt 144.8 lb

## 2012-03-14 DIAGNOSIS — C50919 Malignant neoplasm of unspecified site of unspecified female breast: Secondary | ICD-10-CM

## 2012-03-14 DIAGNOSIS — I5022 Chronic systolic (congestive) heart failure: Secondary | ICD-10-CM

## 2012-03-14 DIAGNOSIS — I079 Rheumatic tricuspid valve disease, unspecified: Secondary | ICD-10-CM | POA: Insufficient documentation

## 2012-03-14 DIAGNOSIS — Z09 Encounter for follow-up examination after completed treatment for conditions other than malignant neoplasm: Secondary | ICD-10-CM

## 2012-03-14 DIAGNOSIS — C787 Secondary malignant neoplasm of liver and intrahepatic bile duct: Secondary | ICD-10-CM

## 2012-03-14 MED ORDER — FUROSEMIDE 40 MG PO TABS: 40.0000 mg | ORAL_TABLET | Freq: Every day | ORAL | Status: DC

## 2012-03-14 NOTE — Progress Notes (Signed)
Patient ID: Kathy Jimenez, female   DOB: [birth year], 41 y.o.   MRN: [id number] HPI: Kathy Jimenez is a 41 y.o. female with L breast CA in 2004 s/p lumpectomy and treatment with FEC (flourouracil, epirubicin, cyclophosphamide). Also treated with hormonal therapy. 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  Started on weekly herceptin. Recently also started on Perjeta and Tykerb. Dr. Donnie Coffin evaluated her for a cough and progressive DOE; CT which showed dilated heart with pleural effusion and echo with EF 25% with global HK. Mod MR/TR, lateral s' 6.1 sec/m. Admitted to the hospital for volume overload and tachycardia.  Diuresed 9 lbs, discharge weight 151 lbs.  With history of severe kidney stones, lasix 20 mg given prn.    ECHOS: 03/23/11: EF 25-30%, lateral s' 6.4 sec/m, LVIDd improved from 4.9 to 3.4.  05/24/11: EF 45-50%, diffuse hypokinesis 06/16/11: EF 60-65% 08/17/11: EF 50%  11/08/11: EF 55-60% (reviewed by Dr Gala Romney) 01/12/12 EF 50-55% (reviewed by Dr Gala Romney) 03/14/12: EF 55-60%, lat s' 9.5   01/10/12 liver biopsy   She returns for follow up today.  She feels good today.  She has started on abraxane/herceptin every week and tumor markers are falling.  Dr. Donnie Coffin called during visit and said liver biopsy is negative for Her2neu so no more herceptin.   Approved for radioactive seeds (Y90) but with tumor markers falling will hold off at this time.  Denies SOB/PND or orthopnea.  No edema.     ROS: All systems negative except as listed in HPI, PMH and Problem List.  Past Medical History  Diagnosis Date  . 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 Kidney stones     recurrent since 1989  . Endometrial polyp   . PONV (postoperative nausea and vomiting)   . CHF (congestive heart failure)     from chemo treatment    Current Outpatient Prescriptions  Medication Sig  Dispense Refill  . carvedilol (COREG) 6.25 MG tablet Take 6.25 mg by mouth 2 (two) times daily with a meal.      . lisinopril (PRINIVIL,ZESTRIL) 10 MG tablet Take 1 tablet (10 mg total) by mouth 2 (two) times daily.  60 tablet  6  . LORazepam (ATIVAN) 0.5 MG tablet Take 1 tablet (0.5 mg total) by mouth every 6 (six) hours as needed (Nausea or vomiting).  30 tablet  0  . Potassium Citrate-Citric Acid (CYTRA K CRYSTALS) 3300-1002 MG PACK Take 2 packets by mouth 2 (two) times daily with a meal. Mix in 6 ounces of water or juice.      . promethazine (PHENERGAN) 25 MG tablet Take 1 tablet (25 mg total) by mouth every 6 (six) hours as needed for nausea.  60 tablet  2  . spironolactone (ALDACTONE) 25 MG tablet Take 25 mg by mouth daily.      . traMADol (ULTRAM) 50 MG tablet Take 1 tablet (50 mg total) by mouth every 6 (six) hours as needed for pain.  60 tablet  2  . [DISCONTINUED] furosemide (LASIX) 20 MG tablet Take 1 tablet (20 mg total) by mouth daily as needed (If weight increases 3 pounds in 24 hours).  30 tablet  6     PHYSICAL EXAM: Filed Vitals:   03/14/12 0856  BP: 126/82  Pulse: 82  Weight: 144 lb 12.8 oz (65.681 kg)  SpO2: 100%  General:  Well appearing.  No resp difficulty HEENT: normal Neck: supple. JVP flat. Carotids 2+ bilaterally; no bruits. No lymphadenopathy or thryomegaly appreciated. Cor: PMI normal. Regular rate & rhythm.  Trivial TR murmur.   Lungs: clear Abdomen: soft. nontender, nondistended. No hepatosplenomegaly. No bruits or masses. Liver fullness RUQ pain Extremities: no cyanosis, clubbing, rash, edema Neuro: alert & orientedx3, cranial nerves grossly intact. Moves all 4 extremities w/o difficulty. Affect pleasant.    ASSESSMENT & PLAN:

## 2012-03-14 NOTE — Progress Notes (Signed)
  Echocardiogram 2D Echocardiogram has been performed.  Kathy Jimenez 03/14/2012, 9:04 AM

## 2012-03-15 ENCOUNTER — Ambulatory Visit (HOSPITAL_BASED_OUTPATIENT_CLINIC_OR_DEPARTMENT_OTHER): Payer: BC Managed Care – PPO

## 2012-03-15 ENCOUNTER — Other Ambulatory Visit: Payer: Self-pay | Admitting: *Deleted

## 2012-03-15 ENCOUNTER — Ambulatory Visit (HOSPITAL_BASED_OUTPATIENT_CLINIC_OR_DEPARTMENT_OTHER): Payer: BC Managed Care – PPO | Admitting: Lab

## 2012-03-15 ENCOUNTER — Other Ambulatory Visit: Payer: Self-pay | Admitting: Oncology

## 2012-03-15 ENCOUNTER — Other Ambulatory Visit (HOSPITAL_BASED_OUTPATIENT_CLINIC_OR_DEPARTMENT_OTHER): Payer: BC Managed Care – PPO | Admitting: *Deleted

## 2012-03-15 VITALS — BP 138/90 | HR 77 | Temp 97.9°F

## 2012-03-15 DIAGNOSIS — Z5111 Encounter for antineoplastic chemotherapy: Secondary | ICD-10-CM

## 2012-03-15 DIAGNOSIS — C7951 Secondary malignant neoplasm of bone: Secondary | ICD-10-CM

## 2012-03-15 DIAGNOSIS — C50919 Malignant neoplasm of unspecified site of unspecified female breast: Secondary | ICD-10-CM

## 2012-03-15 DIAGNOSIS — C787 Secondary malignant neoplasm of liver and intrahepatic bile duct: Secondary | ICD-10-CM

## 2012-03-15 LAB — CBC WITH DIFFERENTIAL/PLATELET
BASO%: 1 % (ref 0.0–2.0)
Basophils Absolute: 0 10*3/uL (ref 0.0–0.1)
EOS%: 1.2 % (ref 0.0–7.0)
HGB: 9.9 g/dL — ABNORMAL LOW (ref 11.6–15.9)
MCH: 31.8 pg (ref 25.1–34.0)
MCHC: 35 g/dL (ref 31.5–36.0)
MCV: 90.8 fL (ref 79.5–101.0)
MONO%: 18.8 % — ABNORMAL HIGH (ref 0.0–14.0)
RDW: 15.6 % — ABNORMAL HIGH (ref 11.2–14.5)
lymph#: 1.1 10*3/uL (ref 0.9–3.3)

## 2012-03-15 LAB — COMPREHENSIVE METABOLIC PANEL (CC13)
ALT: 22 U/L (ref 0–55)
BUN: 17 mg/dL (ref 7.0–26.0)
CO2: 26 mEq/L (ref 22–29)
Calcium: 9.6 mg/dL (ref 8.4–10.4)
Creatinine: 1.1 mg/dL (ref 0.6–1.1)
Glucose: 94 mg/dl (ref 70–99)
Total Bilirubin: 0.45 mg/dL (ref 0.20–1.20)

## 2012-03-15 MED ORDER — PROCHLORPERAZINE MALEATE 10 MG PO TABS: 10.0000 mg | ORAL_TABLET | Freq: Four times a day (QID) | ORAL | Status: DC | PRN

## 2012-03-15 MED ORDER — DEXAMETHASONE 4 MG PO TABS: 8.0000 mg | ORAL_TABLET | Freq: Two times a day (BID) | ORAL | Status: DC

## 2012-03-15 MED ORDER — SODIUM CHLORIDE 0.9 % IV SOLN
Freq: Once | INTRAVENOUS | Status: AC
  Administered 2012-03-15: 16:00:00 via INTRAVENOUS

## 2012-03-15 MED ORDER — ONDANSETRON HCL 8 MG PO TABS: 8.0000 mg | ORAL_TABLET | Freq: Two times a day (BID) | ORAL | Status: DC

## 2012-03-15 MED ORDER — LORAZEPAM 0.5 MG PO TABS: 0.5000 mg | ORAL_TABLET | Freq: Four times a day (QID) | ORAL | Status: DC | PRN

## 2012-03-15 MED ORDER — HEPARIN SOD (PORK) LOCK FLUSH 100 UNIT/ML IV SOLN
500.0000 [IU] | Freq: Once | INTRAVENOUS | Status: AC | PRN
  Administered 2012-03-15: 500 [IU]
  Filled 2012-03-15: qty 5

## 2012-03-15 MED ORDER — SODIUM CHLORIDE 0.9 % IJ SOLN
10.0000 mL | INTRAMUSCULAR | Status: DC | PRN
  Administered 2012-03-15: 10 mL
  Filled 2012-03-15: qty 10

## 2012-03-15 MED ORDER — DEXAMETHASONE SODIUM PHOSPHATE 10 MG/ML IJ SOLN: 10.0000 mg | Freq: Once | INTRAMUSCULAR | Status: DC

## 2012-03-15 MED ORDER — ONDANSETRON 8 MG/50ML IVPB (CHCC)
8.0000 mg | Freq: Once | INTRAVENOUS | Status: AC
  Administered 2012-03-15: 8 mg via INTRAVENOUS

## 2012-03-15 MED ORDER — LIDOCAINE-PRILOCAINE 2.5-2.5 % EX CREA: TOPICAL_CREAM | CUTANEOUS | Status: DC | PRN

## 2012-03-15 MED ORDER — PROCHLORPERAZINE 25 MG RE SUPP: 25.0000 mg | Freq: Two times a day (BID) | RECTAL | Status: DC | PRN

## 2012-03-15 MED ORDER — PACLITAXEL PROTEIN-BOUND CHEMO INJECTION 100 MG
100.0000 mg/m2 | Freq: Once | INTRAVENOUS | Status: AC
  Administered 2012-03-15: 175 mg via INTRAVENOUS
  Filled 2012-03-15: qty 35

## 2012-03-15 NOTE — Telephone Encounter (Signed)
Per staff message and POF I have scheduled appts. JW  

## 2012-03-15 NOTE — Progress Notes (Signed)
1605- Dr. Donnie Coffin at chairside to speak with patient-dhp, rn

## 2012-03-15 NOTE — Patient Instructions (Addendum)
[facility] Discharge Instructions for Patients Receiving Chemotherapy  Today you received the following chemotherapy agents Abraxane  To help prevent nausea and vomiting after your treatment, we encourage you to take your nausea medication as prescribed.   If you develop nausea and vomiting that is not controlled by your nausea medication, call the clinic. If it is after clinic hours your family physician or the after hours number for the clinic or go to the Emergency Department.   BELOW ARE SYMPTOMS THAT SHOULD BE REPORTED IMMEDIATELY:  *FEVER GREATER THAN 100.5 F  *CHILLS WITH OR WITHOUT FEVER  NAUSEA AND VOMITING THAT IS NOT CONTROLLED WITH YOUR NAUSEA MEDICATION  *UNUSUAL SHORTNESS OF BREATH  *UNUSUAL BRUISING OR BLEEDING  TENDERNESS IN MOUTH AND THROAT WITH OR WITHOUT PRESENCE OF ULCERS  *URINARY PROBLEMS  *BOWEL PROBLEMS  UNUSUAL RASH Items with * indicate a potential emergency and should be followed up as soon as possible.  Feel free to call the clinic you have any questions or concerns. The clinic phone number is [phone].   I have been informed and understand all the instructions given to me. I know to contact the clinic, my physician, or go to the Emergency Department if any problems should occur. I do not have any questions at this time, but understand that I may call the clinic during office hours   should I have any questions or need assistance in obtaining follow up care.   

## 2012-03-18 NOTE — Assessment & Plan Note (Signed)
Have reviewed today's echo with patient.  EF and lateral s' remain stable.  She will not receive further herceptin at this time with liver biopsy negative for Her-2/nue.  She will continue on abraxane.  Will continue to follow.

## 2012-03-18 NOTE — Assessment & Plan Note (Signed)
LVEF stable without dysfunction on echo.  No signs of heart failure.  Continue to monitor.  Follow up 1 month.

## 2012-03-19 ENCOUNTER — Ambulatory Visit (HOSPITAL_COMMUNITY): Payer: Self-pay

## 2012-03-19 ENCOUNTER — Other Ambulatory Visit (HOSPITAL_COMMUNITY): Payer: Self-pay

## 2012-03-19 ENCOUNTER — Inpatient Hospital Stay (HOSPITAL_COMMUNITY): Admission: RE | Admit: 2012-03-19 | Payer: Self-pay | Source: Ambulatory Visit

## 2012-03-22 ENCOUNTER — Other Ambulatory Visit (HOSPITAL_BASED_OUTPATIENT_CLINIC_OR_DEPARTMENT_OTHER): Payer: BC Managed Care – PPO | Admitting: Lab

## 2012-03-22 ENCOUNTER — Ambulatory Visit (HOSPITAL_BASED_OUTPATIENT_CLINIC_OR_DEPARTMENT_OTHER): Payer: BC Managed Care – PPO

## 2012-03-22 VITALS — BP 131/91 | HR 93 | Temp 98.2°F | Resp 18

## 2012-03-22 DIAGNOSIS — C50919 Malignant neoplasm of unspecified site of unspecified female breast: Secondary | ICD-10-CM

## 2012-03-22 DIAGNOSIS — Z5111 Encounter for antineoplastic chemotherapy: Secondary | ICD-10-CM

## 2012-03-22 DIAGNOSIS — C7951 Secondary malignant neoplasm of bone: Secondary | ICD-10-CM

## 2012-03-22 DIAGNOSIS — C787 Secondary malignant neoplasm of liver and intrahepatic bile duct: Secondary | ICD-10-CM

## 2012-03-22 LAB — CBC WITH DIFFERENTIAL/PLATELET
BASO%: 1.6 % (ref 0.0–2.0)
Eosinophils Absolute: 0.1 10*3/uL (ref 0.0–0.5)
HCT: 29.2 % — ABNORMAL LOW (ref 34.8–46.6)
HGB: 10.1 g/dL — ABNORMAL LOW (ref 11.6–15.9)
LYMPH%: 39.5 % (ref 14.0–49.7)
MCHC: 34.6 g/dL (ref 31.5–36.0)
MONO#: 0.2 10*3/uL (ref 0.1–0.9)
NEUT#: 1.7 10*3/uL (ref 1.5–6.5)
NEUT%: 49.9 % (ref 38.4–76.8)
Platelets: 192 10*3/uL (ref 145–400)
WBC: 3.4 10*3/uL — ABNORMAL LOW (ref 3.9–10.3)
lymph#: 1.3 10*3/uL (ref 0.9–3.3)

## 2012-03-22 LAB — COMPREHENSIVE METABOLIC PANEL (CC13)
ALT: 37 U/L (ref 0–55)
AST: 48 U/L — ABNORMAL HIGH (ref 5–34)
Albumin: 3.7 g/dL (ref 3.5–5.0)
BUN: 18 mg/dL (ref 7.0–26.0)
CO2: 26 mEq/L (ref 22–29)
Calcium: 9.7 mg/dL (ref 8.4–10.4)
Chloride: 107 mEq/L (ref 98–107)
Creatinine: 0.9 mg/dL (ref 0.6–1.1)
Potassium: 4.1 mEq/L (ref 3.5–5.1)

## 2012-03-22 MED ORDER — GOSERELIN ACETATE 3.6 MG ~~LOC~~ IMPL
3.6000 mg | DRUG_IMPLANT | Freq: Once | SUBCUTANEOUS | Status: AC
  Administered 2012-03-22: 3.6 mg via SUBCUTANEOUS
  Filled 2012-03-22: qty 3.6

## 2012-03-22 MED ORDER — HEPARIN SOD (PORK) LOCK FLUSH 100 UNIT/ML IV SOLN
500.0000 [IU] | Freq: Once | INTRAVENOUS | Status: AC | PRN
  Administered 2012-03-22: 500 [IU]
  Filled 2012-03-22: qty 5

## 2012-03-22 MED ORDER — DENOSUMAB 120 MG/1.7ML ~~LOC~~ SOLN
120.0000 mg | Freq: Once | SUBCUTANEOUS | Status: AC
  Administered 2012-03-22: 120 mg via SUBCUTANEOUS
  Filled 2012-03-22: qty 1.7

## 2012-03-22 MED ORDER — SODIUM CHLORIDE 0.9 % IJ SOLN
10.0000 mL | INTRAMUSCULAR | Status: DC | PRN
  Administered 2012-03-22: 10 mL
  Filled 2012-03-22: qty 10

## 2012-03-22 MED ORDER — DEXAMETHASONE SODIUM PHOSPHATE 10 MG/ML IJ SOLN: 10.0000 mg | Freq: Once | INTRAMUSCULAR | Status: DC

## 2012-03-22 MED ORDER — ONDANSETRON 8 MG/50ML IVPB (CHCC)
8.0000 mg | Freq: Once | INTRAVENOUS | Status: AC
  Administered 2012-03-22: 8 mg via INTRAVENOUS

## 2012-03-22 MED ORDER — SODIUM CHLORIDE 0.9 % IV SOLN
Freq: Once | INTRAVENOUS | Status: AC
  Administered 2012-03-22: 16:00:00 via INTRAVENOUS

## 2012-03-22 MED ORDER — PACLITAXEL PROTEIN-BOUND CHEMO INJECTION 100 MG
100.0000 mg/m2 | Freq: Once | INTRAVENOUS | Status: AC
  Administered 2012-03-22: 175 mg via INTRAVENOUS
  Filled 2012-03-22: qty 35

## 2012-03-22 NOTE — Patient Instructions (Signed)
[facility] Discharge Instructions for Patients Receiving Chemotherapy  Today you received the following chemotherapy agents Abraxane/xgeva/zoladex  To help prevent nausea and vomiting after your treatment, we encourage you to take your nausea medication as directed.   If you develop nausea and vomiting that is not controlled by your nausea medication, call the clinic. If it is after clinic hours your family physician or the after hours number for the clinic or go to the Emergency Department.   BELOW ARE SYMPTOMS THAT SHOULD BE REPORTED IMMEDIATELY:  *FEVER GREATER THAN 100.5 F  *CHILLS WITH OR WITHOUT FEVER  NAUSEA AND VOMITING THAT IS NOT CONTROLLED WITH YOUR NAUSEA MEDICATION  *UNUSUAL SHORTNESS OF BREATH  *UNUSUAL BRUISING OR BLEEDING  TENDERNESS IN MOUTH AND THROAT WITH OR WITHOUT PRESENCE OF ULCERS  *URINARY PROBLEMS  *BOWEL PROBLEMS  UNUSUAL RASH Items with * indicate a potential emergency and should be followed up as soon as possible.  One of the nurses will contact you 24 hours after your treatment. Please let the nurse know about any problems that you may have experienced. Feel free to call the clinic you have any questions or concerns. The clinic phone number is [phone].   I have been informed and understand all the instructions given to me. I know to contact the clinic, my physician, or go to the Emergency Department if any problems should occur. I do not have any questions at this time, but understand that I may call the clinic during office hours   should I have any questions or need assistance in obtaining follow up care.    __________________________________________  _____________  __________ Signature of Patient or Authorized Representative            Date                   Time    __________________________________________ Nurse's Signature

## 2012-03-29 ENCOUNTER — Other Ambulatory Visit: Payer: Self-pay | Admitting: *Deleted

## 2012-03-29 ENCOUNTER — Other Ambulatory Visit: Payer: Self-pay | Admitting: Oncology

## 2012-03-29 ENCOUNTER — Ambulatory Visit (HOSPITAL_BASED_OUTPATIENT_CLINIC_OR_DEPARTMENT_OTHER): Payer: BC Managed Care – PPO

## 2012-03-29 ENCOUNTER — Other Ambulatory Visit (HOSPITAL_BASED_OUTPATIENT_CLINIC_OR_DEPARTMENT_OTHER): Payer: BC Managed Care – PPO | Admitting: Lab

## 2012-03-29 VITALS — BP 109/70 | HR 85 | Temp 97.4°F | Resp 16

## 2012-03-29 DIAGNOSIS — C787 Secondary malignant neoplasm of liver and intrahepatic bile duct: Secondary | ICD-10-CM

## 2012-03-29 DIAGNOSIS — C50919 Malignant neoplasm of unspecified site of unspecified female breast: Secondary | ICD-10-CM

## 2012-03-29 DIAGNOSIS — C7952 Secondary malignant neoplasm of bone marrow: Secondary | ICD-10-CM

## 2012-03-29 DIAGNOSIS — C7951 Secondary malignant neoplasm of bone: Secondary | ICD-10-CM

## 2012-03-29 DIAGNOSIS — Z5111 Encounter for antineoplastic chemotherapy: Secondary | ICD-10-CM

## 2012-03-29 LAB — CBC WITH DIFFERENTIAL/PLATELET
Basophils Absolute: 0 10*3/uL (ref 0.0–0.1)
HCT: 27.7 % — ABNORMAL LOW (ref 34.8–46.6)
HGB: 9.9 g/dL — ABNORMAL LOW (ref 11.6–15.9)
MCH: 32 pg (ref 25.1–34.0)
MONO#: 0.2 10*3/uL (ref 0.1–0.9)
NEUT%: 25.5 % — ABNORMAL LOW (ref 38.4–76.8)
Platelets: 272 10*3/uL (ref 145–400)
WBC: 2.3 10*3/uL — ABNORMAL LOW (ref 3.9–10.3)
lymph#: 1.4 10*3/uL (ref 0.9–3.3)

## 2012-03-29 LAB — PROTIME-INR: Protime: 12 Seconds (ref 10.6–13.4)

## 2012-03-29 MED ORDER — DEXAMETHASONE SODIUM PHOSPHATE 10 MG/ML IJ SOLN
10.0000 mg | Freq: Once | INTRAMUSCULAR | Status: DC
  Administered 2012-03-29: 10 mg via INTRAVENOUS

## 2012-03-29 MED ORDER — SODIUM CHLORIDE 0.9 % IV SOLN
Freq: Once | INTRAVENOUS | Status: DC
  Administered 2012-03-29: 16:00:00 via INTRAVENOUS

## 2012-03-29 MED ORDER — PACLITAXEL PROTEIN-BOUND CHEMO INJECTION 100 MG: 100.0000 mg/m2 | Freq: Once | INTRAVENOUS | Status: DC

## 2012-03-29 MED ORDER — ONDANSETRON 8 MG/50ML IVPB (CHCC)
8.0000 mg | Freq: Once | INTRAVENOUS | Status: DC
  Administered 2012-03-29: 8 mg via INTRAVENOUS

## 2012-03-29 MED ORDER — HEPARIN SOD (PORK) LOCK FLUSH 100 UNIT/ML IV SOLN
500.0000 [IU] | Freq: Once | INTRAVENOUS | Status: AC | PRN
  Administered 2012-03-29: 500 [IU]
  Filled 2012-03-29: qty 5

## 2012-03-29 MED ORDER — SODIUM CHLORIDE 0.9 % IJ SOLN
10.0000 mL | INTRAMUSCULAR | Status: DC | PRN
  Administered 2012-03-29: 10 mL
  Filled 2012-03-29: qty 10

## 2012-03-29 NOTE — Patient Instructions (Signed)
[facility] Discharge Instructions for Patients Receiving Chemotherapy  Today you received the following chemotherapy agents Abraxane.  To help prevent nausea and vomiting after your treatment, we encourage you to take your nausea medicine.  ation {C If you develop nausea and vomiting that is not controlled by your nausea medication, call the clinic. If it is after clinic hours your family physician or the after hours number for the clinic or go to the Emergency Department.   BELOW ARE SYMPTOMS THAT SHOULD BE REPORTED IMMEDIATELY:  *FEVER GREATER THAN 100.5 F  *CHILLS WITH OR WITHOUT FEVER  NAUSEA AND VOMITING THAT IS NOT CONTROLLED WITH YOUR NAUSEA MEDICATION  *UNUSUAL SHORTNESS OF BREATH  *UNUSUAL BRUISING OR BLEEDING  TENDERNESS IN MOUTH AND THROAT WITH OR WITHOUT PRESENCE OF ULCERS  *URINARY PROBLEMS  *BOWEL PROBLEMS  UNUSUAL RASH Items with * indicate a potential emergency and should be followed up as soon as possible.  One of the nurses will contact you 24 hours after your treatment. Please let the nurse know about any problems that you may have experienced. Feel free to call the clinic you have any questions or concerns. The clinic phone number is [phone].   I have been informed and understand all the instructions given to me. I know to contact the clinic, my physician, or go to the Emergency Department if any problems should occur. I do not have any questions at this time, but understand that I may call the clinic during office hours   should I have any questions or need assistance in obtaining follow up care.    __________________________________________  _____________  __________ Signature of Patient or Authorized Representative            Date                   Time    __________________________________________ Nurse's Signature

## 2012-03-30 LAB — CANCER ANTIGEN 27.29: CA 27.29: 118 U/mL — ABNORMAL HIGH (ref 0–39)

## 2012-04-01 ENCOUNTER — Telehealth: Payer: Self-pay | Admitting: *Deleted

## 2012-04-01 ENCOUNTER — Other Ambulatory Visit: Payer: Self-pay | Admitting: *Deleted

## 2012-04-01 DIAGNOSIS — C50919 Malignant neoplasm of unspecified site of unspecified female breast: Secondary | ICD-10-CM

## 2012-04-01 LAB — COMPREHENSIVE METABOLIC PANEL
ALT: 29 U/L (ref 0–35)
Alkaline Phosphatase: 74 U/L (ref 39–117)
CO2: 24 mEq/L (ref 19–32)
Creatinine, Ser: 1.06 mg/dL (ref 0.50–1.10)
Total Bilirubin: 0.4 mg/dL (ref 0.3–1.2)

## 2012-04-01 LAB — LACTATE DEHYDROGENASE: LDH: 345 U/L — ABNORMAL HIGH (ref 94–250)

## 2012-04-01 NOTE — Telephone Encounter (Signed)
needs chemo 12/27 and 1/3 after lab appt.  Sent michelle email to set up patient treatment

## 2012-04-02 ENCOUNTER — Telehealth: Payer: Self-pay | Admitting: *Deleted

## 2012-04-02 NOTE — Telephone Encounter (Signed)
Per Misty Stanley from [facility] center I have moved appt from 1/3 to 1/2. Appt moved due to MD reassignment.    JMW

## 2012-04-02 NOTE — Telephone Encounter (Signed)
Left message for pt to return my call about her upcoming appt. 

## 2012-04-04 ENCOUNTER — Other Ambulatory Visit: Payer: Self-pay | Admitting: *Deleted

## 2012-04-04 DIAGNOSIS — C50919 Malignant neoplasm of unspecified site of unspecified female breast: Secondary | ICD-10-CM

## 2012-04-05 ENCOUNTER — Ambulatory Visit (HOSPITAL_BASED_OUTPATIENT_CLINIC_OR_DEPARTMENT_OTHER): Payer: BC Managed Care – PPO

## 2012-04-05 ENCOUNTER — Other Ambulatory Visit (HOSPITAL_BASED_OUTPATIENT_CLINIC_OR_DEPARTMENT_OTHER): Payer: BC Managed Care – PPO | Admitting: Lab

## 2012-04-05 VITALS — BP 111/81 | HR 95 | Temp 97.8°F | Resp 18

## 2012-04-05 DIAGNOSIS — Z5111 Encounter for antineoplastic chemotherapy: Secondary | ICD-10-CM

## 2012-04-05 DIAGNOSIS — C787 Secondary malignant neoplasm of liver and intrahepatic bile duct: Secondary | ICD-10-CM

## 2012-04-05 DIAGNOSIS — C7952 Secondary malignant neoplasm of bone marrow: Secondary | ICD-10-CM

## 2012-04-05 DIAGNOSIS — C50919 Malignant neoplasm of unspecified site of unspecified female breast: Secondary | ICD-10-CM

## 2012-04-05 DIAGNOSIS — C7951 Secondary malignant neoplasm of bone: Secondary | ICD-10-CM

## 2012-04-05 LAB — CBC WITH DIFFERENTIAL/PLATELET
Basophils Absolute: 0 10*3/uL (ref 0.0–0.1)
EOS%: 1.2 % (ref 0.0–7.0)
HCT: 32.9 % — ABNORMAL LOW (ref 34.8–46.6)
HGB: 11.5 g/dL — ABNORMAL LOW (ref 11.6–15.9)
MCH: 32.4 pg (ref 25.1–34.0)
MONO#: 0.7 10*3/uL (ref 0.1–0.9)
NEUT%: 50.9 % (ref 38.4–76.8)
Platelets: 207 10*3/uL (ref 145–400)
lymph#: 1.3 10*3/uL (ref 0.9–3.3)

## 2012-04-05 LAB — COMPREHENSIVE METABOLIC PANEL (CC13)
ALT: 21 U/L (ref 0–55)
AST: 34 U/L (ref 5–34)
Alkaline Phosphatase: 74 U/L (ref 40–150)
CO2: 25 mEq/L (ref 22–29)
Sodium: 143 mEq/L (ref 136–145)
Total Bilirubin: 0.4 mg/dL (ref 0.20–1.20)
Total Protein: 7.3 g/dL (ref 6.4–8.3)

## 2012-04-05 LAB — LACTATE DEHYDROGENASE (CC13): LDH: 325 U/L — ABNORMAL HIGH (ref 125–245)

## 2012-04-05 MED ORDER — ONDANSETRON 8 MG/50ML IVPB (CHCC)
8.0000 mg | Freq: Once | INTRAVENOUS | Status: AC
  Administered 2012-04-05: 8 mg via INTRAVENOUS

## 2012-04-05 MED ORDER — PACLITAXEL PROTEIN-BOUND CHEMO INJECTION 100 MG
100.0000 mg/m2 | Freq: Once | INTRAVENOUS | Status: AC
  Administered 2012-04-05: 175 mg via INTRAVENOUS
  Filled 2012-04-05: qty 35

## 2012-04-05 MED ORDER — SODIUM CHLORIDE 0.9 % IJ SOLN
10.0000 mL | INTRAMUSCULAR | Status: DC | PRN
  Administered 2012-04-05: 10 mL
  Filled 2012-04-05: qty 10

## 2012-04-05 MED ORDER — HEPARIN SOD (PORK) LOCK FLUSH 100 UNIT/ML IV SOLN
500.0000 [IU] | Freq: Once | INTRAVENOUS | Status: AC | PRN
  Administered 2012-04-05: 500 [IU]
  Filled 2012-04-05: qty 5

## 2012-04-05 MED ORDER — SODIUM CHLORIDE 0.9 % IV SOLN
Freq: Once | INTRAVENOUS | Status: AC
  Administered 2012-04-05: 16:00:00 via INTRAVENOUS

## 2012-04-05 MED ORDER — DEXAMETHASONE SODIUM PHOSPHATE 10 MG/ML IJ SOLN: 10.0000 mg | Freq: Once | INTRAMUSCULAR | Status: DC

## 2012-04-05 NOTE — Patient Instructions (Addendum)
[facility] Discharge Instructions for Patients Receiving Chemotherapy  Today you received the following chemotherapy agents Abraxane To help prevent nausea and vomiting after your treatment, we encourage you to take your nausea medication as prescribed. If you develop nausea and vomiting that is not controlled by your nausea medication, call the clinic. If it is after clinic hours your family physician or the after hours number for the clinic or go to the Emergency Department.   BELOW ARE SYMPTOMS THAT SHOULD BE REPORTED IMMEDIATELY:  *FEVER GREATER THAN 100.5 F  *CHILLS WITH OR WITHOUT FEVER  NAUSEA AND VOMITING THAT IS NOT CONTROLLED WITH YOUR NAUSEA MEDICATION  *UNUSUAL SHORTNESS OF BREATH  *UNUSUAL BRUISING OR BLEEDING  TENDERNESS IN MOUTH AND THROAT WITH OR WITHOUT PRESENCE OF ULCERS  *URINARY PROBLEMS  *BOWEL PROBLEMS  UNUSUAL RASH Items with * indicate a potential emergency and should be followed up as soon as possible.  One of the nurses will contact you 24 hours after your treatment. Please let the nurse know about any problems that you may have experienced. Feel free to call the clinic you have any questions or concerns. The clinic phone number is [phone].   I have been informed and understand all the instructions given to me. I know to contact the clinic, my physician, or go to the Emergency Department if any problems should occur. I do not have any questions at this time, but understand that I may call the clinic during office hours   should I have any questions or need assistance in obtaining follow up care.    __________________________________________  _____________  __________ Signature of Patient or Authorized Representative            Date                   Time    __________________________________________ Nurse's Signature    

## 2012-04-06 LAB — CANCER ANTIGEN 27.29: CA 27.29: 97 U/mL — ABNORMAL HIGH (ref 0–39)

## 2012-04-07 NOTE — Progress Notes (Signed)
Hematology and Oncology Follow Up Visit  Kathy Jimenez [id number] 01-May-1970 41 y.o. 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 This was her2 and ER+PR+. Per her request, she was initially treated with a non-chemotherapy approach using zoladex/faslodex and xgeva with herceptin  She unfortunately developed cardiomyopathy 11/12, and so herceptin was held. After normalization of EF in 3/13, she was started on kadcyla with improvement  of her liver disease and markers till 10/27/2011, when disease began to progress again. A repeat liver biopsy 01/10/12, confirmed a low degree of her2 expression by both hermark and CISH.   She has since received CMF with herceptin q 3 weeks from 11/10/11 through 01/19/12, with continued zoladex.and herceptin . Herceptin has been stopped after 03/01/12, when the hermark assay became final.   On 02/23/12 she was started on single agent abraxne which she is receiving on a day 1,8, 15 basis with 5th cycle given on 04/05/12.   The plan is to restage her with a PET scan on 04/15/12.Asuming a good response , she wishes to be go ahead with a Y90 infusion. This has already been arranged with Dr Fredia Sorrow and interventional radiology.    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 She really denies abdominal pain; though in the past she has had epigastric pain. Of note both her LDH and CA27.29 have fallen dramatically with the use of abraxane.she does note some numbness worse in her toes than in her fingers.  PMH Diagnosed with T1CNo, er/pr+ her2+ve breast cancer, for which she received FEC x6, followed by xrt anfd tamoxifen x 5 yrs.   Review of Systems: Constitutional:  no weight loss, fever, night sweats and feels well Eyes:  no complaints ENT: no complaints Cardiovascular: no chest pain or dyspnea on exertion Respiratory: no cough, shortness of breath, or wheezing Neurological: no TIA or stroke symptoms Dermatological: negative Gastrointestinal: no abdominal pain, change in bowel habits, or black or bloody stools Genito-Urinary: no dysuria, trouble voiding, or hematuria Hematological and Lymphatic: negative Breast: negative Musculoskeletal: negative Remaining ROS negative.   Medications:   I have reviewed the patient's current medications.  Current Outpatient Prescriptions  Medication Sig Dispense Refill  . carvedilol (COREG) 6.25 MG tablet Take 6.25 mg by mouth 2 (two) times daily with a meal.      . lidocaine-prilocaine (EMLA) cream Apply topically as needed.  30 g  1  . lisinopril (PRINIVIL,ZESTRIL) 10 MG tablet Take 1 tablet (10 mg total) by mouth 2 (two) times daily.  60 tablet  6  . LORazepam (ATIVAN) 0.5 MG tablet Take 1 tablet (0.5 mg total) by mouth every 6 (six) hours as needed (Nausea or vomiting).  30 tablet  0  . Potassium Citrate-Citric Acid (CYTRA K CRYSTALS) 3300-1002 MG PACK Take 2 packets by mouth 2 (two) times daily with a meal. Mix in 6 ounces of water or juice.      . prochlorperazine (COMPAZINE) 10 MG tablet Take 1 tablet (10 mg total) by mouth every 6 (six) hours as needed (Nausea or vomiting).  30 tablet  1  . traMADol (ULTRAM) 50 MG tablet Take 1 tablet (50 mg total) by mouth every 6 (six) hours as needed for pain.  60 tablet  2   No current  facility-administered medications for this visit.   Facility-Administered Medications Ordered in Other Visits  Medication Dose Route Frequency Provider Last Rate Last Dose  . dexamethasone (DECADRON) injection 10 mg  10 mg Intravenous Once Pierce Crane, MD      . sodium chloride 0.9 % injection 10 mL  10 mL Intracatheter PRN Pierce Crane, MD   10 mL at 03/22/12 1737    Allergies:  Allergies  Allergen Reactions  . Sulfa Antibiotics Hives     Physical Exam: There were no vitals filed for this visit.  There is no height or weight on file to calculate BMI. Weight: 141 lbs. HEENT:  Sclerae anicteric, conjunctivae pink.  Oropharynx clear.  No mucositis or candidiasis.   Nodes:  No cervical, supraclavicular, or axillary lymphadenopathy palpated.   Lungs:  Clear to auscultation bilaterally.  No crackles, rhonchi, or wheezes.   Heart:  Regular rate and rhythm.   Abdomen:  Abdomen is soft, nontender, without evidence of hepatomegaly, thogh liver edge is palpable. Normal bowel sounds are present. No evidence of distention. Musculoskeletal:  No focal spinal tenderness to palpation.  Extremities:  Benign.  No peripheral edema or cyanosis.   Skin:  Benign.   Neuro:  Nonfocal, alert and oriented x 3.   Lab Results: Lab Results  Component Value Date   WBC 4.4 04/05/2012   HGB 11.5* 04/05/2012   HCT 32.9* 04/05/2012   MCV 92.5 04/05/2012   PLT 207 04/05/2012   NEUTROABS 2.2 04/05/2012     Chemistry      Component Value Date/Time   NA 139 03/29/2012 1636   NA 141 03/22/2012 1544   NA 141 09/02/2010 1134   K 4.4 03/29/2012 1636   K 4.1 03/22/2012 1544   K 4.3 09/02/2010 1134   CL 103 03/29/2012 1636   CL 107 03/22/2012 1544   CL 103 09/02/2010 1134   CO2 24 03/29/2012 1636   CO2 26 03/22/2012 1544   CO2 25 09/02/2010 1134   BUN 21 03/29/2012 1636   BUN 18.0 03/22/2012 1544   BUN 11 09/02/2010 1134   CREATININE 1.06 03/29/2012 1636   CREATININE 0.9 03/22/2012 1544   CREATININE 0.7 09/02/2010 1134      Component Value Date/Time   CALCIUM 9.8 03/29/2012 1636   CALCIUM 9.7 03/22/2012 1544   CALCIUM 8.4 09/02/2010 1134   ALKPHOS 74 03/29/2012 1636   ALKPHOS 89 03/22/2012 1544   ALKPHOS 84 09/02/2010 1134   AST 38* 03/29/2012 1636   AST 48* 03/22/2012 1544   AST 27 09/02/2010 1134   ALT 29 03/29/2012 1636   ALT 37 03/22/2012 1544   BILITOT 0.4 03/29/2012 1636   BILITOT 0.61 03/22/2012 1544   BILITOT 0.60 09/02/2010 1134       Lab Results  Component Value Date   LABCA2 97* 04/05/2012     D.     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  She will be due to receive week 2, cycle 2  abraxane on 04/11/12.   Plan:  Patient is doing fairly well. Current plan is to continue the present regimen we will get an input from radiology regarding possible y90 infusion . She really wants keep and maintain her quality of life. She is less interested in pursuing aggressive chemotherapies which would make her sick.  Pierce Crane, MD

## 2012-04-08 ENCOUNTER — Other Ambulatory Visit (HOSPITAL_COMMUNITY): Payer: Self-pay | Admitting: Interventional Radiology

## 2012-04-08 ENCOUNTER — Other Ambulatory Visit: Payer: Self-pay | Admitting: *Deleted

## 2012-04-08 ENCOUNTER — Telehealth: Payer: Self-pay | Admitting: Oncology

## 2012-04-08 DIAGNOSIS — C50919 Malignant neoplasm of unspecified site of unspecified female breast: Secondary | ICD-10-CM

## 2012-04-08 DIAGNOSIS — C787 Secondary malignant neoplasm of liver and intrahepatic bile duct: Secondary | ICD-10-CM

## 2012-04-09 NOTE — Telephone Encounter (Signed)
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

## 2012-04-11 ENCOUNTER — Ambulatory Visit (HOSPITAL_BASED_OUTPATIENT_CLINIC_OR_DEPARTMENT_OTHER): Payer: BC Managed Care – PPO | Admitting: Oncology

## 2012-04-11 ENCOUNTER — Other Ambulatory Visit (HOSPITAL_BASED_OUTPATIENT_CLINIC_OR_DEPARTMENT_OTHER): Payer: BC Managed Care – PPO | Admitting: Lab

## 2012-04-11 ENCOUNTER — Telehealth: Payer: Self-pay | Admitting: *Deleted

## 2012-04-11 ENCOUNTER — Other Ambulatory Visit: Payer: Self-pay | Admitting: Radiology

## 2012-04-11 ENCOUNTER — Ambulatory Visit: Payer: Self-pay

## 2012-04-11 ENCOUNTER — Encounter: Payer: Self-pay | Admitting: Oncology

## 2012-04-11 VITALS — BP 109/80 | HR 91 | Temp 98.1°F | Resp 20 | Ht 64.0 in | Wt 142.2 lb

## 2012-04-11 DIAGNOSIS — Z170 Estrogen receptor positive status [ER+]: Secondary | ICD-10-CM

## 2012-04-11 DIAGNOSIS — C7951 Secondary malignant neoplasm of bone: Secondary | ICD-10-CM

## 2012-04-11 DIAGNOSIS — C50919 Malignant neoplasm of unspecified site of unspecified female breast: Secondary | ICD-10-CM

## 2012-04-11 DIAGNOSIS — C787 Secondary malignant neoplasm of liver and intrahepatic bile duct: Secondary | ICD-10-CM

## 2012-04-11 LAB — CBC WITH DIFFERENTIAL/PLATELET
Basophils Absolute: 0 10*3/uL (ref 0.0–0.1)
Eosinophils Absolute: 0 10*3/uL (ref 0.0–0.5)
HCT: 30.6 % — ABNORMAL LOW (ref 34.8–46.6)
LYMPH%: 42.5 % (ref 14.0–49.7)
MCV: 91.9 fL (ref 79.5–101.0)
MONO#: 0.1 10*3/uL (ref 0.1–0.9)
NEUT#: 1.1 10*3/uL — ABNORMAL LOW (ref 1.5–6.5)
NEUT%: 49.7 % (ref 38.4–76.8)
Platelets: 156 10*3/uL (ref 145–400)
WBC: 2.3 10*3/uL — ABNORMAL LOW (ref 3.9–10.3)

## 2012-04-11 LAB — COMPREHENSIVE METABOLIC PANEL (CC13)
Albumin: 3.8 g/dL (ref 3.5–5.0)
Alkaline Phosphatase: 74 U/L (ref 40–150)
BUN: 18 mg/dL (ref 7.0–26.0)
Calcium: 9.3 mg/dL (ref 8.4–10.4)
Chloride: 106 mEq/L (ref 98–107)
Creatinine: 0.8 mg/dL (ref 0.6–1.1)
Glucose: 98 mg/dl (ref 70–99)
Potassium: 4.7 mEq/L (ref 3.5–5.1)

## 2012-04-11 NOTE — Telephone Encounter (Signed)
see kk on 04/19/12

## 2012-04-11 NOTE — Patient Instructions (Addendum)
Hold chemotherapy today  You will proceed with Y90  I will see you see back on 04/19/12  Letrozole tablets What is this medicine? LETROZOLE (LET roe zole) blocks the production of estrogen. Certain types of breast cancer grow under the influence of estrogen. Letrozole helps block tumor growth. This medicine is used to treat advanced breast cancer in postmenopausal women. This medicine may be used for other purposes; ask your health care provider or pharmacist if you have questions. What should I tell my health care provider before I take this medicine? They need to know if you have any of these conditions: -liver disease -osteoporosis (weak bones) -an unusual or allergic reaction to letrozole, other medicines, foods, dyes, or preservatives -pregnant or trying to get pregnant -breast-feeding How should I use this medicine? Take this medicine by mouth with a glass of water. You may take it with or without food. Follow the directions on the prescription label. Take your medicine at regular intervals. Do not take your medicine more often than directed. Do not stop taking except on your doctor's advice. Talk to your pediatrician regarding the use of this medicine in children. Special care may be needed. Overdosage: If you think you have taken too much of this medicine contact a poison control center or emergency room at once. NOTE: This medicine is only for you. Do not share this medicine with others. What if I miss a dose? If you miss a dose, take it as soon as you can. If it is almost time for your next dose, take only that dose. Do not take double or extra doses. What may interact with this medicine? Do not take this medicine with any of the following medications: -estrogens, like hormone replacement therapy or birth control pills This medicine may also interact with the following medications: -dietary supplements such as androstenedione or DHEA -prasterone -tamoxifen This list may not  describe all possible interactions. Give your health care provider a list of all the medicines, herbs, non-prescription drugs, or dietary supplements you use. Also tell them if you smoke, drink alcohol, or use illegal drugs. Some items may interact with your medicine. What should I watch for while using this medicine? Visit your doctor or health care professional for regular check-ups to monitor your condition. Do not use this drug if you are pregnant. Serious side effects to an unborn child are possible. Talk to your doctor or pharmacist for more information. You may get drowsy or dizzy. Do not drive, use machinery, or do anything that needs mental alertness until you know how this medicine affects you. Do not stand or sit up quickly, especially if you are an older patient. This reduces the risk of dizzy or fainting spells. What side effects may I notice from receiving this medicine? Side effects that you should report to your doctor or health care professional as soon as possible: -allergic reactions like skin rash, itching, or hives -bone fracture -chest pain -difficulty breathing or shortness of breath -severe pain, swelling, warmth in the leg -unusually weak or tired -vaginal bleeding Side effects that usually do not require medical attention (report to your doctor or health care professional if they continue or are bothersome): -bone, back, joint, or muscle pain -dizziness -fatigue -fluid retention -headache -hot flashes, night sweats -nausea -weight gain This list may not describe all possible side effects. Call your doctor for medical advice about side effects. You may report side effects to FDA at 1-800-FDA-1088. Where should I keep my medicine? Keep out  of the reach of children. Store between 15 and 30 degrees C (59 and 86 degrees F). Throw away any unused medicine after the expiration date. NOTE: This sheet is a summary. It may not cover all possible information. If you have  questions about this medicine, talk to your doctor, pharmacist, or health care provider.  2012, [employer] Standard. (06/07/2007 4:43:44 PM)

## 2012-04-11 NOTE — Progress Notes (Signed)
OFFICE PROGRESS NOTE  CC Dr. Beather Arbour Dr. Cyndia Bent Dr. 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  PRIOR THERAPY:  #1 in 2014 patient presented with a breast mass on screening mammogram her staging was T1 C. She underwent a lumpectomy with sentinel lymph node biopsy the final pathology revealed a T1 C. N0 ER positive PR positive HER-2/neu positive breast cancer. She received post lumpectomy chemotherapy adjuvantly consisting of 6 cycles of 5-FU epirubicin and Cytoxan followed by radiation therapy. She then received tamoxifen adjuvantly for 5 years.  #2 patient continued to do well until April 2012 when she was found to have recurrent disease with metastasis to the bone and liver. The tumor was biopsied and she was HER-2 positive ER positive PR positive. Per patient's request she was initially treated with non-chemotherapy agents she was treated with Zoladex Faslodex and asked you go along with Herceptin.  #3 in November 2012 patient developed cardiomyopathy and Herceptin was discontinued. After normalization of ejection fraction in March 2013 she was started on Kadcyla with Improvement of Her Liver Disease and Markers  Until 10/27/2011 when she had progression of disease in the liver.  #4 on 01/10/2012 patient had a repeat liver biopsy that confirmed a low degree of HER-2 expression by both her marked and cish. Thereafter she received CMF with Herceptin every 3 weeks from 11/10/2011 through 01/19/2012 with continuation of Zoladex and Herceptin. However on 03/01/2012 Herceptin was discontinued when her marker assay became final.  #5 on 02/23/2012 she was started on single agent Abraxane. Dr. Donnie Coffin had also planned on restaging the patient with PET scan on 04/15/2012 with possibility of Y90 infusion by interventional.  CURRENT  THERAPY:Abraxane patient is here for cycle 6 but this will be held as she will undergo Y90 infusion next week   INTERVAL HISTORY: Kathy Jimenez 42 y.o. female returns for followup visit today. Overall she is doing well she however does have some tingling and numbness in her hands as well as her feet due to the Abraxane. She denies any nausea vomiting fevers chills night sweats headaches no shortness of breath chest pains no palpitations. She has no abdominal pain. Remainder of the 10 point review of systems is negative.  MEDICAL HISTORY: Past Medical History  Diagnosis Date  . 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 Kidney stones     recurrent since 1989  . Endometrial polyp   . PONV (postoperative nausea and vomiting)   . CHF (congestive heart failure)     from chemo treatment    ALLERGIES:  is allergic to sulfa antibiotics.  MEDICATIONS:  Current Outpatient Prescriptions  Medication Sig Dispense Refill  . carvedilol (COREG) 6.25 MG tablet Take 6.25 mg by mouth 2 (two) times daily with a meal.      . lidocaine-prilocaine (EMLA) cream Apply topically as needed.  30 g  1  . lisinopril (PRINIVIL,ZESTRIL) 10 MG tablet Take 1 tablet (10 mg total) by mouth 2 (two) times daily.  60 tablet  6  . LORazepam (ATIVAN) 0.5 MG tablet Take 1 tablet (0.5 mg total) by mouth every 6 (six) hours as needed (Nausea or vomiting).  30 tablet  0  . Potassium Citrate-Citric Acid (CYTRA K CRYSTALS) 3300-1002 MG PACK Take 2 packets by mouth 2 (two) times daily with a meal. Mix in 6 ounces of water or  juice.      . prochlorperazine (COMPAZINE) 10 MG tablet Take 1 tablet (10 mg total) by mouth every 6 (six) hours as needed (Nausea or vomiting).  30 tablet  1  . traMADol (ULTRAM) 50 MG tablet Take 1 tablet (50 mg total) by mouth every 6 (six) hours as needed for pain.  60 tablet  2   No current facility-administered medications for this visit.   Facility-Administered Medications  Ordered in Other Visits  Medication Dose Route Frequency Provider Last Rate Last Dose  . dexamethasone (DECADRON) injection 10 mg  10 mg Intravenous Once Pierce Crane, MD      . sodium chloride 0.9 % injection 10 mL  10 mL Intracatheter PRN Pierce Crane, MD   10 mL at 03/22/12 1737    SURGICAL HISTORY:  Past Surgical History  Procedure Date  . Hysteroscopy w/ endometrial ablation 03/11/2009    resection of endometrial polyps, total endometrial resection  . Breast lumpectomy 2004  . Cystoscopy with ureteroscopy 03/24/2011    Procedure: CYSTOSCOPY WITH URETEROSCOPY;  Surgeon: Garnett Farm, MD;  Location: [facility];  Service: Urology;  Laterality: Right;  dilitation of ureteral stricture    REVIEW OF SYSTEMS:  Pertinent items are noted in HPI.   HEALTH MAINTENANCE:  PHYSICAL EXAMINATION: Blood pressure 109/80, pulse 91, temperature 98.1 F (36.7 C), resp. rate 20, height 5\' 4"  (1.626 m), weight 142 lb 3.2 oz (64.501 kg). Body mass index is 24.41 kg/(m^2). ECOG PERFORMANCE STATUS: 1 - Symptomatic but completely ambulatory   General appearance: alert, cooperative and appears stated age Lymph nodes: Cervical, supraclavicular, and axillary nodes normal. Resp: clear to auscultation bilaterally Back: symmetric, no curvature. ROM normal. No CVA tenderness. Cardio: regular rate and rhythm GI: soft, non-tender; bowel sounds normal; no masses,  no organomegaly Extremities: extremities normal, atraumatic, no cyanosis or edema Neurologic: Grossly normal   LABORATORY DATA: Lab Results  Component Value Date   WBC 2.3* 04/11/2012   HGB 10.9* 04/11/2012   HCT 30.6* 04/11/2012   MCV 91.9 04/11/2012   PLT 156 04/11/2012      Chemistry      Component Value Date/Time   NA 143 04/05/2012 1530   NA 139 03/29/2012 1636   NA 141 09/02/2010 1134   K 3.9 04/05/2012 1530   K 4.4 03/29/2012 1636   K 4.3 09/02/2010 1134   CL 106 04/05/2012 1530   CL 103 03/29/2012 1636   CL 103 09/02/2010 1134   CO2 25  04/05/2012 1530   CO2 24 03/29/2012 1636   CO2 25 09/02/2010 1134   BUN 13.0 04/05/2012 1530   BUN 21 03/29/2012 1636   BUN 11 09/02/2010 1134   CREATININE 0.9 04/05/2012 1530   CREATININE 1.06 03/29/2012 1636   CREATININE 0.7 09/02/2010 1134      Component Value Date/Time   CALCIUM 9.6 04/05/2012 1530   CALCIUM 9.8 03/29/2012 1636   CALCIUM 8.4 09/02/2010 1134   ALKPHOS 74 04/05/2012 1530   ALKPHOS 74 03/29/2012 1636   ALKPHOS 84 09/02/2010 1134   AST 34 04/05/2012 1530   AST 38* 03/29/2012 1636   AST 27 09/02/2010 1134   ALT 21 04/05/2012 1530   ALT 29 03/29/2012 1636   BILITOT 0.40 04/05/2012 1530   BILITOT 0.4 03/29/2012 1636   BILITOT 0.60 09/02/2010 1134       RADIOGRAPHIC STUDIES:  No results found.  ASSESSMENT: 42 year old female with  #1 widely metastatic breast cancer initially tumors was ER positive PR positive  HER-2/neu positive. She is been treated with multiple agents including HER-2 based therapy with Herceptin as well as A Lot. She Has Also Received Systemic Chemotherapy Consisting of FEC Initially Adjuvantly. Recurrence Disease Patient Had CMF and Herceptin Every 3 Weeks. Most Recently She Was Treated with Abraxane Day 1 Day 8 Day 15 Basis. Her Last Dose Was on 04/05/2012.  #2 Patient Is Being Considered for Y. 90 Infusion and Therefore Her Abraxane Will Be Held. Arrangements Have Been Made with Interventional Radiology Specifically Dr. Fredia Sorrow.   PLAN:   #1 patient will proceed with staging studies with PET scan on 04/15/2012.  #2 she will also keep her appointment with Dr. Wallace Keller.  #3 patient and I discussed other treatment options. Lanney Gins has been treated with an aromatase inhibitor such as letrozole or Aromasin or Arimidex. Certainly she has received Faslodex as well as tamoxifen. But certainly she would also be a good candidate for one of the aromatase inhibitors. Patient is quite concerned about her quality of life and she is hesitant to go back  on chemotherapy certainly this is understandable. We will discuss this further as patient recovers from her Y90 infusion.   All questions were answered. The patient knows to call the clinic with any problems, questions or concerns. We can certainly see the patient much sooner if necessary.  I spent 60 minutes counseling the patient face to face. The total time spent in the appointment was 60 minutes.    Drue Second, MD Medical/Oncology [facility] [phone] (beeper) [phone] (Office)  04/11/2012, 8:49 AM

## 2012-04-12 ENCOUNTER — Encounter (HOSPITAL_COMMUNITY): Payer: Self-pay | Admitting: Pharmacy Technician

## 2012-04-12 ENCOUNTER — Ambulatory Visit: Payer: Self-pay

## 2012-04-12 ENCOUNTER — Other Ambulatory Visit: Payer: Self-pay | Admitting: Lab

## 2012-04-13 ENCOUNTER — Encounter: Payer: Self-pay | Admitting: Oncology

## 2012-04-15 ENCOUNTER — Encounter (HOSPITAL_COMMUNITY)
Admission: RE | Admit: 2012-04-15 | Discharge: 2012-04-15 | Disposition: A | Discharge status: Home / Self Care | Payer: BC Managed Care – PPO | Source: Ambulatory Visit | Attending: Oncology | Admitting: Oncology

## 2012-04-15 ENCOUNTER — Other Ambulatory Visit: Payer: Self-pay | Admitting: Radiology

## 2012-04-15 DIAGNOSIS — C50919 Malignant neoplasm of unspecified site of unspecified female breast: Secondary | ICD-10-CM | POA: Insufficient documentation

## 2012-04-15 LAB — GLUCOSE, CAPILLARY: Glucose-Capillary: 92 mg/dL (ref 70–99)

## 2012-04-15 MED ORDER — FLUDEOXYGLUCOSE F - 18 (FDG) INJECTION
17.5000 | Freq: Once | INTRAVENOUS | Status: AC | PRN
  Administered 2012-04-15: 17.5 via INTRAVENOUS

## 2012-04-18 ENCOUNTER — Encounter (HOSPITAL_COMMUNITY)
Admission: RE | Admit: 2012-04-18 | Discharge: 2012-04-18 | Disposition: A | Discharge status: Home / Self Care | Payer: BC Managed Care – PPO | Source: Ambulatory Visit | Attending: Interventional Radiology | Admitting: Interventional Radiology

## 2012-04-18 ENCOUNTER — Ambulatory Visit (HOSPITAL_COMMUNITY)
Admission: RE | Admit: 2012-04-18 | Discharge: 2012-04-18 | Disposition: A | Discharge status: Home / Self Care | Payer: BC Managed Care – PPO | Source: Ambulatory Visit | Attending: Interventional Radiology | Admitting: Interventional Radiology

## 2012-04-18 ENCOUNTER — Other Ambulatory Visit (HOSPITAL_COMMUNITY): Payer: Self-pay | Admitting: Interventional Radiology

## 2012-04-18 ENCOUNTER — Encounter (HOSPITAL_COMMUNITY): Payer: Self-pay

## 2012-04-18 ENCOUNTER — Ambulatory Visit (HOSPITAL_COMMUNITY): Payer: Self-pay

## 2012-04-18 DIAGNOSIS — C50919 Malignant neoplasm of unspecified site of unspecified female breast: Secondary | ICD-10-CM

## 2012-04-18 DIAGNOSIS — C787 Secondary malignant neoplasm of liver and intrahepatic bile duct: Secondary | ICD-10-CM

## 2012-04-18 DIAGNOSIS — Z79899 Other long term (current) drug therapy: Secondary | ICD-10-CM | POA: Insufficient documentation

## 2012-04-18 LAB — CBC WITH DIFFERENTIAL/PLATELET
Basophils Relative: 1 % (ref 0–1)
Eosinophils Absolute: 0.1 10*3/uL (ref 0.0–0.7)
Eosinophils Relative: 3 % (ref 0–5)
Hemoglobin: 10.5 g/dL — ABNORMAL LOW (ref 12.0–15.0)
Lymphs Abs: 0.8 10*3/uL (ref 0.7–4.0)
MCH: 30.7 pg (ref 26.0–34.0)
MCHC: 34.4 g/dL (ref 30.0–36.0)
MCV: 89.2 fL (ref 78.0–100.0)
Monocytes Absolute: 0.5 10*3/uL (ref 0.1–1.0)
Monocytes Relative: 17 % — ABNORMAL HIGH (ref 3–12)
RBC: 3.42 MIL/uL — ABNORMAL LOW (ref 3.87–5.11)

## 2012-04-18 LAB — COMPREHENSIVE METABOLIC PANEL
ALT: 23 U/L (ref 0–35)
AST: 38 U/L — ABNORMAL HIGH (ref 0–37)
Alkaline Phosphatase: 69 U/L (ref 39–117)
CO2: 23 mEq/L (ref 19–32)
Calcium: 9.1 mg/dL (ref 8.4–10.5)
GFR calc Af Amer: >90 mL/min (ref >90)
Glucose, Bld: 89 mg/dL (ref 70–99)
Potassium: 3.9 mEq/L (ref 3.5–5.1)
Sodium: 134 mEq/L — ABNORMAL LOW (ref 135–145)
Total Protein: 6.8 g/dL (ref 6.0–8.3)

## 2012-04-18 MED ORDER — HEPARIN SOD (PORK) LOCK FLUSH 100 UNIT/ML IV SOLN
500.0000 [IU] | Freq: Once | INTRAVENOUS | Status: AC
  Administered 2012-04-18: 500 [IU] via INTRAVENOUS

## 2012-04-18 MED ORDER — ONDANSETRON HCL 4 MG/2ML IJ SOLN
4.0000 mg | Freq: Once | INTRAMUSCULAR | Status: AC
  Administered 2012-04-18: 4 mg via INTRAVENOUS

## 2012-04-18 MED ORDER — TECHNETIUM TO 99M ALBUMIN AGGREGATED
4.7000 | Freq: Once | INTRAVENOUS | Status: AC | PRN
  Administered 2012-04-18: 4.7 via INTRAVENOUS

## 2012-04-18 MED ORDER — LIDOCAINE HCL 1 % IJ SOLN
INTRAMUSCULAR | Status: AC
  Filled 2012-04-18: qty 20

## 2012-04-18 MED ORDER — HEPARIN SOD (PORK) LOCK FLUSH 100 UNIT/ML IV SOLN
INTRAVENOUS | Status: AC
  Administered 2012-04-18: 500 [IU] via INTRAVENOUS
  Filled 2012-04-18: qty 5

## 2012-04-18 MED ORDER — MIDAZOLAM HCL 2 MG/2ML IJ SOLN
INTRAMUSCULAR | Status: AC
  Filled 2012-04-18: qty 6

## 2012-04-18 MED ORDER — SODIUM CHLORIDE 0.9 % IJ SOLN: 10.0000 mL | Freq: Once | INTRAMUSCULAR | Status: DC

## 2012-04-18 MED ORDER — FENTANYL CITRATE 0.05 MG/ML IJ SOLN
INTRAMUSCULAR | Status: AC
  Filled 2012-04-18: qty 6

## 2012-04-18 MED ORDER — SODIUM CHLORIDE 0.9 % IV SOLN
INTRAVENOUS | Status: DC
  Administered 2012-04-18: 09:00:00 via INTRAVENOUS

## 2012-04-18 MED ORDER — IOHEXOL 300 MG/ML  SOLN
100.0000 mL | Freq: Once | INTRAMUSCULAR | Status: AC | PRN
  Administered 2012-04-18: 77 mL via INTRA_ARTERIAL

## 2012-04-18 MED ORDER — FENTANYL CITRATE 0.05 MG/ML IJ SOLN
INTRAMUSCULAR | Status: AC | PRN
  Administered 2012-04-18 (×2): 100 ug via INTRAVENOUS

## 2012-04-18 MED ORDER — MIDAZOLAM HCL 2 MG/2ML IJ SOLN
INTRAMUSCULAR | Status: AC | PRN
  Administered 2012-04-18 (×6): 2 mg via INTRAVENOUS

## 2012-04-18 MED ORDER — ONDANSETRON HCL 4 MG/2ML IJ SOLN
INTRAMUSCULAR | Status: AC
  Administered 2012-04-18: 4 mg via INTRAVENOUS
  Filled 2012-04-18: qty 2

## 2012-04-18 NOTE — Progress Notes (Signed)
Vomited @150  cc cola and undigested crackers. Place call to Dr Fredia Sorrow for orders for antiemetic and orders received and Zofran given

## 2012-04-18 NOTE — H&P (Signed)
Chief Complaint: "I'm here for my Y-90" Referring Physician:Rubin HPI: Kathy Jimenez is an 42 y.o. female with metastatic breast cancer to the liver. She has been seen by Dr. Fredia Sorrow in consult for consideration of Y-90 treatment of the left lobe of the liver lesion, see PACS IR Eval for details. She has been approved and is scheduled for pre Y-90 angiogram today. She feels well otherwise, no recent changes to [facility] or meds. No recent illnesses or fevers. PMHX and meds reviewed.  Past Medical History:  Past Medical History  Diagnosis Date  . 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 Kidney stones     recurrent since 1989  . Endometrial polyp   . PONV (postoperative nausea and vomiting)   . CHF (congestive heart failure)     from chemo treatment    Past Surgical History:  Past Surgical History  Procedure Date  . Hysteroscopy w/ endometrial ablation 03/11/2009    resection of endometrial polyps, total endometrial resection  . Breast lumpectomy 2004  . Cystoscopy with ureteroscopy 03/24/2011    Procedure: CYSTOSCOPY WITH URETEROSCOPY;  Surgeon: Garnett Farm, MD;  Location: [facility];  Service: Urology;  Laterality: Right;  dilitation of ureteral stricture    Family History: History reviewed. No pertinent family history.  Social History:  reports that she has never smoked. She has never used smokeless tobacco. She reports that she drinks about .6 ounces of alcohol per week. She reports that she does not use illicit drugs.  Allergies:  Allergies  Allergen Reactions  . Sulfa Antibiotics Hives    Medications: carvedilol (COREG) 6.25 MG tablet Sig - Route: Take 6.25 mg by mouth 2 (two) times daily with a meal. - Oral Class: Historical Med Number of times this order has been changed since signing: 1 Order [address] lidocaine-prilocaine (EMLA) cream 03/15/2012 Sig - Route: Apply 1 application topically daily as needed. For port-a-cath. - Topical Class:  Historical Med Number of times this order has been changed since signing: 1 Order [address] lisinopril (PRINIVIL,ZESTRIL) 10 MG tablet 11/08/2011 11/07/2012 Sig - Route: Take 10 mg by mouth 2 (two) times daily. - Oral Class: Historical Med LORazepam (ATIVAN) 0.5 MG tablet 11/08/2011 05/06/2012 Sig - Route: Take 0.5 mg by mouth every 6 (six) hours as needed. For nausea or vomiting. - Oral Class: Historical Med Number of times this order has been changed since signing: 1 Order [address] Potassium Citrate-Citric Acid (CYTRA K CRYSTALS) 3300-1002 MG PACK Sig - Route: Take 1 packet by mouth 2 (two) times daily. Mix in 6 ounces of water or juice. - Oral Class: Historical Med Number of times this order has been changed since signing: 3 Order [address] prochlorperazine (COMPAZINE) 10 MG tablet 03/15/2012 Sig - Route: Take 10 mg by mouth every 6 (six) hours as needed. For nausea or vomiting. - Oral Class: Historical Med Number of times this order has been changed since signing: 1 Order [address] spironolactone (ALDACTONE) 25 MG tablet Sig - Route: Take 25 mg by mouth every morning. - Oral Class: Historical Med Number of times this order has been changed since signing: 1 Order [address] traMADol (ULTRAM) 50 MG tablet 02/27/2012 Sig - Route: Take 50 mg by mouth every 6 (six) hours as needed. For pain. - Oral Class: Historical Med   Please HPI for pertinent positives, otherwise complete 10 system ROS negative.  Physical Exam: Blood pressure 119/75, pulse 91, temperature 98.3 F (36.8  C), temperature source Oral, resp. rate 18, height 5\' 4"  (1.626 m), weight 142 lb 2 oz (64.467 kg), SpO2 99.00%. Body mass index is 24.40 kg/(m^2).   General Appearance:  Alert, cooperative, no distress, appears stated age  Head:  Normocephalic, without obvious abnormality, atraumatic  ENT: Unremarkable  Neck: Supple, symmetrical, trachea midline, no adenopathy, thyroid: not enlarged, symmetric, no tenderness/mass/nodules  Lungs:    Clear to auscultation bilaterally, no w/r/r, respirations unlabored without use of accessory muscles.  Chest Wall:  (R) port intact, NT  Heart:  Regular rate and rhythm, S1, S2 normal, no murmur, rub or gallop. Carotids 2+ without bruit.  Abdomen:   Soft, non-tender, non distended. Bowel sounds active all four quadrants,  no masses, no organomegaly.  Extremities: Extremities normal, atraumatic, no cyanosis or edema  Pulses: 2+ and symmetric  Skin: Skin color, texture, turgor normal, no jaundice  Neurologic: Normal affect, no gross deficits.   Results for orders placed during the hospital encounter of 04/18/12 (from the past 48 hour(s))  COMPREHENSIVE METABOLIC PANEL     Status: Abnormal   Collection Time   04/18/12  8:20 AM      Component Value Range Comment   Sodium 134 (*) 135 - 145 mEq/L    Potassium 3.9  3.5 - 5.1 mEq/L    Chloride 103  96 - 112 mEq/L    CO2 23  19 - 32 mEq/L    Glucose, Bld 89  70 - 99 mg/dL    BUN 17  6 - 23 mg/dL    Creatinine, Ser 6.21  0.50 - 1.10 mg/dL    Calcium 9.1  8.4 - 30.8 mg/dL    Total Protein 6.8  6.0 - 8.3 g/dL    Albumin 3.5  3.5 - 5.2 g/dL    AST 38 (*) 0 - 37 U/L    ALT 23  0 - 35 U/L    Alkaline Phosphatase 69  39 - 117 U/L    Total Bilirubin 0.4  0.3 - 1.2 mg/dL    GFR calc non Af Amer >90  >90 mL/min    GFR calc Af Amer >90  >90 mL/min   APTT     Status: Normal   Collection Time   04/18/12  8:20 AM      Component Value Range Comment   aPTT 28  24 - 37 seconds   CBC WITH DIFFERENTIAL     Status: Abnormal   Collection Time   04/18/12  8:20 AM      Component Value Range Comment   WBC 2.9 (*) 4.0 - 10.5 K/uL    RBC 3.42 (*) 3.87 - 5.11 MIL/uL    Hemoglobin 10.5 (*) 12.0 - 15.0 g/dL    HCT 65.7 (*) 84.6 - 46.0 %    MCV 89.2  78.0 - 100.0 fL    MCH 30.7  26.0 - 34.0 pg    MCHC 34.4  30.0 - 36.0 g/dL    RDW 96.2 (*) 95.2 - 15.5 %    Platelets 205  150 - 400 K/uL    Neutrophils Relative 50  43 - 77 %    Neutro Abs 1.5 (*) 1.7 - 7.7 K/uL     Lymphocytes Relative 29  12 - 46 %    Lymphs Abs 0.8  0.7 - 4.0 K/uL    Monocytes Relative 17 (*) 3 - 12 %    Monocytes Absolute 0.5  0.1 - 1.0 K/uL    Eosinophils Relative 3  0 - 5 %    Eosinophils Absolute 0.1  0.0 - 0.7 K/uL    Basophils Relative 1  0 - 1 %    Basophils Absolute 0.0  0.0 - 0.1 K/uL   PROTIME-INR     Status: Normal   Collection Time   04/18/12  8:20 AM      Component Value Range Comment   Prothrombin Time 12.7  11.6 - 15.2 seconds    INR 0.96  0.00 - 1.49    No results found.  Assessment/Plan Breast cancer with liver mets. Plan for pre Y-90 angiogram today Discussed procedure and risks. Labs reviewed. Consent signed in chart  Brayton El PA-C 04/18/2012, 9:32 AM

## 2012-04-18 NOTE — Progress Notes (Signed)
Pt states she has a port right chest "but it is tilted" and prefers to have peripheral IV site. Lab work drawn peripherally and tubed to lab

## 2012-04-18 NOTE — Procedures (Signed)
Procedure:  Hepatic and left gastric arteriography with embolization of left gastric arterial branches Access:  Right CFA, 5Fr sheath Findings:  Left hepatic arterial supply off of left gastric artery.  3 separate third order left gastric arterial trunks embolized with coils.  MAA injected into left hepatic artery. For NM scanning of abdomen and shunt calculation to follow.

## 2012-04-18 NOTE — ED Notes (Signed)
5Fr R Fem artery sheath removed by Jerral Bonito, RT.  R groin level 0.

## 2012-04-18 NOTE — Progress Notes (Signed)
Bed elevated to 30 degrees. Patient has no complaints.

## 2012-04-18 NOTE — H&P (Signed)
Agree 

## 2012-04-19 ENCOUNTER — Other Ambulatory Visit: Payer: BC Managed Care – PPO | Admitting: Lab

## 2012-04-19 ENCOUNTER — Telehealth: Payer: Self-pay | Admitting: Oncology

## 2012-04-19 ENCOUNTER — Encounter: Payer: Self-pay | Admitting: Oncology

## 2012-04-19 ENCOUNTER — Ambulatory Visit (HOSPITAL_BASED_OUTPATIENT_CLINIC_OR_DEPARTMENT_OTHER): Payer: BC Managed Care – PPO | Admitting: Oncology

## 2012-04-19 VITALS — BP 114/80 | HR 80 | Temp 98.1°F | Resp 20 | Ht 64.0 in | Wt 144.2 lb

## 2012-04-19 DIAGNOSIS — C7952 Secondary malignant neoplasm of bone marrow: Secondary | ICD-10-CM

## 2012-04-19 DIAGNOSIS — C787 Secondary malignant neoplasm of liver and intrahepatic bile duct: Secondary | ICD-10-CM

## 2012-04-19 DIAGNOSIS — C50919 Malignant neoplasm of unspecified site of unspecified female breast: Secondary | ICD-10-CM

## 2012-04-19 MED ORDER — LETROZOLE 2.5 MG PO TABS: 2.5000 mg | ORAL_TABLET | Freq: Every day | ORAL | Status: DC

## 2012-04-19 NOTE — Telephone Encounter (Signed)
apps made and printed for pt aom °

## 2012-04-19 NOTE — Patient Instructions (Addendum)
Proceed with letrozole beginning on 05/11/12  I will see you back on 2/10 for follow up

## 2012-04-19 NOTE — Progress Notes (Signed)
I was going to enroll pt for assistance for Abraxane thru [employer] but pt informed me that she is no longer taking Abraxane.  I advised pt that if she goes back on it to please let me know so we can get her enrolled in the program.

## 2012-04-22 ENCOUNTER — Inpatient Hospital Stay (HOSPITAL_COMMUNITY): Admission: RE | Admit: 2012-04-22 | Payer: Self-pay | Source: Ambulatory Visit

## 2012-04-24 ENCOUNTER — Other Ambulatory Visit: Payer: Self-pay | Admitting: Radiology

## 2012-04-26 ENCOUNTER — Ambulatory Visit (HOSPITAL_BASED_OUTPATIENT_CLINIC_OR_DEPARTMENT_OTHER): Payer: BC Managed Care – PPO

## 2012-04-26 ENCOUNTER — Encounter (HOSPITAL_COMMUNITY): Payer: Self-pay | Admitting: Pharmacy Technician

## 2012-04-26 VITALS — BP 125/64 | HR 84 | Temp 97.9°F | Resp 16

## 2012-04-26 DIAGNOSIS — C7952 Secondary malignant neoplasm of bone marrow: Secondary | ICD-10-CM

## 2012-04-26 DIAGNOSIS — C50919 Malignant neoplasm of unspecified site of unspecified female breast: Secondary | ICD-10-CM

## 2012-04-26 DIAGNOSIS — Z5111 Encounter for antineoplastic chemotherapy: Secondary | ICD-10-CM

## 2012-04-26 DIAGNOSIS — C787 Secondary malignant neoplasm of liver and intrahepatic bile duct: Secondary | ICD-10-CM

## 2012-04-26 MED ORDER — GOSERELIN ACETATE 3.6 MG ~~LOC~~ IMPL
3.6000 mg | DRUG_IMPLANT | Freq: Once | SUBCUTANEOUS | Status: AC
  Administered 2012-04-26: 3.6 mg via SUBCUTANEOUS
  Filled 2012-04-26: qty 3.6

## 2012-04-26 MED ORDER — DENOSUMAB 120 MG/1.7ML ~~LOC~~ SOLN
120.0000 mg | Freq: Once | SUBCUTANEOUS | Status: AC
  Administered 2012-04-26: 120 mg via SUBCUTANEOUS
  Filled 2012-04-26: qty 1.7

## 2012-04-29 ENCOUNTER — Other Ambulatory Visit: Payer: Self-pay | Admitting: Radiology

## 2012-04-29 NOTE — Progress Notes (Signed)
OFFICE PROGRESS NOTE  CC Dr. Beather Arbour Dr. Cyndia Bent Dr. 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  PRIOR THERAPY:  #1 in 2014 patient presented with a breast mass on screening mammogram her staging was T1 C. She underwent a lumpectomy with sentinel lymph node biopsy the final pathology revealed a T1 C. N0 ER positive PR positive HER-2/neu positive breast cancer. She received post lumpectomy chemotherapy adjuvantly consisting of 6 cycles of 5-FU epirubicin and Cytoxan followed by radiation therapy. She then received tamoxifen adjuvantly for 5 years.  #2 patient continued to do well until April 2012 when she was found to have recurrent disease with metastasis to the bone and liver. The tumor was biopsied and she was HER-2 positive ER positive PR positive. Per patient's request she was initially treated with non-chemotherapy agents she was treated with Zoladex Faslodex and asked you go along with Herceptin.  #3 in November 2012 patient developed cardiomyopathy and Herceptin was discontinued. After normalization of ejection fraction in March 2013 she was started on Kadcyla with Improvement of Her Liver Disease and Markers  Until 10/27/2011 when she had progression of disease in the liver.  #4 on 01/10/2012 patient had a repeat liver biopsy that confirmed a low degree of HER-2 expression by both her marked and cish. Thereafter she received CMF with Herceptin every 3 weeks from 11/10/2011 through 01/19/2012 with continuation of Zoladex and Herceptin. However on 03/01/2012 Herceptin was discontinued when her marker assay became final.  #5 on 02/23/2012 she was started on single agent Abraxane She received a total of 6 cycles last cycle given was in December 2030 T. And.   #6 patient had a PET scan performed on 04/15/2012 that reveals good  response to Abraxane.  #7 patient is now status post Y 90 infusion on 04/18/2012. She tolerated it well.  #8 patient continues to receive Zoladex and Xgeva q 4 weeks  CURRENT THERAPY:Letrozole 2.5 mg to begin 05/11/2012.  INTERVAL HISTORY: Kathy Jimenez 42 y.o. female returns for followup visit today. She is doing well she is without any complaints. She continues to receive Zoladex and BX to go without any problems. She denies any fevers chills night sweats headaches shortness of breath chest pains palpitations no myalgias and arthralgias.She has no bruising or bleeding. Remainder of the 10 point review of systems is negative  MEDICAL HISTORY: Past Medical History  Diagnosis Date  . 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 Kidney stones     recurrent since 1989  . Endometrial polyp   . PONV (postoperative nausea and vomiting)   . CHF (congestive heart failure)     from chemo treatment    ALLERGIES:  is allergic to sulfa antibiotics.  MEDICATIONS:  Current Outpatient Prescriptions  Medication Sig Dispense Refill  . carvedilol (COREG) 6.25 MG tablet Take 6.25 mg by mouth 2 (two) times daily with a meal.      . lidocaine-prilocaine (EMLA) cream Apply 1 application topically daily as needed. For port-a-cath.      . lisinopril (PRINIVIL,ZESTRIL) 10 MG tablet Take 10 mg by mouth 2 (two) times daily.      Marland Kitchen LORazepam (ATIVAN) 0.5 MG tablet Take 0.5 mg by mouth every 6 (six) hours as needed. For nausea or vomiting.      . Potassium Citrate-Citric Acid (CYTRA K CRYSTALS) 3300-1002 MG PACK Take  1 packet by mouth daily. Mix in 6 ounces of water or juice.      . prochlorperazine (COMPAZINE) 10 MG tablet Take 10 mg by mouth every 6 (six) hours as needed. For nausea or vomiting.      Marland Kitchen spironolactone (ALDACTONE) 25 MG tablet Take 25 mg by mouth every morning.      . traMADol (ULTRAM) 50 MG tablet Take 50 mg by mouth every 6 (six) hours as needed. For pain.      Marland Kitchen  letrozole (FEMARA) 2.5 MG tablet Take 1 tablet (2.5 mg total) by mouth daily.  90 tablet  6   No current facility-administered medications for this visit.   Facility-Administered Medications Ordered in Other Visits  Medication Dose Route Frequency Provider Last Rate Last Dose  . sodium chloride 0.9 % injection 10 mL  10 mL Intracatheter PRN Pierce Crane, MD   10 mL at 03/22/12 1737    SURGICAL HISTORY:  Past Surgical History  Procedure Date  . Hysteroscopy w/ endometrial ablation 03/11/2009    resection of endometrial polyps, total endometrial resection  . Breast lumpectomy 2004  . Cystoscopy with ureteroscopy 03/24/2011    Procedure: CYSTOSCOPY WITH URETEROSCOPY;  Surgeon: Garnett Farm, MD;  Location: [facility];  Service: Urology;  Laterality: Right;  dilitation of ureteral stricture    REVIEW OF SYSTEMS:  Pertinent items are noted in HPI.   HEALTH MAINTENANCE:  PHYSICAL EXAMINATION: Blood pressure 114/80, pulse 80, temperature 98.1 F (36.7 C), resp. rate 20, height 5\' 4"  (1.626 m), weight 144 lb 3.2 oz (65.409 kg). Body mass index is 24.75 kg/(m^2). ECOG PERFORMANCE STATUS: 1 - Symptomatic but completely ambulatory   General appearance: alert, cooperative and appears stated age Lymph nodes: Cervical, supraclavicular, and axillary nodes normal. Resp: clear to auscultation bilaterally Back: symmetric, no curvature. ROM normal. No CVA tenderness. Cardio: regular rate and rhythm GI: soft, non-tender; bowel sounds normal; no masses,  no organomegaly Extremities: extremities normal, atraumatic, no cyanosis or edema Neurologic: Grossly normal   LABORATORY DATA: Lab Results  Component Value Date   WBC 2.9* 04/18/2012   HGB 10.5* 04/18/2012   HCT 30.5* 04/18/2012   MCV 89.2 04/18/2012   PLT 205 04/18/2012      Chemistry      Component Value Date/Time   NA 134* 04/18/2012 0820   NA 139 04/11/2012 0815   NA 141 09/02/2010 1134   K 3.9 04/18/2012 0820   K 4.7 04/11/2012 0815   K 4.3  09/02/2010 1134   CL 103 04/18/2012 0820   CL 106 04/11/2012 0815   CL 103 09/02/2010 1134   CO2 23 04/18/2012 0820   CO2 24 04/11/2012 0815   CO2 25 09/02/2010 1134   BUN 17 04/18/2012 0820   BUN 18.0 04/11/2012 0815   BUN 11 09/02/2010 1134   CREATININE 0.72 04/18/2012 0820   CREATININE 0.8 04/11/2012 0815   CREATININE 0.7 09/02/2010 1134      Component Value Date/Time   CALCIUM 9.1 04/18/2012 0820   CALCIUM 9.3 04/11/2012 0815   CALCIUM 8.4 09/02/2010 1134   ALKPHOS 69 04/18/2012 0820   ALKPHOS 74 04/11/2012 0815   ALKPHOS 84 09/02/2010 1134   AST 38* 04/18/2012 0820   AST 45* 04/11/2012 0815   AST 27 09/02/2010 1134   ALT 23 04/18/2012 0820   ALT 40 04/11/2012 0815   BILITOT 0.4 04/18/2012 0820   BILITOT 0.65 04/11/2012 0815   BILITOT 0.60 09/02/2010 1134  RADIOGRAPHIC STUDIES:  No results found.  ASSESSMENT: 42 year old female with  #1 widely metastatic breast cancer initially tumors was ER positive PR positive HER-2/neu positive. She is been treated with multiple agents including HER-2 based therapy with Herceptin as well as A Lot. She Has Also Received Systemic Chemotherapy Consisting of FEC Initially Adjuvantly. Recurrence Disease Patient Had CMF and Herceptin Every 3 Weeks. Most Recently She Was Treated with Abraxane Day 1 Day 8 Day 15 Basis. Her Last Dose Was on 04/05/2012.  #2 Patient has had her infusion of Y90 overall she tolerated it well. This was performed on 04/18/2012. She did have a CBC performed which looked terrific.  #3 we again discussed anti-estrogen therapy with letrozole 2.5 mg daily she is interested in this and a prescription was given to her.  PLAN:   #1 overall patient is doing well she tolerated to Y 90 infusion well without any problems.  #2 I have given her a prescription for letrozole 2.5 mg daily as well as literature. I have recommended that she start this in early February 2014.  All questions were answered. The patient knows to call the clinic with any problems,  questions or concerns. We can certainly see the patient much sooner if necessary.  I spent  15 minutes counseling the patient face to face. The total time spent in the appointment was 30 minutes.    Drue Second, MD Medical/Oncology [facility] [phone] (beeper) [phone] (Office)

## 2012-04-30 ENCOUNTER — Inpatient Hospital Stay (HOSPITAL_COMMUNITY): Admission: RE | Admit: 2012-04-30 | Payer: Self-pay | Source: Ambulatory Visit

## 2012-04-30 ENCOUNTER — Encounter (HOSPITAL_COMMUNITY)
Admission: RE | Admit: 2012-04-30 | Discharge: 2012-04-30 | Disposition: A | Discharge status: Home / Self Care | Payer: BC Managed Care – PPO | Source: Ambulatory Visit | Attending: Interventional Radiology | Admitting: Interventional Radiology

## 2012-04-30 ENCOUNTER — Encounter (HOSPITAL_COMMUNITY): Payer: Self-pay

## 2012-04-30 ENCOUNTER — Other Ambulatory Visit (HOSPITAL_COMMUNITY): Payer: Self-pay | Admitting: Interventional Radiology

## 2012-04-30 ENCOUNTER — Ambulatory Visit (HOSPITAL_COMMUNITY)
Admission: RE | Admit: 2012-04-30 | Discharge: 2012-04-30 | Disposition: A | Discharge status: Home / Self Care | Payer: BC Managed Care – PPO | Source: Ambulatory Visit | Attending: Interventional Radiology | Admitting: Interventional Radiology

## 2012-04-30 DIAGNOSIS — C50919 Malignant neoplasm of unspecified site of unspecified female breast: Secondary | ICD-10-CM

## 2012-04-30 DIAGNOSIS — C787 Secondary malignant neoplasm of liver and intrahepatic bile duct: Secondary | ICD-10-CM

## 2012-04-30 DIAGNOSIS — C7900 Secondary malignant neoplasm of unspecified kidney and renal pelvis: Secondary | ICD-10-CM | POA: Insufficient documentation

## 2012-04-30 DIAGNOSIS — C7951 Secondary malignant neoplasm of bone: Secondary | ICD-10-CM | POA: Insufficient documentation

## 2012-04-30 LAB — CBC
HCT: 30.7 % — ABNORMAL LOW (ref 36.0–46.0)
Hemoglobin: 10.6 g/dL — ABNORMAL LOW (ref 12.0–15.0)
MCH: 31.3 pg (ref 26.0–34.0)
MCV: 90.6 fL (ref 78.0–100.0)
RBC: 3.39 MIL/uL — ABNORMAL LOW (ref 3.87–5.11)
WBC: 5.5 10*3/uL (ref 4.0–10.5)

## 2012-04-30 LAB — PROTIME-INR: INR: 1.05 (ref 0.00–1.49)

## 2012-04-30 LAB — COMPREHENSIVE METABOLIC PANEL
BUN: 12 mg/dL (ref 6–23)
CO2: 24 mEq/L (ref 19–32)
Calcium: 8.8 mg/dL (ref 8.4–10.5)
Chloride: 102 mEq/L (ref 96–112)
Creatinine, Ser: 0.72 mg/dL (ref 0.50–1.10)
GFR calc Af Amer: >90 mL/min (ref >90)
GFR calc non Af Amer: >90 mL/min (ref >90)
Glucose, Bld: 91 mg/dL (ref 70–99)
Total Bilirubin: 0.4 mg/dL (ref 0.3–1.2)

## 2012-04-30 MED ORDER — FENTANYL CITRATE 0.05 MG/ML IJ SOLN
INTRAMUSCULAR | Status: AC
  Filled 2012-04-30: qty 8

## 2012-04-30 MED ORDER — MIDAZOLAM HCL 2 MG/2ML IJ SOLN
INTRAMUSCULAR | Status: AC
  Filled 2012-04-30: qty 8

## 2012-04-30 MED ORDER — ONDANSETRON HCL 4 MG/2ML IJ SOLN
INTRAMUSCULAR | Status: AC
  Filled 2012-04-30: qty 2

## 2012-04-30 MED ORDER — KETOROLAC TROMETHAMINE 30 MG/ML IJ SOLN
30.0000 mg | Freq: Once | INTRAMUSCULAR | Status: AC
  Administered 2012-04-30: 30 mg via INTRAVENOUS

## 2012-04-30 MED ORDER — DEXAMETHASONE SODIUM PHOSPHATE 10 MG/ML IJ SOLN
INTRAMUSCULAR | Status: AC
  Filled 2012-04-30: qty 2

## 2012-04-30 MED ORDER — PANTOPRAZOLE SODIUM 40 MG IV SOLR
40.0000 mg | Freq: Once | INTRAVENOUS | Status: AC
  Administered 2012-04-30: 40 mg via INTRAVENOUS

## 2012-04-30 MED ORDER — HEPARIN SOD (PORK) LOCK FLUSH 100 UNIT/ML IV SOLN
500.0000 [IU] | INTRAVENOUS | Status: AC | PRN
  Administered 2012-04-30: 500 [IU]

## 2012-04-30 MED ORDER — SODIUM CHLORIDE 0.45 % IV SOLN
INTRAVENOUS | Status: DC
  Administered 2012-04-30 (×2): via INTRAVENOUS

## 2012-04-30 MED ORDER — PROMETHAZINE HCL 25 MG/ML IJ SOLN
12.5000 mg | Freq: Four times a day (QID) | INTRAMUSCULAR | Status: DC | PRN
  Filled 2012-04-30: qty 1

## 2012-04-30 MED ORDER — PANTOPRAZOLE SODIUM 40 MG IV SOLR
40.0000 mg | Freq: Once | INTRAVENOUS | Status: DC
  Filled 2012-04-30: qty 40

## 2012-04-30 MED ORDER — GI COCKTAIL ~~LOC~~
30.0000 mL | Freq: Once | ORAL | Status: AC
  Administered 2012-04-30: 30 mL via ORAL

## 2012-04-30 MED ORDER — PIPERACILLIN-TAZOBACTAM 3.375 G IVPB
3.3750 g | Freq: Once | INTRAVENOUS | Status: AC
  Administered 2012-04-30: 3.375 g via INTRAVENOUS
  Filled 2012-04-30: qty 50

## 2012-04-30 MED ORDER — ONDANSETRON HCL 4 MG/2ML IJ SOLN
4.0000 mg | Freq: Once | INTRAMUSCULAR | Status: AC
  Administered 2012-04-30: 4 mg via INTRAVENOUS

## 2012-04-30 MED ORDER — IOHEXOL 300 MG/ML  SOLN: 80.0000 mL | Freq: Once | INTRAMUSCULAR | Status: DC | PRN

## 2012-04-30 MED ORDER — KETOROLAC TROMETHAMINE 30 MG/ML IJ SOLN
INTRAMUSCULAR | Status: AC
  Filled 2012-04-30: qty 1

## 2012-04-30 MED ORDER — PANTOPRAZOLE SODIUM 40 MG IV SOLR
40.0000 mg | Freq: Once | INTRAVENOUS | Status: AC
  Administered 2012-04-30: 40 mg via INTRAVENOUS
  Filled 2012-04-30: qty 40

## 2012-04-30 MED ORDER — FENTANYL CITRATE 0.05 MG/ML IJ SOLN
INTRAMUSCULAR | Status: AC | PRN
  Administered 2012-04-30 (×3): 100 ug via INTRAVENOUS

## 2012-04-30 MED ORDER — DEXAMETHASONE SODIUM PHOSPHATE 10 MG/ML IJ SOLN
20.0000 mg | Freq: Once | INTRAMUSCULAR | Status: AC
  Administered 2012-04-30: 20 mg via INTRAVENOUS

## 2012-04-30 MED ORDER — PANTOPRAZOLE SODIUM 40 MG IV SOLR
INTRAVENOUS | Status: AC
  Filled 2012-04-30: qty 40

## 2012-04-30 MED ORDER — HYDROMORPHONE HCL PF 2 MG/ML IJ SOLN
INTRAMUSCULAR | Status: AC
  Filled 2012-04-30: qty 1

## 2012-04-30 MED ORDER — KETOROLAC TROMETHAMINE 30 MG/ML IJ SOLN
30.0000 mg | INTRAMUSCULAR | Status: AC
  Administered 2012-04-30: 30 mg via INTRAVENOUS

## 2012-04-30 MED ORDER — HEPARIN SOD (PORK) LOCK FLUSH 100 UNIT/ML IV SOLN
INTRAVENOUS | Status: AC
  Administered 2012-04-30: 500 [IU]
  Filled 2012-04-30: qty 5

## 2012-04-30 MED ORDER — YTTRIUM 90 INJECTION: 21.7500 | INJECTION | Freq: Once | INTRAVENOUS | Status: DC

## 2012-04-30 MED ORDER — HYDROMORPHONE HCL PF 2 MG/ML IJ SOLN
1.0000 mg | INTRAMUSCULAR | Status: AC | PRN
  Administered 2012-04-30: 0.5 mg via INTRAVENOUS
  Administered 2012-04-30: 0.25 mg via INTRAVENOUS

## 2012-04-30 MED ORDER — GI COCKTAIL ~~LOC~~
30.0000 mL | Freq: Once | ORAL | Status: DC
  Filled 2012-04-30: qty 30

## 2012-04-30 MED ORDER — PROMETHAZINE HCL 25 MG/ML IJ SOLN
12.5000 mg | Freq: Four times a day (QID) | INTRAMUSCULAR | Status: DC | PRN
  Administered 2012-04-30: 12.5 mg via INTRAVENOUS

## 2012-04-30 MED ORDER — MIDAZOLAM HCL 2 MG/2ML IJ SOLN
INTRAMUSCULAR | Status: AC | PRN
  Administered 2012-04-30 (×3): 2 mg via INTRAVENOUS

## 2012-04-30 MED ORDER — KETOROLAC TROMETHAMINE 30 MG/ML IJ SOLN
30.0000 mg | Freq: Once | INTRAMUSCULAR | Status: DC
  Filled 2012-04-30: qty 1

## 2012-04-30 NOTE — H&P (Signed)
Kathy Jimenez is an 42 y.o. female.   Chief Complaint: I am here for the second phase of my Y-90 procedure.  HPI: Breast cancer with metastatic lesion to liver s/p embolization of the gastric artery with shunt fraction calculation performed on 04/18/12.  Patient presents today for repeat angiogram with placement of spheres to treat her hepatic lesion. No new significant changes since last procedure in IR.  Full consultation note in IR PACS.   Past Medical History  Diagnosis Date  . 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 Kidney stones     recurrent since 1989  . Endometrial polyp   . PONV (postoperative nausea and vomiting)   . CHF (congestive heart failure)     from chemo treatment    Past Surgical History  Procedure Date  . Hysteroscopy w/ endometrial ablation 03/11/2009    resection of endometrial polyps, total endometrial resection  . Breast lumpectomy 2004  . Cystoscopy with ureteroscopy 03/24/2011    Procedure: CYSTOSCOPY WITH URETEROSCOPY;  Surgeon: Garnett Farm, MD;  Location: [facility];  Service: Urology;  Laterality: Right;  dilitation of ureteral stricture    No family history on file. Social History:  reports that she has never smoked. She has never used smokeless tobacco. She reports that she drinks about .6 ounces of alcohol per week. She reports that she does not use illicit drugs.    Medication List     As of 04/30/2012  8:37 AM    ASK your doctor about these medications         carvedilol 6.25 MG tablet   Commonly known as: COREG   Take 6.25 mg by mouth 2 (two) times daily with a meal.      Cytra K Crystals 3300-1002 MG Pack   Generic drug: Potassium Citrate-Citric Acid   Take 1 packet by mouth daily. Mix in 6 ounces of water or juice.      letrozole 2.5 MG tablet   Commonly known as: FEMARA   Take 1 tablet (2.5 mg total) by mouth daily.      lidocaine-prilocaine cream   Commonly known as: EMLA   Apply 1 application topically  daily as needed. For port-a-cath.      lisinopril 10 MG tablet   Commonly known as: PRINIVIL,ZESTRIL   Take 10 mg by mouth 2 (two) times daily.      LORazepam 0.5 MG tablet   Commonly known as: ATIVAN   Take 0.5 mg by mouth every 6 (six) hours as needed. For nausea or vomiting.      prochlorperazine 10 MG tablet   Commonly known as: COMPAZINE   Take 10 mg by mouth every 6 (six) hours as needed. For nausea or vomiting.      spironolactone 25 MG tablet   Commonly known as: ALDACTONE   Take 25 mg by mouth every morning.      traMADol 50 MG tablet   Commonly known as: ULTRAM   Take 50 mg by mouth every 6 (six) hours as needed. For pain.         Allergies:  Allergies  Allergen Reactions  . Sulfa Antibiotics Hives   Results for Kathy Jimenez, Kathy Jimenez (MRN [id number]) as of 04/30/2012 08:37  Ref. Range 04/30/2012 07:55  Sodium Latest Range: 136-145 mEq/L 135  Potassium Latest Range: 3.5-5.1 mEq/L 3.8  Chloride Latest Range: 96-112 mEq/L 102  CO2 Latest Range: 19-32 mEq/L 24  BUN Latest Range: 7.0-26.0  mg/dL 12  Creatinine Latest Range: 0.50-1.10 mg/dL 1.30  Calcium Latest Range: 8.4-10.5 mg/dL 8.8  GFR calc non Af Amer Latest Range: >90 mL/min >90  GFR calc Af Amer Latest Range: >90 mL/min >90  Glucose Latest Range: 70-99 mg/dl 91  Alkaline Phosphatase Latest Range: 39-117 U/L 108  Albumin Latest Range: 3.3-5.5 g/dL 3.3 (L)  AST Latest Range: 0-37 U/L 56 (H)  ALT Latest Range: 0-35 U/L 36 (H)  Total Protein Latest Range: 6.4-8.3 g/dL 6.8  Total Bilirubin Latest Range: 0.3-1.2 mg/dL 0.4  WBC Latest Range: 4.0-10.5 K/uL 5.5  RBC Latest Range: 3.87-5.11 MIL/uL 3.39 (L)  Hemoglobin Latest Range: 12.0-15.0 g/dL 86.5 (L)  HCT Latest Range: 36.0-46.0 % 30.7 (L)  MCV Latest Range: 78.0-100.0 fL 90.6  MCH Latest Range: 26.0-34.0 pg 31.3  MCHC Latest Range: 30.0-36.0 g/dL 78.4  RDW Latest Range: 11.5-15.5 % 15.0  Platelets Latest Range: 150-400 K/uL 179  Prothrombin Time Latest  Range: 11.6-15.2 seconds 13.6  INR Latest Range: 0.00-1.49  1.05  aPTT Latest Range: 24-37 seconds 67 (H)     Review of Systems  Constitutional: Negative.   Respiratory: Negative.   Cardiovascular: Negative.   Gastrointestinal: Negative.   Musculoskeletal: Negative.   Skin: Negative.   Neurological: Negative.   Endo/Heme/Allergies: Negative.   Psychiatric/Behavioral: Negative.     Physical Exam  Constitutional: She is oriented to person, place, and time. She appears well-developed and well-nourished. No distress.  HENT:  Head: Normocephalic and atraumatic.  Neck: Normal range of motion.  Cardiovascular: Regular rhythm, normal heart sounds and intact distal pulses.  Exam reveals no gallop and no friction rub.   No murmur heard.      Tachycardia   Respiratory: Effort normal and breath sounds normal. No respiratory distress. She has no wheezes. She has no rales. She exhibits no tenderness.  GI: Soft. Bowel sounds are normal. She exhibits no distension. There is no tenderness.  Musculoskeletal: Normal range of motion. She exhibits no edema.  Lymphadenopathy:    She has no cervical adenopathy.  Neurological: She is alert and oriented to person, place, and time.  Skin: Skin is warm and dry.  Psychiatric: She has a normal mood and affect. Her behavior is normal. Judgment and thought content normal.     Assessment/Plan Procedure details reviewed with patient and all questions answered to the patient's satisfaction. Labs have been reviewed and are appropriate for proceeding today with repeat angiogram and sphere placement. Written consent obtained   Jaizon Deroos D 04/30/2012, 8:37 AM

## 2012-04-30 NOTE — Procedures (Signed)
Procedure:  Hepatic arteriography with Y-90 radioembolization of left lobe of liver Access:  Right CFA, 5 Fr sheath Findings:  Durable embolization of left gastric artery trunks after prior coil embolization.  Left hepatic artery patent.  25 mCi dose of SIRSpheres administered in left hepatic artery.  No complications. Plan:  4 to 6 hour recovery.

## 2012-04-30 NOTE — H&P (Signed)
Agree.  For Y-90 radioembolization of left hepatic artery today.

## 2012-04-30 NOTE — Progress Notes (Signed)
Pt C/O abdominal pain radiating to the back with increasing frequency. Dr. Fredia Sorrow notified and dilaudid given. Pt requested that it be given very slowly. A test dose was given and Dr. York Spaniel to give the full dose. Pt was unable to tolerate. She had two episodes of nausea and vomiting.  The IR PA came to evaluate pt and meds were ordered and given with good results. The pt states that her abdominal pain is much less and nausea is tolerable at 16:50.  Dr. Fredia Sorrow came to the bedside to talk to pt and family. He feels that it is okay for pt to be discharged home at 17:30. Report given to [facility] RN for shift change at 17:35

## 2012-04-30 NOTE — Progress Notes (Signed)
Subjective: Pt with hx breast Ca with mets to Liver Y90 procedure performed in IR today with Dr Fredia Sorrow Pt has had nausea and 1 episode of vomitting Feels better  Objective: Vital signs in last 24 hours: Temp:  [97.7 F (36.5 C)-98.2 F (36.8 C)] 98.1 F (36.7 C) (01/21 1630) Pulse Rate:  [81-111] 93  (01/21 1630) Resp:  [5-22] 20  (01/21 1630) BP: (91-123)/(58-80) 109/72 mmHg (01/21 1630) SpO2:  [95 %-100 %] 100 % (01/21 1630) Weight:  [142 lb 2 oz (64.467 kg)] 142 lb 2 oz (64.467 kg) (01/21 0722)    Intake/Output from previous day:   Intake/Output this shift:    PE: VSS; afeb Rt groin NT; no bleeding; no hematoma Rt foot 2+ pulses Lungs CTA Abd; soft; NT 1 episode vomit approx 3pm; none since  Lab Results:   [facility] 04/30/12 0755  WBC 5.5  HGB 10.6*  HCT 30.7*  PLT 179   BMET  Basename 04/30/12 0755  NA 135  K 3.8  CL 102  CO2 24  GLUCOSE 91  BUN 12  CREATININE 0.72  CALCIUM 8.8   PT/INR  Basename 04/30/12 0755  LABPROT 13.6  INR 1.05   ABG No results found for this basename: PHART:2,PCO2:2,PO2:2,HCO3:2 in the last 72 hours  Studies/Results: Nm Liver Img Spect  04/30/2012  *RADIOLOGY REPORT*  Clinical Data:  Y - 90 microspheres therapy for hepatic metastasis. Metastatic breast cancer to the liver.  Unresectable.  Left hepatic lobe therapy.  NUCLEAR MEDICINE LIVER SCAN SPECT  Technique:  Abdominal images were obtained in multiple projections after intravenous injection of radiopharmaceutical.  Comparison:  PET CT 04/15/2012  Findings: Bremsstrahlung  planar and SPECT imaging of the abdomen following  intrahepatic arterial delivery of Y-25microsphere demonstrates  radioactivity localized to the liver.  No evidence of extrahepatic activity.  IMPRESSION: Bremssstrahlung scan demonstrates activity localized to the liver with no extrahepatic activity identified.   Original Report Authenticated By: Genevive Bi, M.D.    Nm Special Med Rad Physics  Cons  04/30/2012  *RADIOLOGY REPORT*  Clinical Data: Unresectable  tumor metastasis to the liver. Treatment of left hepatic lobe.  Initial radioembolization therapy for metastatic breast cancer.  Exam: NM SPECIAL TREATMENT PROCEDURE,NM SPECIAL MED RAD PHYSICS CONS,NM RADIO PHARM THERAPY INTRAARTERIAL  Technique:  In  conjunction with the interventional radiologist a Y - Microsphere dose was calculated utilizing body surface area formulation.  Calculated dose equal 22.74 mCi.  Pre therapy MAA liver SPECT scan and CTA were evaluated.  Utilizing a microcatheter system, the hepatic artery was selected and  Y-90 microspheres were delivered in fractionated aliquots.  Radiopharmaceutical was delivered by the interventional radiologist and  nuclear radiologist.  The patient tolerated procedure well.  No adverse effects were noted.  Bremsstrahlung scan to follow.  Comparison:21.75  Radiopharmaceutical:21.75 mCi Y -90 microspheres.  Findings: Y - 90 microspheres therapy as above.  IMPRESSION: Successful Y - 90 microsphere delivery for treatment of unresectable liver metastasis. Initial radioembolization of left hepatic lobe.   Original Report Authenticated By: Genevive Bi, M.D.    Nm Special Treatment Procedure  04/30/2012  *RADIOLOGY REPORT*  Clinical Data: Unresectable  tumor metastasis to the liver. Treatment of left hepatic lobe.  Initial radioembolization therapy for metastatic breast cancer.  Exam: NM SPECIAL TREATMENT PROCEDURE,NM SPECIAL MED RAD PHYSICS CONS,NM RADIO PHARM THERAPY INTRAARTERIAL  Technique:  In  conjunction with the interventional radiologist a Y - Microsphere dose was calculated utilizing body surface area formulation.  Calculated dose equal 22.74  mCi.  Pre therapy MAA liver SPECT scan and CTA were evaluated.  Utilizing a microcatheter system, the hepatic artery was selected and  Y-90 microspheres were delivered in fractionated aliquots.  Radiopharmaceutical was delivered by the interventional  radiologist and  nuclear radiologist.  The patient tolerated procedure well.  No adverse effects were noted.  Bremsstrahlung scan to follow.  Comparison:21.75  Radiopharmaceutical:21.75 mCi Y -90 microspheres.  Findings: Y - 90 microspheres therapy as above.  IMPRESSION: Successful Y - 90 microsphere delivery for treatment of unresectable liver metastasis. Initial radioembolization of left hepatic lobe.   Original Report Authenticated By: Genevive Bi, M.D.    Nm Radio Pharm Therapy Intraarterial  04/30/2012  *RADIOLOGY REPORT*  Clinical Data: Unresectable  tumor metastasis to the liver. Treatment of left hepatic lobe.  Initial radioembolization therapy for metastatic breast cancer.  Exam: NM SPECIAL TREATMENT PROCEDURE,NM SPECIAL MED RAD PHYSICS CONS,NM RADIO PHARM THERAPY INTRAARTERIAL  Technique:  In  conjunction with the interventional radiologist a Y - Microsphere dose was calculated utilizing body surface area formulation.  Calculated dose equal 22.74 mCi.  Pre therapy MAA liver SPECT scan and CTA were evaluated.  Utilizing a microcatheter system, the hepatic artery was selected and  Y-90 microspheres were delivered in fractionated aliquots.  Radiopharmaceutical was delivered by the interventional radiologist and  nuclear radiologist.  The patient tolerated procedure well.  No adverse effects were noted.  Bremsstrahlung scan to follow.  Comparison:21.75  Radiopharmaceutical:21.75 mCi Y -90 microspheres.  Findings: Y - 90 microspheres therapy as above.  IMPRESSION: Successful Y - 90 microsphere delivery for treatment of unresectable liver metastasis. Initial radioembolization of left hepatic lobe.   Original Report Authenticated By: Genevive Bi, M.D.     Anti-infectives: Anti-infectives     Start     Dose/Rate Route Frequency Ordered Stop   04/30/12 0708   piperacillin-tazobactam (ZOSYN) IVPB 3.375 g        3.375 g 12.5 mL/hr over 240 Minutes Intravenous  Once 04/30/12 0708 04/30/12 1331            Assessment/Plan: s/p  Y90 procedure in IR today Back pain resolving Nausea resolving Dr Fredia Sorrow to see before dc Rx: Phenergan 25 mg #20 Vicodin 5/325mg  #30 Omeprazole 20 #30   LOS: 0 days    Kathy Jimenez A 04/30/2012

## 2012-05-07 ENCOUNTER — Other Ambulatory Visit: Payer: Self-pay | Admitting: Interventional Radiology

## 2012-05-07 DIAGNOSIS — C787 Secondary malignant neoplasm of liver and intrahepatic bile duct: Secondary | ICD-10-CM

## 2012-05-20 ENCOUNTER — Telehealth: Payer: Self-pay | Admitting: *Deleted

## 2012-05-20 NOTE — Telephone Encounter (Signed)
error 

## 2012-05-23 ENCOUNTER — Other Ambulatory Visit: Payer: Self-pay | Admitting: *Deleted

## 2012-05-24 ENCOUNTER — Other Ambulatory Visit: Payer: Self-pay | Admitting: Lab

## 2012-05-24 ENCOUNTER — Ambulatory Visit: Payer: Self-pay | Admitting: Oncology

## 2012-05-27 ENCOUNTER — Emergency Department (HOSPITAL_COMMUNITY): Payer: BC Managed Care – PPO

## 2012-05-27 ENCOUNTER — Telehealth: Payer: Self-pay | Admitting: *Deleted

## 2012-05-27 ENCOUNTER — Other Ambulatory Visit: Payer: Self-pay

## 2012-05-27 ENCOUNTER — Encounter (HOSPITAL_COMMUNITY): Payer: Self-pay | Admitting: Emergency Medicine

## 2012-05-27 ENCOUNTER — Inpatient Hospital Stay (HOSPITAL_COMMUNITY)
Admission: EM | Admit: 2012-05-27 | Discharge: 2012-05-30 | DRG: 556 | Disposition: A | Discharge status: Home / Self Care | Payer: BC Managed Care – PPO | Attending: General Surgery | Admitting: General Surgery

## 2012-05-27 DIAGNOSIS — K819 Cholecystitis, unspecified: Secondary | ICD-10-CM

## 2012-05-27 DIAGNOSIS — I509 Heart failure, unspecified: Secondary | ICD-10-CM | POA: Diagnosis present

## 2012-05-27 DIAGNOSIS — I5022 Chronic systolic (congestive) heart failure: Secondary | ICD-10-CM | POA: Diagnosis present

## 2012-05-27 DIAGNOSIS — Z79899 Other long term (current) drug therapy: Secondary | ICD-10-CM

## 2012-05-27 DIAGNOSIS — N200 Calculus of kidney: Secondary | ICD-10-CM | POA: Diagnosis present

## 2012-05-27 DIAGNOSIS — C7951 Secondary malignant neoplasm of bone: Secondary | ICD-10-CM | POA: Diagnosis present

## 2012-05-27 DIAGNOSIS — Z882 Allergy status to sulfonamides status: Secondary | ICD-10-CM

## 2012-05-27 DIAGNOSIS — Z923 Personal history of irradiation: Secondary | ICD-10-CM

## 2012-05-27 DIAGNOSIS — K8000 Calculus of gallbladder with acute cholecystitis without obstruction: Principal | ICD-10-CM | POA: Diagnosis present

## 2012-05-27 DIAGNOSIS — Z9221 Personal history of antineoplastic chemotherapy: Secondary | ICD-10-CM

## 2012-05-27 DIAGNOSIS — C7900 Secondary malignant neoplasm of unspecified kidney and renal pelvis: Secondary | ICD-10-CM | POA: Diagnosis present

## 2012-05-27 DIAGNOSIS — R1011 Right upper quadrant pain: Secondary | ICD-10-CM

## 2012-05-27 DIAGNOSIS — I428 Other cardiomyopathies: Secondary | ICD-10-CM | POA: Diagnosis present

## 2012-05-27 DIAGNOSIS — C50919 Malignant neoplasm of unspecified site of unspecified female breast: Secondary | ICD-10-CM | POA: Diagnosis present

## 2012-05-27 DIAGNOSIS — Z853 Personal history of malignant neoplasm of breast: Secondary | ICD-10-CM

## 2012-05-27 DIAGNOSIS — C787 Secondary malignant neoplasm of liver and intrahepatic bile duct: Secondary | ICD-10-CM | POA: Diagnosis present

## 2012-05-27 HISTORY — DX: Malignant neoplasm of unspecified site of unspecified female breast: C50.919

## 2012-05-27 HISTORY — DX: Unspecified urethral stricture, male, unspecified site: N35.919

## 2012-05-27 LAB — CBC WITH DIFFERENTIAL/PLATELET
Basophils Relative: 0 % (ref 0–1)
Eosinophils Absolute: 0.2 10*3/uL (ref 0.0–0.7)
Hemoglobin: 11.2 g/dL — ABNORMAL LOW (ref 12.0–15.0)
MCH: 30.8 pg (ref 26.0–34.0)
MCHC: 33.9 g/dL (ref 30.0–36.0)
Monocytes Relative: 15 % — ABNORMAL HIGH (ref 3–12)
Neutrophils Relative %: 70 % (ref 43–77)
Platelets: 222 10*3/uL (ref 150–400)

## 2012-05-27 LAB — COMPREHENSIVE METABOLIC PANEL
Albumin: 3.2 g/dL — ABNORMAL LOW (ref 3.5–5.2)
Alkaline Phosphatase: 349 U/L — ABNORMAL HIGH (ref 39–117)
BUN: 14 mg/dL (ref 6–23)
Potassium: 3.7 mEq/L (ref 3.5–5.1)
Total Protein: 7.1 g/dL (ref 6.0–8.3)

## 2012-05-27 LAB — LIPASE, BLOOD: Lipase: 35 U/L (ref 11–59)

## 2012-05-27 MED ORDER — LACTATED RINGERS IV BOLUS (SEPSIS)
1000.0000 mL | Freq: Three times a day (TID) | INTRAVENOUS | Status: DC | PRN
  Administered 2012-05-28 (×2): 1000 mL via INTRAVENOUS

## 2012-05-27 MED ORDER — LETROZOLE 2.5 MG PO TABS: 2.5000 mg | ORAL_TABLET | Freq: Every day | ORAL | Status: DC

## 2012-05-27 MED ORDER — HYDROMORPHONE HCL PF 1 MG/ML IJ SOLN: 0.5000 mg | INTRAMUSCULAR | Status: DC | PRN

## 2012-05-27 MED ORDER — SPIRONOLACTONE 25 MG PO TABS
25.0000 mg | ORAL_TABLET | Freq: Every morning | ORAL | Status: DC
  Filled 2012-05-27: qty 1

## 2012-05-27 MED ORDER — KETOROLAC TROMETHAMINE 30 MG/ML IJ SOLN
15.0000 mg | Freq: Four times a day (QID) | INTRAMUSCULAR | Status: DC
  Administered 2012-05-28 – 2012-05-29 (×2): 30 mg via INTRAVENOUS
  Filled 2012-05-27 (×7): qty 1

## 2012-05-27 MED ORDER — METOCLOPRAMIDE HCL 5 MG/ML IJ SOLN: 5.0000 mg | Freq: Four times a day (QID) | INTRAMUSCULAR | Status: DC | PRN

## 2012-05-27 MED ORDER — SODIUM CHLORIDE 0.9 % IV SOLN
3.0000 g | Freq: Four times a day (QID) | INTRAVENOUS | Status: DC
  Administered 2012-05-28 – 2012-05-30 (×10): 3 g via INTRAVENOUS
  Filled 2012-05-27 (×12): qty 3

## 2012-05-27 MED ORDER — PROMETHAZINE HCL 25 MG/ML IJ SOLN
12.5000 mg | Freq: Four times a day (QID) | INTRAMUSCULAR | Status: DC | PRN
  Filled 2012-05-27: qty 1

## 2012-05-27 MED ORDER — LIP MEDEX EX OINT
1.0000 "application " | TOPICAL_OINTMENT | Freq: Two times a day (BID) | CUTANEOUS | Status: DC
  Filled 2012-05-27: qty 7

## 2012-05-27 MED ORDER — ONDANSETRON HCL 4 MG/2ML IJ SOLN
4.0000 mg | Freq: Four times a day (QID) | INTRAMUSCULAR | Status: DC | PRN
  Administered 2012-05-28: 4 mg via INTRAVENOUS
  Filled 2012-05-27: qty 2

## 2012-05-27 MED ORDER — LIDOCAINE-PRILOCAINE 2.5-2.5 % EX CREA
1.0000 "application " | TOPICAL_CREAM | Freq: Every day | CUTANEOUS | Status: DC | PRN
  Filled 2012-05-27: qty 5

## 2012-05-27 MED ORDER — POTASSIUM CITRATE-CITRIC ACID 3300-1002 MG PO PACK
1.0000 | PACK | Freq: Every day | ORAL | Status: DC
  Filled 2012-05-27: qty 1

## 2012-05-27 MED ORDER — IOHEXOL 350 MG/ML SOLN
100.0000 mL | Freq: Once | INTRAVENOUS | Status: AC | PRN
  Administered 2012-05-27: 60 mL via INTRAVENOUS

## 2012-05-27 MED ORDER — ACETAMINOPHEN 325 MG PO TABS: 650.0000 mg | ORAL_TABLET | Freq: Four times a day (QID) | ORAL | Status: DC | PRN

## 2012-05-27 MED ORDER — LACTATED RINGERS IV BOLUS (SEPSIS)
1000.0000 mL | Freq: Once | INTRAVENOUS | Status: AC
  Administered 2012-05-27: 1000 mL via INTRAVENOUS

## 2012-05-27 MED ORDER — DIPHENHYDRAMINE HCL 12.5 MG/5ML PO ELIX: 12.5000 mg | ORAL_SOLUTION | Freq: Four times a day (QID) | ORAL | Status: DC | PRN

## 2012-05-27 MED ORDER — LETROZOLE 2.5 MG PO TABS
2.5000 mg | ORAL_TABLET | Freq: Every evening | ORAL | Status: DC
  Administered 2012-05-28: 2.5 mg via ORAL
  Filled 2012-05-27 (×3): qty 1

## 2012-05-27 MED ORDER — METOPROLOL TARTRATE 12.5 MG HALF TABLET
12.5000 mg | ORAL_TABLET | Freq: Two times a day (BID) | ORAL | Status: DC | PRN
  Filled 2012-05-27: qty 1

## 2012-05-27 MED ORDER — CARVEDILOL 6.25 MG PO TABS
6.2500 mg | ORAL_TABLET | Freq: Two times a day (BID) | ORAL | Status: DC
  Administered 2012-05-28 – 2012-05-30 (×5): 6.25 mg via ORAL
  Filled 2012-05-27 (×7): qty 1

## 2012-05-27 MED ORDER — HEPARIN SODIUM (PORCINE) 5000 UNIT/ML IJ SOLN
5000.0000 [IU] | Freq: Three times a day (TID) | INTRAMUSCULAR | Status: DC
  Administered 2012-05-28 – 2012-05-30 (×5): 5000 [IU] via SUBCUTANEOUS
  Filled 2012-05-27 (×10): qty 1

## 2012-05-27 MED ORDER — DEXTROSE IN LACTATED RINGERS 5 % IV SOLN
INTRAVENOUS | Status: DC
  Administered 2012-05-28 – 2012-05-29 (×2): via INTRAVENOUS

## 2012-05-27 MED ORDER — ALUM & MAG HYDROXIDE-SIMETH 200-200-20 MG/5ML PO SUSP: 30.0000 mL | Freq: Four times a day (QID) | ORAL | Status: DC | PRN

## 2012-05-27 MED ORDER — LISINOPRIL 10 MG PO TABS
10.0000 mg | ORAL_TABLET | Freq: Two times a day (BID) | ORAL | Status: DC
  Administered 2012-05-28: 10 mg via ORAL
  Filled 2012-05-27 (×3): qty 1

## 2012-05-27 MED ORDER — BISACODYL 10 MG RE SUPP: 10.0000 mg | Freq: Two times a day (BID) | RECTAL | Status: DC | PRN

## 2012-05-27 MED ORDER — DIPHENHYDRAMINE HCL 50 MG/ML IJ SOLN: 12.5000 mg | Freq: Four times a day (QID) | INTRAMUSCULAR | Status: DC | PRN

## 2012-05-27 MED ORDER — MAGIC MOUTHWASH
15.0000 mL | Freq: Four times a day (QID) | ORAL | Status: DC | PRN
  Filled 2012-05-27: qty 15

## 2012-05-27 MED ORDER — MAGNESIUM HYDROXIDE 400 MG/5ML PO SUSP: 30.0000 mL | Freq: Two times a day (BID) | ORAL | Status: DC | PRN

## 2012-05-27 MED ORDER — ACETAMINOPHEN 650 MG RE SUPP: 650.0000 mg | Freq: Four times a day (QID) | RECTAL | Status: DC | PRN

## 2012-05-27 NOTE — ED Notes (Addendum)
Pt c/o 10/10 pain upon taking a deep breath. Pt reports singing in the choir yesterday and felt short of breath after.  Pt states that without taking deep breaths there is no pain. Pt reports nausea and vomiting. Pt was referred to ED by Park Breed, from [facility],  due to shortness of breath. Pt reports having several complications since having her Y90 procedure.

## 2012-05-27 NOTE — Telephone Encounter (Signed)
Received call from patient stating she has been having constant pain between her shoulder blades and it takes her breath away when she takes a deep breath.  The pain radiates to under her right rib cage.  SHe also has been having some nausea and vomiting intermittently and lower flank pain.  Instructed patient to go to the emergency room to be evaluated. Patient was reluctant but was able to convince her to go.  This RN called and spoke to charge nurse in ED to inform them of the situation and that patient was on her way.

## 2012-05-27 NOTE — ED Notes (Signed)
Family member came to nurses desk asked for an update. Informed family member I would check on the status of the patient.

## 2012-05-27 NOTE — ED Notes (Signed)
Dr. Michaell Cowing, MD at bedside.

## 2012-05-27 NOTE — H&P (Signed)
Kathy Jimenez  Nov 26, 1970 [id number] on for evaluation:  Patient Care Team: Dolores Patty, MD (Cardiology) Gretta Cool, MD as Consulting Physician (Obstetrics and Gynecology) Currie Paris, MD as Consulting Physician (General Surgery) Victorino December, MD as Consulting Physician (Medical Oncology)   This patient is a 42 y.o.female who presents today for surgical evaluation at the request of Dr. Eber Hong, [facility] long emergency department.   Reason for evaluation: Probable acute cholecystitis.  Pleasant female in the emergency room with her husband.  History of breast cancer.  Had lumpectomy in 2004 by Dr. Jamey Ripa.  Had chemotherapy and radiation.  Developed late metastatic disease diagnosed 2012.  Has been undergoing chemotherapy.  Has had selective embolization of left gastric & hepatic arterial branches.  Had recent Y90 radiation treatment for liver metastases.  Last Y90 treatment 30 April 2012.  Had some soreness in the right upper abdomen.  Obvious liver mass and swelling in abdomen has decreased though.  Patient with a history of right upper quadrant pain.  Rather intense.  Especially this past week.  Began to radiate to the back above the shoulder blades as well.  Struggles with intermittent nausea and vomiting for some time, especially with narcotics and chemotherapy.  Tries to avoid all pain medications as a result. Toradol is helped with other pains in the past.   Denies fevers or chills.  Has been hesitant to try much in the way of eating.  No personal nor family history of GI/colon cancer, inflammatory bowel disease, irritable bowel syndrome, allergy such as Celiac Sprue, dietary/dairy problems, colitis, ulcers nor gastritis.  No recent sick contacts/gastroenteritis.  No travel outside the country.  No changes in diet.  Developed heart failure on chemotherapy in past.  That was held.  Cardiac function is improved.  Followed by Dr. Gala Romney with cardiology.  Last  evaluation reassuring.  Can walk about 10-15 minutes before she has to stop.  Has had worsening shortness of breath.  Pleuritic pain upper back & RUQ pain with deep breaths.  Right upper quadrant pain worse with deep breaths.  Came the emergency room over concerns of a possible pulmonary embolism.  Pain was more over right upper quadrant.  Ultrasound shows thickening with Eulah Pont sign concerning for cholecystitis.  Surgical consultation requested.  Also set some history of kidney stones especially on the right side.  Recently evaluated at [facility]   Interventions being held off right now in the midst of her treatment for metastatic liver disease per  Past Medical History  Diagnosis Date  . Breast cancer 05/2002  . Kidney stones     recurrent since 1989  . Endometrial polyp   . PONV (postoperative nausea and vomiting)   . CHF (congestive heart failure)     from chemo treatment  . Breast cancer metastasized to multiple sites  April2012    Kidney, liver, bones (sternum, L1, left symphysis pubis)  . Urethral stricture     Past Surgical History  Procedure Laterality Date  . Hysteroscopy w/ endometrial ablation  03/11/2009    resection of endometrial polyps, total endometrial resection  . Breast lumpectomy  2004  . Cystoscopy with ureteroscopy  03/24/2011    Procedure: CYSTOSCOPY WITH URETEROSCOPY;  Surgeon: Garnett Farm, MD;  Location: [facility];  Service: Urology;  Laterality: Right;  dilitation of ureteral stricture    History   Social History  . Marital Status: Divorced    Spouse Name: N/A    Number of Children: N/A  .  Years of Education: N/A   Occupational History  . Director of [employer]   Social History Main Topics  . Smoking status: Never Smoker   . Smokeless tobacco: Never Used  . Alcohol Use: 0.6 oz/week    1 Glasses of wine per week     Comment: rare  . Drug Use: No  . Sexually Active: No   Other Topics Concern  . Not on file   Social History Narrative   . No narrative on file    No family history on file.  Current Facility-Administered Medications  Medication Dose Route Frequency Provider Last Rate Last Dose  . acetaminophen (TYLENOL) tablet 650 mg  650 mg Oral Q6H PRN Ardeth Sportsman, MD       Or  . acetaminophen (TYLENOL) suppository 650 mg  650 mg Rectal Q6H PRN Ardeth Sportsman, MD      . alum & mag hydroxide-simeth (MAALOX/MYLANTA) 200-200-20 MG/5ML suspension 30 mL  30 mL Oral Q6H PRN Ardeth Sportsman, MD      . Ampicillin-Sulbactam (UNASYN) 3 g in sodium chloride 0.9 % 100 mL IVPB  3 g Intravenous Q6H Ardeth Sportsman, MD      . bisacodyl (DULCOLAX) suppository 10 mg  10 mg Rectal Q12H PRN Ardeth Sportsman, MD      . Melene Muller ON 05/28/2012] carvedilol (COREG) tablet 6.25 mg  6.25 mg Oral BID WC Ardeth Sportsman, MD      . dextrose 5 % in lactated ringers infusion   Intravenous Continuous Ardeth Sportsman, MD      . diphenhydrAMINE (BENADRYL) injection 12.5-25 mg  12.5-25 mg Intravenous Q6H PRN Ardeth Sportsman, MD       Or  . diphenhydrAMINE (BENADRYL) 12.5 MG/5ML elixir 12.5-25 mg  12.5-25 mg Oral Q6H PRN Ardeth Sportsman, MD      . Melene Muller ON 05/28/2012] heparin injection 5,000 Units  5,000 Units Subcutaneous Q8H Ardeth Sportsman, MD      . HYDROmorphone (DILAUDID) injection 0.5-2 mg  0.5-2 mg Intravenous Q2H PRN Ardeth Sportsman, MD      . Melene Muller ON 05/28/2012] ketorolac (TORADOL) 15 MG/ML injection 15-30 mg  15-30 mg Intravenous Q6H Ardeth Sportsman, MD      . lactated ringers bolus 1,000 mL  1,000 mL Intravenous Once Ardeth Sportsman, MD      . lactated ringers bolus 1,000 mL  1,000 mL Intravenous Q8H PRN Ardeth Sportsman, MD      . Melene Muller ON 05/28/2012] letrozole [facility]) tablet 2.5 mg  2.5 mg Oral QPM Ardeth Sportsman, MD      . Melene Muller ON 05/28/2012] letrozole [facility]) tablet 2.5 mg  2.5 mg Oral Daily Ardeth Sportsman, MD      . lidocaine-prilocaine (EMLA) cream 1 application  1 application Topical Daily PRN Ardeth Sportsman, MD      . lip balm  (CARMEX) ointment 1 application  1 application Topical BID Ardeth Sportsman, MD      . lisinopril (PRINIVIL,ZESTRIL) tablet 10 mg  10 mg Oral BID Ardeth Sportsman, MD      . magic mouthwash  15 mL Oral QID PRN Ardeth Sportsman, MD      . magnesium hydroxide (MILK OF MAGNESIA) suspension 30 mL  30 mL Oral Q12H PRN Ardeth Sportsman, MD      . metoCLOPramide (REGLAN) injection 5-10 mg  5-10 mg Intravenous Q6H PRN Ardeth Sportsman, MD      .  metoprolol tartrate (LOPRESSOR) tablet 12.5 mg  12.5 mg Oral Q12H PRN Ardeth Sportsman, MD      . ondansetron [facility]) injection 4 mg  4 mg Intravenous Q6H PRN Ardeth Sportsman, MD      . Melene Muller ON 05/28/2012] Potassium Citrate-Citric Acid 3300-1002 MG PACK 1 packet  1 packet Oral Daily Ardeth Sportsman, MD      . promethazine (PHENERGAN) injection 12.5-25 mg  12.5-25 mg Intravenous Q6H PRN Ardeth Sportsman, MD      . Melene Muller ON 05/28/2012] spironolactone (ALDACTONE) tablet 25 mg  25 mg Oral q morning - 10a Ardeth Sportsman, MD       Current Outpatient Prescriptions  Medication Sig Dispense Refill  . carvedilol (COREG) 6.25 MG tablet Take 6.25 mg by mouth 2 (two) times daily with a meal.      . letrozole (FEMARA) 2.5 MG tablet Take 2.5 mg by mouth every evening.      . lidocaine-prilocaine (EMLA) cream Apply 1 application topically daily as needed. For port-a-cath.      . lisinopril (PRINIVIL,ZESTRIL) 10 MG tablet Take 10 mg by mouth 2 (two) times daily.      . Potassium Citrate-Citric Acid (CYTRA K CRYSTALS) 3300-1002 MG PACK Take 1 packet by mouth daily. Mix in 6 ounces of water or juice.      Marland Kitchen spironolactone (ALDACTONE) 25 MG tablet Take 25 mg by mouth every morning.       Facility-Administered Medications Ordered in Other Encounters  Medication Dose Route Frequency Provider Last Rate Last Dose  . sodium chloride 0.9 % injection 10 mL  10 mL Intracatheter PRN Pierce Crane, MD   10 mL at 03/22/12 1737     Allergies  Allergen Reactions  . Sulfa Antibiotics Hives     ROS: Constitutional:  No fevers, chills, sweats.  Weight stable Eyes:  No vision changes, No discharge HENT:  No sore throats, nasal drainage Lymph: No neck swelling, No bruising easily Pulmonary:  No cough, productive sputum CV: No orthopnea, PND  Patient walks 15 minutes for about 1/4-1/2 miles without difficulty.  No exertional chest/neck/shoulder/arm pain. GI: No personal nor family history of GI/colon cancer, inflammatory bowel disease, irritable bowel syndrome, allergy such as Celiac Sprue, dietary/dairy problems, colitis, ulcers nor gastritis.  No recent sick contacts/gastroenteritis.  No travel outside the country.  No changes in diet. Renal: No UTIs, No hematuria Genital:  No drainage, bleeding, masses Musculoskeletal: No severe joint pain.  Good ROM major joints Skin:  No sores or lesions.  No rashes Heme/Lymph:  No easy bleeding.  No swollen lymph nodes Neuro: No focal weakness/numbness.  No seizures Psych: No suicidal ideation.  No hallucinations  BP 123/82  Pulse 89  Temp(Src) 98.2 F (36.8 C) (Oral)  SpO2 98%  Physical Exam: General: Pt awake/alert/oriented x4 in no major acute distress Eyes: PERRL, normal EOM. Sclera nonicteric Neuro: CN II-XII intact w/o focal sensory/motor deficits. Lymph: No head/neck/groin lymphadenopathy Psych:  No delerium/psychosis/paranoia.  Smiling/perky HENT: Normocephalic, Mucus membranes moist.  No thrush Neck: Supple, No tracheal deviation Chest: No pain.  Good respiratory excursion. CV:  Pulses intact.  Regular rhythm Abdomen: Soft, Nondistended.  +TTP RUQ, esp laterally.  + Murphy's sign.  No incarcerated hernias.  Nontender rest of abdomen Ext:  SCDs BLE.  No significant edema.  No cyanosis Skin: No petechiae / purpurea.  No major sores Musculoskeletal: No severe joint pain.  Good ROM major joints   Results:   Labs: Results for  orders placed during the hospital encounter of 05/27/12 (from the past 48 hour(s))  CBC WITH  DIFFERENTIAL     Status: Abnormal   Collection Time    05/27/12  6:30 PM      Result Value Range   WBC 6.8  4.0 - 10.5 K/uL   RBC 3.64 (*) 3.87 - 5.11 MIL/uL   Hemoglobin 11.2 (*) 12.0 - 15.0 g/dL   HCT 45.4 (*) 09.8 - 11.9 %   MCV 90.7  78.0 - 100.0 fL   MCH 30.8  26.0 - 34.0 pg   MCHC 33.9  30.0 - 36.0 g/dL   RDW 14.7  82.9 - 56.2 %   Platelets 222  150 - 400 K/uL   Neutrophils Relative 70  43 - 77 %   Neutro Abs 4.8  1.7 - 7.7 K/uL   Lymphocytes Relative 13  12 - 46 %   Lymphs Abs 0.9  0.7 - 4.0 K/uL   Monocytes Relative 15 (*) 3 - 12 %   Monocytes Absolute 1.0  0.1 - 1.0 K/uL   Eosinophils Relative 2  0 - 5 %   Eosinophils Absolute 0.2  0.0 - 0.7 K/uL   Basophils Relative 0  0 - 1 %   Basophils Absolute 0.0  0.0 - 0.1 K/uL  COMPREHENSIVE METABOLIC PANEL     Status: Abnormal   Collection Time    05/27/12  6:30 PM      Result Value Range   Sodium 135  135 - 145 mEq/L   Potassium 3.7  3.5 - 5.1 mEq/L   Chloride 98  96 - 112 mEq/L   CO2 23  19 - 32 mEq/L   Glucose, Bld 81  70 - 99 mg/dL   BUN 14  6 - 23 mg/dL   Creatinine, Ser 1.30  0.50 - 1.10 mg/dL   Calcium 9.0  8.4 - 86.5 mg/dL   Total Protein 7.1  6.0 - 8.3 g/dL   Albumin 3.2 (*) 3.5 - 5.2 g/dL   AST 784 (*) 0 - 37 U/L   ALT 68 (*) 0 - 35 U/L   Alkaline Phosphatase 349 (*) 39 - 117 U/L   Total Bilirubin 0.5  0.3 - 1.2 mg/dL   GFR calc non Af Amer 86 (*) >90 mL/min   GFR calc Af Amer >90  >90 mL/min   Comment:            The eGFR has been calculated     using the CKD EPI equation.     This calculation has not been     validated in all clinical     situations.     eGFR's persistently     <90 mL/min signify     possible Chronic Kidney Disease.  LIPASE, BLOOD     Status: None   Collection Time    05/27/12  6:30 PM      Result Value Range   Lipase 35  11 - 59 U/L    Imaging / Studies: Ct Angio Chest Pe W/cm &/or Wo Cm  05/27/2012  *RADIOLOGY REPORT*  Clinical Data: Right chest and back pain.  Metastatic  breast carcinoma. Liver radioembolization 04/30/2012.  CT ANGIOGRAPHY CHEST  Technique:  Multidetector CT imaging of the chest using the standard protocol during bolus administration of intravenous contrast. Multiplanar reconstructed images including MIPs were obtained and reviewed to evaluate the vascular anatomy.  Contrast: 60mL OMNIPAQUE IOHEXOL 350 MG/ML SOLN  Comparison: PET CT 04/15/2012  Findings:  There is good contrast opacification of the pulmonary artery branches.  No discrete filling defect to suggest acute PE.Adequate contrast opacification of the thoracic aorta with no evidence of dissection, aneurysm, or stenosis. There is classic 3- vessel brachiocephalic arch anatomy.  No pleural or pericardial effusion.  Right IJ port catheter extends to the proximal right atrium.  No hilar or mediastinal adenopathy.  Innumerable low attenuation liver lesions, much more extensive and apparent than on prior PET CT, possibly related to interval radioembolization versus significant progression of hepatic metastatic disease. Embolization coils noted in the upper abdomen.  Lungs are clear. Minimal dependent atelectasis posteriorly in the lower lobes. 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  Thoracic spine intact.  IMPRESSION: 1.  Negative for acute PE or thoracic aortic dissection. 2. Patchy nodular liver parenchymal changes may represent sequelae of recent radioembolization versus progression of hepatic metastatic disease.   Original Report Authenticated By: D. Andria Rhein, MD    Nm Liver Img Spect  04/30/2012  *RADIOLOGY REPORT*  Clinical Data:  Y - 90 microspheres therapy for hepatic metastasis. Metastatic breast cancer to the liver.  Unresectable.  Left hepatic lobe therapy.  NUCLEAR MEDICINE LIVER SCAN SPECT  Technique:  Abdominal images were obtained in multiple projections after intravenous injection of radiopharmaceutical.  Comparison:  PET CT 04/15/2012  Findings: Bremsstrahlung  planar and SPECT  imaging of the abdomen following  intrahepatic arterial delivery of Y-40microsphere demonstrates  radioactivity localized to the liver.  No evidence of extrahepatic activity.  IMPRESSION: Bremssstrahlung scan demonstrates activity localized to the liver with no extrahepatic activity identified.   Original Report Authenticated By: Genevive Bi, M.D.    US Abdomen Complete  05/27/2012  *RADIOLOGY REPORT*  Clinical Data:  Nausea and right upper quadrant abdominal pain. Metastatic breast cancer.  COMPLETE ABDOMINAL ULTRASOUND  Comparison:  Chest CTA 05/27/2012.  PET CT 04/15/2012.  Findings:  Gallbladder: Contains multiple small shadowing gallstones.  There is gallbladder wall thickening to 3.7 mm.  Sonographer reports a positive sonographic Murphy's sign.  Common bile duct:   Not well visualized, although without evidence of significant dilatation.  Liver:  The liver is diffusely replaced by widespread metastatic disease as demonstrated on the earlier CT.  There are probable nodal metastases within the porta caval as well.  IVC:  Visualized portions appear unremarkable.  Pancreas:  Visualized portions appear unremarkable.  Spleen:  Visualized portions appear unremarkable.  Right Kidney:   The renal cortical thickness and echogenicity are preserved.  There is no hydronephrosis or focal abnormality. Renal length is 10.7 cm.  Left Kidney:   The renal cortical thickness and echogenicity are preserved.  There is no hydronephrosis or focal abnormality. Renal length is 10.2 cm.  Abdominal aorta:  Visualized portions appear unremarkable.  IMPRESSION:  1.  Widespread hepatic metastatic disease. 2.  Gallstones and gallbladder wall thickening with positive sonographic Murphy's sign.  Findings suspicious for cholecystitis. No significant biliary dilatation identified.   Original Report Authenticated By: Carey Bullocks, M.D.    Ir Angiogram Visceral Selective  04/30/2012  *RADIOLOGY REPORT* Clinical Data: Metastatic breast  carcinoma to the liver.  The patient presents for Yttrium-90 radioembolization of the left lobe of the liver and is status post arteriography with left gastric arterial branch embolization on 04/18/2012.  1. ULTRASOUND GUIDANCE FOR VASCULAR ACCESS OF THE RIGHT COMMON FEMORAL ARTERY  2. HEPATIC ARTERIOGRAPHY WITH SELECTIVE CATHETERIZATION OF THE CELIAC AXIS AND ARTERIOGRAPHY AT THE LEVEL OF THE CELIAC AXIS  3. TRANSCATHETER YTTRIUM-90 RADIOEMBOLIZATION OF THE  LEFT HEPATIC ARTERY  4. FOLLOW-UP ANGIOGRAPHY AFTER LEFT HEPATIC RADIOEMBOLIZATION  Sedation: 6.0 mg IV Versed; 300 mcg IV Fentanyl.  Total Moderate Sedation Time: 105 minutes.  Contrast: 80 ml Omnipaque-300  Additional Medications: 3.375 grams IV Zosyn, 20 mg IV Decadron, 40 mg IV Protonix, 4 mg IV Zofran  Y-90 dose: 25 mCi  Fluoroscopy Time: 17.2 minutes.  Procedure: The procedure, risks, benefits, and alternatives were explained to the patient. Questions regarding the procedure were encouraged and answered. The patient understands and consents to the procedure.  The right groin was prepped with Betadine in a sterile fashion, and a sterile drape was applied covering the operative field. A sterile gown and sterile gloves were used for the procedure. Local anesthesia was provided with 1% Lidocaine. Ultrasound image documentation was performed.  Access of the right common femoral artery was performed under ultrasound guidance with a micropuncture set. A 5-French sheath was placed. A 5-French Cobra catheter was advanced and used to selectively catheterize the celiac axis.  Selective celiac arteriography was performed.  A coaxial microcatheter was further advanced into the left gastric artery.  Selective arteriography was performed.  The microcatheter was further advanced into a replaced left hepatic artery and arteriography performed.  Radioembolization was performed with Yttrium-90 SIR Spheres. Particles were administered via a microcatheter utilizing a  completely enclosed system. Monitoring of antegrade flow was performed during administration under fluoroscopy with use of contrast intermittently.  Follow-up angiography was performed after dose administration. The microcatheter, outer catheter and administration system were then discarded.  Femoral puncture site was assessed with oblique arteriography. The sheath was reprepped with Betadine.  Arteriotomy closure was performed with the Cordis ExoSeal device.  Complications: None  Findings:  Initial arteriography shows durable occlusion of the three separate left gastric artery trunks previously embolized transcatheter coils.  The replaced left hepatic artery remains widely patent.  With the microcatheter positioned in the left hepatic artery, the entire dose of prescribed Yttrium-90 SIR Spheres was able to be administered.  Antegrade flow was maintained in the left hepatic artery during the entire administration.  Arteriotomy closure was initially successful in establishing hemostasis. The patient will recover for 6 hours after the procedure.  IMPRESSION:  Left hepatic arterial radioembolization performed with Yttrium-90 microspheres.   Initial clinical follow-up will be performed in 4 weeks.   Original Report Authenticated By: Irish Lack, M.D.    Ir Angiogram Selective Each Additional Vessel  04/30/2012  *RADIOLOGY REPORT* Clinical Data: Metastatic breast carcinoma to the liver.  The patient presents for Yttrium-90 radioembolization of the left lobe of the liver and is status post arteriography with left gastric arterial branch embolization on 04/18/2012.  1. ULTRASOUND GUIDANCE FOR VASCULAR ACCESS OF THE RIGHT COMMON FEMORAL ARTERY  2. HEPATIC ARTERIOGRAPHY WITH SELECTIVE CATHETERIZATION OF THE CELIAC AXIS AND ARTERIOGRAPHY AT THE LEVEL OF THE CELIAC AXIS  3. TRANSCATHETER YTTRIUM-90 RADIOEMBOLIZATION OF THE LEFT HEPATIC ARTERY  4. FOLLOW-UP ANGIOGRAPHY AFTER LEFT HEPATIC RADIOEMBOLIZATION  Sedation: 6.0  mg IV Versed; 300 mcg IV Fentanyl.  Total Moderate Sedation Time: 105 minutes.  Contrast: 80 ml Omnipaque-300  Additional Medications: 3.375 grams IV Zosyn, 20 mg IV Decadron, 40 mg IV Protonix, 4 mg IV Zofran  Y-90 dose: 25 mCi  Fluoroscopy Time: 17.2 minutes.  Procedure: The procedure, risks, benefits, and alternatives were explained to the patient. Questions regarding the procedure were encouraged and answered. The patient understands and consents to the procedure.  The right groin was prepped with Betadine in a sterile fashion,  and a sterile drape was applied covering the operative field. A sterile gown and sterile gloves were used for the procedure. Local anesthesia was provided with 1% Lidocaine. Ultrasound image documentation was performed.  Access of the right common femoral artery was performed under ultrasound guidance with a micropuncture set. A 5-French sheath was placed. A 5-French Cobra catheter was advanced and used to selectively catheterize the celiac axis.  Selective celiac arteriography was performed.  A coaxial microcatheter was further advanced into the left gastric artery.  Selective arteriography was performed.  The microcatheter was further advanced into a replaced left hepatic artery and arteriography performed.  Radioembolization was performed with Yttrium-90 SIR Spheres. Particles were administered via a microcatheter utilizing a completely enclosed system. Monitoring of antegrade flow was performed during administration under fluoroscopy with use of contrast intermittently.  Follow-up angiography was performed after dose administration. The microcatheter, outer catheter and administration system were then discarded.  Femoral puncture site was assessed with oblique arteriography. The sheath was reprepped with Betadine.  Arteriotomy closure was performed with the Cordis ExoSeal device.  Complications: None  Findings:  Initial arteriography shows durable occlusion of the three separate left  gastric artery trunks previously embolized transcatheter coils.  The replaced left hepatic artery remains widely patent.  With the microcatheter positioned in the left hepatic artery, the entire dose of prescribed Yttrium-90 SIR Spheres was able to be administered.  Antegrade flow was maintained in the left hepatic artery during the entire administration.  Arteriotomy closure was initially successful in establishing hemostasis. The patient will recover for 6 hours after the procedure.  IMPRESSION:  Left hepatic arterial radioembolization performed with Yttrium-90 microspheres.   Initial clinical follow-up will be performed in 4 weeks.   Original Report Authenticated By: Irish Lack, M.D.    Nm Special Med Rad Physics Cons  04/30/2012  *RADIOLOGY REPORT*  Clinical Data: Unresectable  tumor metastasis to the liver. Treatment of left hepatic lobe.  Initial radioembolization therapy for metastatic breast cancer.  Exam: NM SPECIAL TREATMENT PROCEDURE,NM SPECIAL MED RAD PHYSICS CONS,NM RADIO PHARM THERAPY INTRAARTERIAL  Technique:  In  conjunction with the interventional radiologist a Y - Microsphere dose was calculated utilizing body surface area formulation.  Calculated dose equal 22.74 mCi.  Pre therapy MAA liver SPECT scan and CTA were evaluated.  Utilizing a microcatheter system, the hepatic artery was selected and  Y-90 microspheres were delivered in fractionated aliquots.  Radiopharmaceutical was delivered by the interventional radiologist and  nuclear radiologist.  The patient tolerated procedure well.  No adverse effects were noted.  Bremsstrahlung scan to follow.  Comparison:21.75  Radiopharmaceutical:21.75 mCi Y -90 microspheres.  Findings: Y - 90 microspheres therapy as above.  IMPRESSION: Successful Y - 90 microsphere delivery for treatment of unresectable liver metastasis. Initial radioembolization of left hepatic lobe.   Original Report Authenticated By: Genevive Bi, M.D.    Nm Special Treatment  Procedure  04/30/2012  *RADIOLOGY REPORT*  Clinical Data: Unresectable  tumor metastasis to the liver. Treatment of left hepatic lobe.  Initial radioembolization therapy for metastatic breast cancer.  Exam: NM SPECIAL TREATMENT PROCEDURE,NM SPECIAL MED RAD PHYSICS CONS,NM RADIO PHARM THERAPY INTRAARTERIAL  Technique:  In  conjunction with the interventional radiologist a Y - Microsphere dose was calculated utilizing body surface area formulation.  Calculated dose equal 22.74 mCi.  Pre therapy MAA liver SPECT scan and CTA were evaluated.  Utilizing a microcatheter system, the hepatic artery was selected and  Y-90 microspheres were delivered in fractionated aliquots.  Radiopharmaceutical  was delivered by the interventional radiologist and  nuclear radiologist.  The patient tolerated procedure well.  No adverse effects were noted.  Bremsstrahlung scan to follow.  Comparison:21.75  Radiopharmaceutical:21.75 mCi Y -90 microspheres.  Findings: Y - 90 microspheres therapy as above.  IMPRESSION: Successful Y - 90 microsphere delivery for treatment of unresectable liver metastasis. Initial radioembolization of left hepatic lobe.   Original Report Authenticated By: Genevive Bi, M.D.    Ir US Guide Vasc Access Right  04/30/2012  *RADIOLOGY REPORT* Clinical Data: Metastatic breast carcinoma to the liver.  The patient presents for Yttrium-90 radioembolization of the left lobe of the liver and is status post arteriography with left gastric arterial branch embolization on 04/18/2012.  1. ULTRASOUND GUIDANCE FOR VASCULAR ACCESS OF THE RIGHT COMMON FEMORAL ARTERY  2. HEPATIC ARTERIOGRAPHY WITH SELECTIVE CATHETERIZATION OF THE CELIAC AXIS AND ARTERIOGRAPHY AT THE LEVEL OF THE CELIAC AXIS  3. TRANSCATHETER YTTRIUM-90 RADIOEMBOLIZATION OF THE LEFT HEPATIC ARTERY  4. FOLLOW-UP ANGIOGRAPHY AFTER LEFT HEPATIC RADIOEMBOLIZATION  Sedation: 6.0 mg IV Versed; 300 mcg IV Fentanyl.  Total Moderate Sedation Time: 105 minutes.  Contrast:  80 ml Omnipaque-300  Additional Medications: 3.375 grams IV Zosyn, 20 mg IV Decadron, 40 mg IV Protonix, 4 mg IV Zofran  Y-90 dose: 25 mCi  Fluoroscopy Time: 17.2 minutes.  Procedure: The procedure, risks, benefits, and alternatives were explained to the patient. Questions regarding the procedure were encouraged and answered. The patient understands and consents to the procedure.  The right groin was prepped with Betadine in a sterile fashion, and a sterile drape was applied covering the operative field. A sterile gown and sterile gloves were used for the procedure. Local anesthesia was provided with 1% Lidocaine. Ultrasound image documentation was performed.  Access of the right common femoral artery was performed under ultrasound guidance with a micropuncture set. A 5-French sheath was placed. A 5-French Cobra catheter was advanced and used to selectively catheterize the celiac axis.  Selective celiac arteriography was performed.  A coaxial microcatheter was further advanced into the left gastric artery.  Selective arteriography was performed.  The microcatheter was further advanced into a replaced left hepatic artery and arteriography performed.  Radioembolization was performed with Yttrium-90 SIR Spheres. Particles were administered via a microcatheter utilizing a completely enclosed system. Monitoring of antegrade flow was performed during administration under fluoroscopy with use of contrast intermittently.  Follow-up angiography was performed after dose administration. The microcatheter, outer catheter and administration system were then discarded.  Femoral puncture site was assessed with oblique arteriography. The sheath was reprepped with Betadine.  Arteriotomy closure was performed with the Cordis ExoSeal device.  Complications: None  Findings:  Initial arteriography shows durable occlusion of the three separate left gastric artery trunks previously embolized transcatheter coils.  The replaced left hepatic  artery remains widely patent.  With the microcatheter positioned in the left hepatic artery, the entire dose of prescribed Yttrium-90 SIR Spheres was able to be administered.  Antegrade flow was maintained in the left hepatic artery during the entire administration.  Arteriotomy closure was initially successful in establishing hemostasis. The patient will recover for 6 hours after the procedure.  IMPRESSION:  Left hepatic arterial radioembolization performed with Yttrium-90 microspheres.   Initial clinical follow-up will be performed in 4 weeks.   Original Report Authenticated By: Irish Lack, M.D.    Ir Embo Tumor Organ Ischemia Infarct Inc Guide Roadmapping  04/30/2012  *RADIOLOGY REPORT* Clinical Data: Metastatic breast carcinoma to the liver.  The patient presents for  Yttrium-90 radioembolization of the left lobe of the liver and is status post arteriography with left gastric arterial branch embolization on 04/18/2012.  1. ULTRASOUND GUIDANCE FOR VASCULAR ACCESS OF THE RIGHT COMMON FEMORAL ARTERY  2. HEPATIC ARTERIOGRAPHY WITH SELECTIVE CATHETERIZATION OF THE CELIAC AXIS AND ARTERIOGRAPHY AT THE LEVEL OF THE CELIAC AXIS  3. TRANSCATHETER YTTRIUM-90 RADIOEMBOLIZATION OF THE LEFT HEPATIC ARTERY  4. FOLLOW-UP ANGIOGRAPHY AFTER LEFT HEPATIC RADIOEMBOLIZATION  Sedation: 6.0 mg IV Versed; 300 mcg IV Fentanyl.  Total Moderate Sedation Time: 105 minutes.  Contrast: 80 ml Omnipaque-300  Additional Medications: 3.375 grams IV Zosyn, 20 mg IV Decadron, 40 mg IV Protonix, 4 mg IV Zofran  Y-90 dose: 25 mCi  Fluoroscopy Time: 17.2 minutes.  Procedure: The procedure, risks, benefits, and alternatives were explained to the patient. Questions regarding the procedure were encouraged and answered. The patient understands and consents to the procedure.  The right groin was prepped with Betadine in a sterile fashion, and a sterile drape was applied covering the operative field. A sterile gown and sterile gloves were used for  the procedure. Local anesthesia was provided with 1% Lidocaine. Ultrasound image documentation was performed.  Access of the right common femoral artery was performed under ultrasound guidance with a micropuncture set. A 5-French sheath was placed. A 5-French Cobra catheter was advanced and used to selectively catheterize the celiac axis.  Selective celiac arteriography was performed.  A coaxial microcatheter was further advanced into the left gastric artery.  Selective arteriography was performed.  The microcatheter was further advanced into a replaced left hepatic artery and arteriography performed.  Radioembolization was performed with Yttrium-90 SIR Spheres. Particles were administered via a microcatheter utilizing a completely enclosed system. Monitoring of antegrade flow was performed during administration under fluoroscopy with use of contrast intermittently.  Follow-up angiography was performed after dose administration. The microcatheter, outer catheter and administration system were then discarded.  Femoral puncture site was assessed with oblique arteriography. The sheath was reprepped with Betadine.  Arteriotomy closure was performed with the Cordis ExoSeal device.  Complications: None  Findings:  Initial arteriography shows durable occlusion of the three separate left gastric artery trunks previously embolized transcatheter coils.  The replaced left hepatic artery remains widely patent.  With the microcatheter positioned in the left hepatic artery, the entire dose of prescribed Yttrium-90 SIR Spheres was able to be administered.  Antegrade flow was maintained in the left hepatic artery during the entire administration.  Arteriotomy closure was initially successful in establishing hemostasis. The patient will recover for 6 hours after the procedure.  IMPRESSION:  Left hepatic arterial radioembolization performed with Yttrium-90 microspheres.   Initial clinical follow-up will be performed in 4 weeks.    Original Report Authenticated By: Irish Lack, M.D.    Nm Radio Pharm Therapy Intraarterial  04/30/2012  *RADIOLOGY REPORT*  Clinical Data: Unresectable  tumor metastasis to the liver. Treatment of left hepatic lobe.  Initial radioembolization therapy for metastatic breast cancer.  Exam: NM SPECIAL TREATMENT PROCEDURE,NM SPECIAL MED RAD PHYSICS CONS,NM RADIO PHARM THERAPY INTRAARTERIAL  Technique:  In  conjunction with the interventional radiologist a Y - Microsphere dose was calculated utilizing body surface area formulation.  Calculated dose equal 22.74 mCi.  Pre therapy MAA liver SPECT scan and CTA were evaluated.  Utilizing a microcatheter system, the hepatic artery was selected and  Y-90 microspheres were delivered in fractionated aliquots.  Radiopharmaceutical was delivered by the interventional radiologist and  nuclear radiologist.  The patient tolerated procedure well.  No adverse  effects were noted.  Bremsstrahlung scan to follow.  Comparison:21.75  Radiopharmaceutical:21.75 mCi Y -90 microspheres.  Findings: Y - 90 microspheres therapy as above.  IMPRESSION: Successful Y - 90 microsphere delivery for treatment of unresectable liver metastasis. Initial radioembolization of left hepatic lobe.   Original Report Authenticated By: Genevive Bi, M.D.     Medications / Allergies: per chart  Antibiotics: Anti-infectives   Start     Dose/Rate Route Frequency Ordered Stop   05/27/12 2330  Ampicillin-Sulbactam (UNASYN) 3 g in sodium chloride 0.9 % 100 mL IVPB     3 g 100 mL/hr over 60 Minutes Intravenous Every 6 hours 05/27/12 2327        Assessment  Mack Hook  42 y.o. female       Problem List:  Principal Problem:   Acute calculous cholecystitis Active Problems:   Chronic systolic heart failure   Kidney stones   Breast cancer metastasized to multiple sites   Right upper quadrant pain with nausea and vomiting in the setting of a patient with metastatic disease to liver and  recent chemotherapy/Y. 90 treatment.  Ultrasound and physical exam findings concerning for cholecystitis.  Plan:  -admit -IV ABX -cardiac clearance - Hopefully more normal ejection fraction a few months ago reassuring for a low risk for cardiac complications perioperatively.  -probable need for cholecystectomy:  The anatomy & physiology of hepatobiliary & pancreatic function was discussed.  The pathophysiology of gallbladder dysfunction was discussed.  Natural history risks without surgery was discussed.   I feel the risks of no intervention will lead to serious problems that outweigh the operative risks; therefore, I recommended cholecystectomy to remove the pathology.  I explained laparoscopic techniques with possible need for an open approach.  Probable cholangiogram to evaluate the bilary tract was explained as well.    Risks such as bleeding, infection, abscess, leak, injury to other organs, need for further treatment, heart attack, death, and other risks were discussed.  I noted a good likelihood this will help address the problem.  Possibility that this will not correct all abdominal symptoms was explained.  Goals of post-operative recovery were discussed as well.  We will work to minimize complications.  An educational handout further explaining the pathology and treatment options was given as well.  Questions were answered.  The patient expresses understanding & wishes to consider with surgery.  Initially patient was hoping to go home.  Then she was hoping her cardiologist would clear her in the morning.  She is initially hesitant to accept the idea of surgery.  I cautioned her that cholecystitis is a potentially life-threatening issue and she puts her life at risk by discharging herself home.  Standard of care would be surgery.  However, with her metastatic liver disease, that requires [place] to reevaluate where to go from here.  Ultimately, I suspect cholecystectomy will need to  happen.    -VTE prophylaxis- SCDs, etc -mobilize as tolerated to help recovery    Ardeth Sportsman, M.D., F.A.C.S. Gastrointestinal and Minimally Invasive Surgery [facility] [address] [place], [place] [address] [phone] Main / Paging [phone] Voice Mail   05/27/2012  CARE TEAM:  PCP: No primary provider on file.  Outpatient Care Team: Patient Care Team: Dolores Patty, MD (Cardiology) Gretta Cool, MD as Consulting Physician (Obstetrics and Gynecology) Currie Paris, MD as Consulting Physician (General Surgery) Victorino December, MD as Consulting Physician (Medical Oncology)  Inpatient Treatment Team:  Treatment Team: Attending Provider: Vida Roller, MD; Technician: Shade Flood, NT; Consulting Physician: Bishop Limbo, MD; Registered Nurse: Vilma Meckel, RN

## 2012-05-27 NOTE — ED Notes (Signed)
In to speak with patient regarding patient wait; informed patient we were waiting for ultrasound tech to arrive; Kathy Jimenez to call ultrasound tech to see how much longer;

## 2012-05-27 NOTE — ED Notes (Signed)
Ultrasound tech arrived for ultrasound after undersigned nurse left patient room

## 2012-05-27 NOTE — ED Notes (Signed)
Back in to speak with patient; informed we were waiting on ultrasound report

## 2012-05-27 NOTE — ED Provider Notes (Signed)
History     CSN: [id number]  Arrival date & time 05/27/12  1744   First MD Initiated Contact with Patient 05/27/12 1801      Chief Complaint  Patient presents with  . Shortness of Breath  . Chest Pain    Hurts to breath    (Consider location/radiation/quality/duration/timing/severity/associated sxs/prior treatment) HPI Comments: 42 year old female with a history of metastatic breast cancer with spread to the liver who presents with a complaint of right-sided back pain and right upper quadrant and epigastric abdominal pain. She states that she has had history of congestive heart failure secondary to the chemotherapeutic medications that she took for her breast cancer, she has recently had radioactive seeds implanted in her liver for her metastatic disease and has an element of subacute to chronic abdominal pain similar to what she has today in her epigastric and right upper quadrant. She has frequent episodes of nausea and vomiting and abdominal pain sometimes with eating and sometimes not related to eating. Today she developed a pain in her right upper back radiating to her shoulder blade, worse with deep breathing, associated with shortness of breath. The symptoms are persistent, not associated with swelling of the lower extremities, trauma, travel. She was sent to the office by her call just to evaluate for source of her symptoms including pulmonary embolism. The patient declines pain or nausea medications  The history is provided by the patient and medical records.    Past Medical History  Diagnosis Date  . Breast cancer 05/2002  . Kidney stones     recurrent since 1989  . Endometrial polyp   . PONV (postoperative nausea and vomiting)   . CHF (congestive heart failure)     from chemo treatment  . Breast cancer metastasized to multiple sites  April2012    Kidney, liver, bones (sternum, L1, left symphysis pubis)  . Urethral stricture     Past Surgical History  Procedure  Laterality Date  . Hysteroscopy w/ endometrial ablation  03/11/2009    resection of endometrial polyps, total endometrial resection  . Breast lumpectomy  2004  . Cystoscopy with ureteroscopy  03/24/2011    Procedure: CYSTOSCOPY WITH URETEROSCOPY;  Surgeon: Garnett Farm, MD;  Location: [facility];  Service: Urology;  Laterality: Right;  dilitation of ureteral stricture    No family history on file.  History  Substance Use Topics  . Smoking status: Never Smoker   . Smokeless tobacco: Never Used  . Alcohol Use: 0.6 oz/week    1 Glasses of wine per week     Comment: rare    OB History   Grav Para Term Preterm Abortions TAB SAB Ect Mult Living                  Review of Systems  All other systems reviewed and are negative.    Allergies  Sulfa antibiotics  Home Medications   Current Outpatient Rx  Name  Route  Sig  Dispense  Refill  . carvedilol (COREG) 6.25 MG tablet   Oral   Take 6.25 mg by mouth 2 (two) times daily with a meal.         . letrozole (FEMARA) 2.5 MG tablet   Oral   Take 2.5 mg by mouth every evening.         . lidocaine-prilocaine (EMLA) cream   Topical   Apply 1 application topically daily as needed. For port-a-cath.         . lisinopril (PRINIVIL,ZESTRIL) 10  MG tablet   Oral   Take 10 mg by mouth 2 (two) times daily.         . Potassium Citrate-Citric Acid (CYTRA K CRYSTALS) 3300-1002 MG PACK   Oral   Take 1 packet by mouth daily. Mix in 6 ounces of water or juice.         Marland Kitchen spironolactone (ALDACTONE) 25 MG tablet   Oral   Take 25 mg by mouth every morning.           BP 123/82  Pulse 89  Temp(Src) 98.2 F (36.8 C) (Oral)  SpO2 98%  Physical Exam  Nursing note and vitals reviewed. Constitutional: She appears well-developed and well-nourished. No distress.  HENT:  Head: Normocephalic and atraumatic.  Mouth/Throat: Oropharynx is clear and moist. No oropharyngeal exudate.  Eyes: Conjunctivae and EOM are normal. Pupils are  equal, round, and reactive to light. Right eye exhibits no discharge. Left eye exhibits no discharge. No scleral icterus.  Neck: Normal range of motion. Neck supple. No JVD present. No thyromegaly present.  Cardiovascular: Normal rate, regular rhythm, normal heart sounds and intact distal pulses.  Exam reveals no gallop and no friction rub.   No murmur heard. Pulmonary/Chest: Effort normal and breath sounds normal. No respiratory distress. She has no wheezes. She has no rales. She exhibits no tenderness.  Breath sounds are normal but patient has reproducible pain when she takes a big breath.  Abdominal: Soft. Bowel sounds are normal. She exhibits no distension and no mass. There is tenderness ( Epigastric tenderness, mild right upper quadrant tenderness, no peritoneal signs, no lower abdominal tenderness, no pain at McBurney's point, no peritoneal signs).  Musculoskeletal: Normal range of motion. She exhibits no edema and no tenderness.  Lymphadenopathy:    She has no cervical adenopathy.  Neurological: She is alert. Coordination normal.  Skin: Skin is warm and dry. No rash noted. No erythema.  Psychiatric: She has a normal mood and affect. Her behavior is normal.    ED Course  Procedures (including critical care time)  Labs Reviewed  CBC WITH DIFFERENTIAL - Abnormal; Notable for the following:    RBC 3.64 (*)    Hemoglobin 11.2 (*)    HCT 33.0 (*)    Monocytes Relative 15 (*)    All other components within normal limits  COMPREHENSIVE METABOLIC PANEL - Abnormal; Notable for the following:    Albumin 3.2 (*)    AST 114 (*)    ALT 68 (*)    Alkaline Phosphatase 349 (*)    GFR calc non Af Amer 86 (*)    All other components within normal limits  LIPASE, BLOOD   Ct Angio Chest Pe W/cm &/or Wo Cm  05/27/2012  *RADIOLOGY REPORT*  Clinical Data: Right chest and back pain.  Metastatic breast carcinoma. Liver radioembolization 04/30/2012.  CT ANGIOGRAPHY CHEST  Technique:  Multidetector CT  imaging of the chest using the standard protocol during bolus administration of intravenous contrast. Multiplanar reconstructed images including MIPs were obtained and reviewed to evaluate the vascular anatomy.  Contrast: 60mL OMNIPAQUE IOHEXOL 350 MG/ML SOLN  Comparison: PET CT 04/15/2012  Findings: There is good contrast opacification of the pulmonary artery branches.  No discrete filling defect to suggest acute PE.Adequate contrast opacification of the thoracic aorta with no evidence of dissection, aneurysm, or stenosis. There is classic 3- vessel brachiocephalic arch anatomy.  No pleural or pericardial effusion.  Right IJ port catheter extends to the proximal right atrium.  No hilar  or mediastinal adenopathy.  Innumerable low attenuation liver lesions, much more extensive and apparent than on prior PET CT, possibly related to interval radioembolization versus significant progression of hepatic metastatic disease. Embolization coils noted in the upper abdomen.  Lungs are clear. Minimal dependent atelectasis posteriorly in the lower lobes. 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  Thoracic spine intact.  IMPRESSION: 1.  Negative for acute PE or thoracic aortic dissection. 2. Patchy nodular liver parenchymal changes may represent sequelae of recent radioembolization versus progression of hepatic metastatic disease.   Original Report Authenticated By: D. Andria Rhein, MD    US Abdomen Complete  05/27/2012  *RADIOLOGY REPORT*  Clinical Data:  Nausea and right upper quadrant abdominal pain. Metastatic breast cancer.  COMPLETE ABDOMINAL ULTRASOUND  Comparison:  Chest CTA 05/27/2012.  PET CT 04/15/2012.  Findings:  Gallbladder: Contains multiple small shadowing gallstones.  There is gallbladder wall thickening to 3.7 mm.  Sonographer reports a positive sonographic Murphy's sign.  Common bile duct:   Not well visualized, although without evidence of significant dilatation.  Liver:  The liver is diffusely replaced  by widespread metastatic disease as demonstrated on the earlier CT.  There are probable nodal metastases within the porta caval as well.  IVC:  Visualized portions appear unremarkable.  Pancreas:  Visualized portions appear unremarkable.  Spleen:  Visualized portions appear unremarkable.  Right Kidney:   The renal cortical thickness and echogenicity are preserved.  There is no hydronephrosis or focal abnormality. Renal length is 10.7 cm.  Left Kidney:   The renal cortical thickness and echogenicity are preserved.  There is no hydronephrosis or focal abnormality. Renal length is 10.2 cm.  Abdominal aorta:  Visualized portions appear unremarkable.  IMPRESSION:  1.  Widespread hepatic metastatic disease. 2.  Gallstones and gallbladder wall thickening with positive sonographic Murphy's sign.  Findings suspicious for cholecystitis. No significant biliary dilatation identified.   Original Report Authenticated By: Carey Bullocks, M.D.      1. Cholecystitis       MDM  The patient is at increased risk for pulmonary embolism however she is not tachycardic nor is she hypoxic, she will need evaluation with CT scan secondary to her increased risk, laboratory workup to further evaluate her gallbladder though she is not tender in her right upper quadrant as much and she is in her epigastrium which she states is a chronic pain. Labs, EKG, CT scan.  ED ECG REPORT  I personally interpreted this EKG   Date: 05/27/2012   Rate: 76  Rhythm: normal sinus rhythm  QRS Axis: normal  Intervals: normal  ST/T Wave abnormalities: normal  Conduction Disutrbances:none  Narrative Interpretation:   Old EKG Reviewed: Compared with 02/25/2011, rate is slower today, no other significant changes  Labs c/w liver GB disease, has US showing acute cholecyst  Discuss with general surgery, Dr. gross will admit.    Vida Roller, MD 05/27/12 [phone]

## 2012-05-27 NOTE — ED Notes (Signed)
In with patient with Dr Hyacinth Meeker to discuss ultrasound findings; pt upset regarding wait time; informed of need to speak with consulting surgeon Dr Michaell Cowing by Dr Hyacinth Meeker; family member given water; informed patient for further needs to call desk and ask for charge nurse

## 2012-05-28 ENCOUNTER — Inpatient Hospital Stay (HOSPITAL_COMMUNITY): Payer: BC Managed Care – PPO

## 2012-05-28 DIAGNOSIS — Z01810 Encounter for preprocedural cardiovascular examination: Secondary | ICD-10-CM

## 2012-05-28 DIAGNOSIS — I5022 Chronic systolic (congestive) heart failure: Secondary | ICD-10-CM

## 2012-05-28 LAB — COMPREHENSIVE METABOLIC PANEL
AST: 87 U/L — ABNORMAL HIGH (ref 0–37)
Albumin: 2.5 g/dL — ABNORMAL LOW (ref 3.5–5.2)
Alkaline Phosphatase: 285 U/L — ABNORMAL HIGH (ref 39–117)
BUN: 11 mg/dL (ref 6–23)
CO2: 27 mEq/L (ref 19–32)
Chloride: 103 mEq/L (ref 96–112)
Potassium: 3.4 mEq/L — ABNORMAL LOW (ref 3.5–5.1)
Total Bilirubin: 0.4 mg/dL (ref 0.3–1.2)

## 2012-05-28 LAB — SURGICAL PCR SCREEN: MRSA, PCR: NEGATIVE

## 2012-05-28 LAB — CBC
HCT: 28.8 % — ABNORMAL LOW (ref 36.0–46.0)
RBC: 3.21 MIL/uL — ABNORMAL LOW (ref 3.87–5.11)
RDW: 13.4 % (ref 11.5–15.5)
WBC: 3.7 10*3/uL — ABNORMAL LOW (ref 4.0–10.5)

## 2012-05-28 MED ORDER — SPIRONOLACTONE 25 MG PO TABS
25.0000 mg | ORAL_TABLET | Freq: Every day | ORAL | Status: DC
  Administered 2012-05-28 – 2012-05-30 (×2): 25 mg via ORAL
  Filled 2012-05-28 (×4): qty 1

## 2012-05-28 MED ORDER — LISINOPRIL 10 MG PO TABS
10.0000 mg | ORAL_TABLET | Freq: Every day | ORAL | Status: DC
  Administered 2012-05-28: 10 mg via ORAL
  Filled 2012-05-28 (×2): qty 1

## 2012-05-28 MED ORDER — LORAZEPAM 0.5 MG PO TABS: 0.5000 mg | ORAL_TABLET | Freq: Every evening | ORAL | Status: DC | PRN

## 2012-05-28 MED ORDER — LOSARTAN POTASSIUM 25 MG PO TABS
25.0000 mg | ORAL_TABLET | Freq: Every day | ORAL | Status: DC
  Administered 2012-05-30: 25 mg via ORAL
  Filled 2012-05-28 (×3): qty 1

## 2012-05-28 MED ORDER — TECHNETIUM TC 99M MEBROFENIN IV KIT
5.1000 | PACK | Freq: Once | INTRAVENOUS | Status: AC | PRN
  Administered 2012-05-28: 5 via INTRAVENOUS

## 2012-05-28 NOTE — Progress Notes (Signed)
Patient ID: Kathy Jimenez, female   DOB: 1970-10-13, 42 y.o.   MRN: [id number] I have reviewed all data and history.  On exam she has ruq tenderness. She certainly has complicated history with stage IV breast cancer associated with hepatic metastases and recent Y90 treatment.  Her history, exam, [place] and HIDA appear that she does indeed have cholecystitis.  I had along talk with Kathy Jimenez and recommended proceeding with laparoscopic cholecystectomy.  We discussed possibility of open chole as I think this may be difficult.  We also discussed if upon entering her abdomen if I find extensive disease and does not appear that cholecystitis diagnosis is correct we might abort procedure.  We also discussed other risks including bleeding, infection, cystic duct leak, injury to surrounding structures. We discussed nonoperative treatment but I think she will be back soon if we do this and I don't think a perc drain will achieve any success with her and may just make things worse.  Will proceed tomorrow with lap chole.

## 2012-05-28 NOTE — Progress Notes (Signed)
Patient ID: Kathy Jimenez, female   DOB: 31-May-1970, 42 y.o.   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  Now admitted with RUQ pain, worse with deep inspiration.  Ultrasound showed gallstones, sludge and gallbladder wall thickening.  CT demonstrated no PE and evidence of probable significant progression of metastatic disease in right lobe of liver in visualized upper liver.  Left lobe not assessed by chest CTA.  NM study showed nonfilling of GB and linear activity in right hemidiaphragm.  This activity is likely related to some nontarget embolization to a phrenic branch of the left hepatic artery at the time of radioembolization.  CT does not show marked elevation of right HD and patient is not in acute respiratory distress to suggest significant diaphragmatic paralysis or injury.  However, this could be contributing to her symptoms.  Probable progression of hepatic metastatic disease discussed with Dr. Welton Flakes who will restage patient once cholecystitis addressed and treated.

## 2012-05-28 NOTE — Progress Notes (Signed)
Discussed with Tonny Bollman, on call for Dr. Gala Romney for cardiology.  Will obtain EKG And they will see later this morning so that the patient can hopefully be cleared for surgery.

## 2012-05-28 NOTE — ED Notes (Signed)
Attempt to call report x 1.  RN unable.

## 2012-05-28 NOTE — Progress Notes (Signed)
Subjective: Still with the same RUQ pain.  Objective: Vital signs in last 24 hours: Temp:  [97.7 F (36.5 C)-98.3 F (36.8 C)] 97.8 F (36.6 C) (02/18 1610) Pulse Rate:  [71-89] 88 (02/18 0638) Resp:  [16-18] 18 (02/18 0638) BP: (101-126)/(67-82) 120/82 mmHg (02/18 0638) SpO2:  [97 %-100 %] 97 % (02/18 0638) Weight:  [144 lb (65.318 kg)] 144 lb (65.318 kg) (02/18 0042) Last BM Date: 05/27/12  Intake/Output from previous day: 02/17 0701 - 02/18 0700 In: 498.3 [I.V.:498.3] Out: 200 [Urine:200] Intake/Output this shift:    PE: General- In NAD, looks remarkably well Abdomen-soft, tender in RUQ  Lab Results:   Recent Labs  05/27/12 1830 05/28/12 0525  WBC 6.8 3.7*  HGB 11.2* 9.8*  HCT 33.0* 28.8*  PLT 222 141*   BMET  Recent Labs  05/27/12 1830 05/28/12 0525  NA 135 139  K 3.7 3.4*  CL 98 103  CO2 23 27  GLUCOSE 81 92  BUN 14 11  CREATININE 0.82 0.76  CALCIUM 9.0 8.2*   PT/INR No results found for this basename: LABPROT, INR,  in the last 72 hours Comprehensive Metabolic Panel:    Component Value Date/Time   NA 139 05/28/2012 0525   NA 139 04/11/2012 0815   NA 141 09/02/2010 1134   K 3.4* 05/28/2012 0525   K 4.7 04/11/2012 0815   K 4.3 09/02/2010 1134   CL 103 05/28/2012 0525   CL 106 04/11/2012 0815   CL 103 09/02/2010 1134   CO2 27 05/28/2012 0525   CO2 24 04/11/2012 0815   CO2 25 09/02/2010 1134   BUN 11 05/28/2012 0525   BUN 18.0 04/11/2012 0815   BUN 11 09/02/2010 1134   CREATININE 0.76 05/28/2012 0525   CREATININE 0.8 04/11/2012 0815   CREATININE 0.7 09/02/2010 1134   GLUCOSE 92 05/28/2012 0525   GLUCOSE 98 04/11/2012 0815   GLUCOSE 94 09/02/2010 1134   CALCIUM 8.2* 05/28/2012 0525   CALCIUM 9.3 04/11/2012 0815   CALCIUM 8.4 09/02/2010 1134   AST 87* 05/28/2012 0525   AST 45* 04/11/2012 0815   AST 27 09/02/2010 1134   ALT 53* 05/28/2012 0525   ALT 40 04/11/2012 0815   ALKPHOS 285* 05/28/2012 0525   ALKPHOS 74 04/11/2012 0815   ALKPHOS 84 09/02/2010 1134   BILITOT  0.4 05/28/2012 0525   BILITOT 0.65 04/11/2012 0815   BILITOT 0.60 09/02/2010 1134   PROT 5.8* 05/28/2012 0525   PROT 7.0 04/11/2012 0815   PROT 6.7 09/02/2010 1134   ALBUMIN 2.5* 05/28/2012 0525   ALBUMIN 3.8 04/11/2012 0815     Studies/Results: Ct Angio Chest Pe W/cm &/or Wo Cm  05/27/2012  *RADIOLOGY REPORT*  Clinical Data: Right chest and back pain.  Metastatic breast carcinoma. Liver radioembolization 04/30/2012.  CT ANGIOGRAPHY CHEST  Technique:  Multidetector CT imaging of the chest using the standard protocol during bolus administration of intravenous contrast. Multiplanar reconstructed images including MIPs were obtained and reviewed to evaluate the vascular anatomy.  Contrast: 60mL OMNIPAQUE IOHEXOL 350 MG/ML SOLN  Comparison: PET CT 04/15/2012  Findings: There is good contrast opacification of the pulmonary artery branches.  No discrete filling defect to suggest acute PE.Adequate contrast opacification of the thoracic aorta with no evidence of dissection, aneurysm, or stenosis. There is classic 3- vessel brachiocephalic arch anatomy.  No pleural or pericardial effusion.  Right IJ port catheter extends to the proximal right atrium.  No hilar or mediastinal adenopathy.  Innumerable low attenuation liver lesions,  much more extensive and apparent than on prior PET CT, possibly related to interval radioembolization versus significant progression of hepatic metastatic disease. Embolization coils noted in the upper abdomen.  Lungs are clear. Minimal dependent atelectasis posteriorly in the lower lobes. 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  Thoracic spine intact.  IMPRESSION: 1.  Negative for acute PE or thoracic aortic dissection. 2. Patchy nodular liver parenchymal changes may represent sequelae of recent radioembolization versus progression of hepatic metastatic disease.   Original Report Authenticated By: D. Andria Rhein, MD    US Abdomen Complete  05/27/2012  *RADIOLOGY REPORT*  Clinical Data:   Nausea and right upper quadrant abdominal pain. Metastatic breast cancer.  COMPLETE ABDOMINAL ULTRASOUND  Comparison:  Chest CTA 05/27/2012.  PET CT 04/15/2012.  Findings:  Gallbladder: Contains multiple small shadowing gallstones.  There is gallbladder wall thickening to 3.7 mm.  Sonographer reports a positive sonographic Murphy's sign.  Common bile duct:   Not well visualized, although without evidence of significant dilatation.  Liver:  The liver is diffusely replaced by widespread metastatic disease as demonstrated on the earlier CT.  There are probable nodal metastases within the porta caval as well.  IVC:  Visualized portions appear unremarkable.  Pancreas:  Visualized portions appear unremarkable.  Spleen:  Visualized portions appear unremarkable.  Right Kidney:   The renal cortical thickness and echogenicity are preserved.  There is no hydronephrosis or focal abnormality. Renal length is 10.7 cm.  Left Kidney:   The renal cortical thickness and echogenicity are preserved.  There is no hydronephrosis or focal abnormality. Renal length is 10.2 cm.  Abdominal aorta:  Visualized portions appear unremarkable.  IMPRESSION:  1.  Widespread hepatic metastatic disease. 2.  Gallstones and gallbladder wall thickening with positive sonographic Murphy's sign.  Findings suspicious for cholecystitis. No significant biliary dilatation identified.   Original Report Authenticated By: Carey Bullocks, M.D.     Anti-infectives: Anti-infectives   Start     Dose/Rate Route Frequency Ordered Stop   05/27/12 2359  Ampicillin-Sulbactam (UNASYN) 3 g in sodium chloride 0.9 % 100 mL IVPB     3 g 100 mL/hr over 60 Minutes Intravenous Every 6 hours 05/27/12 2327        Assessment Principal Problem:   Acute calculous cholecystitis vs. Tumor necrosis- WBC down; LFTs up some on admission and now trending down; On IV Unasyn. Active Problems:   Chronic systolic heart failure   Kidney stones   Breast cancer metastasized to  multiple sites    LOS: 1 day   Plan: HIDA scan today to evaluate for acute cholecystitis.   Adolph Pollack 05/28/2012

## 2012-05-28 NOTE — Progress Notes (Signed)
I saw her this afternoon. Her HIDA scan demonstrates no filling of the gallbladder.  Her clinical course, laboratory, and radiologic findings are all consistently with cholecystitis.  Laparoscopic, possible open cholecystectomy was recommended to her for 2/19.  Dr. Dwain Sarna has availability and this was discussed with her.  I have explained the procedure, risks, and aftercare of cholecystectomy.  Risks include but are not limited to bleeding, infection, wound problems, anesthesia, diarrhea, bile leak, injury to common bile duct/liver/intestine.  She seems to understand and agrees to proceed.

## 2012-05-28 NOTE — Consult Note (Signed)
CARDIOLOGY CONSULT NOTE  Patient ID: Kathy Jimenez MRN: [id number] DOB/AGE: May 18, 1970 42 y.o.  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 She has presented to the hospital with abdominal pain. She describes pleuritic and constant right upper quadrant pain over the past week. Her pain radiates to the back and shoulder. She has been diagnosed with cholecystitis and laparoscopic cholecystectomy has been recommended. The patient denies fevers or chills.  From a cardiac perspective, she has been doing well. She complains of a nonproductive cough and relates this to her ACE inhibitor. She otherwise has no specific complaints. She denies chest pain, orthopnea, PND, leg swelling, palpitations, lightheadedness, or syncope. She does admit to chronic exertional dyspnea and this is unchanged over time.  The patient has had serial echocardiograms, initially with a left ventricular ejection fraction less than 30%. She has responded well to medical therapy and most recent assessment of her LVEF showed complete recovery with an ejection fraction of 60%. Her diastolic filling parameters were normal. Her last echocardiogram was in December 2013.  Past Medical History  Diagnosis Date  . Breast cancer 05/2002  . Kidney stones     recurrent since 1989  . Endometrial polyp   . PONV (postoperative nausea and vomiting)   . CHF (congestive heart failure)     from chemo treatment  . Breast cancer metastasized to multiple sites  April2012    Kidney, liver, bones (sternum, L1, left symphysis pubis)  . Urethral stricture      Past Surgical History  Procedure Laterality Date  . Hysteroscopy w/ endometrial ablation  03/11/2009    resection of endometrial  polyps, total endometrial resection  . Breast lumpectomy  2004  . Cystoscopy with ureteroscopy  03/24/2011    Procedure: CYSTOSCOPY WITH URETEROSCOPY;  Surgeon: Garnett Farm, MD;  Location: [facility];  Service: Urology;  Laterality: Right;  dilitation of ureteral stricture     No family history on file.  Social History: History   Social History  . Marital Status: Divorced    Spouse Name: N/A    Number of Children: N/A  . Years of Education: N/A   Occupational History  . Director of [employer]   Social History Main Topics  . Smoking status: Never Smoker   . Smokeless tobacco: Never Used  . Alcohol Use: 0.6 oz/week    1 Glasses of wine per week     Comment: rare  . Drug Use: No  . Sexually Active: No   Other Topics Concern  . Not on file   Social History Narrative  . No narrative on file     Prescriptions prior to admission  Medication Sig Dispense Refill  . carvedilol (COREG) 6.25 MG tablet Take 6.25 mg by mouth 2 (two) times daily with a meal.      . letrozole (FEMARA) 2.5 MG tablet Take 2.5 mg by mouth every evening.      . lidocaine-prilocaine (EMLA) cream Apply 1 application topically daily as needed. For port-a-cath.      . lisinopril (PRINIVIL,ZESTRIL) 10 MG tablet Take 10 mg by mouth 2 (two) times daily.      . Potassium Citrate-Citric Acid (CYTRA K CRYSTALS) 3300-1002 MG PACK Take 1 packet by mouth daily. Mix in 6 ounces of water or juice.      Marland Kitchen  spironolactone (ALDACTONE) 25 MG tablet Take 25 mg by mouth every morning.        ROS: General: no fevers/chills/night sweats Eyes: no blurry vision, diplopia, or amaurosis ENT: no sore throat or hearing loss Resp: no wheezing, or hemoptysis. Positive for a chronic nonproductive cough. CV: no edema or palpitations GI: See history of present illness GU: no dysuria, frequency, or hematuria Skin: no rash Neuro: no headache, numbness, tingling, or weakness of extremities Musculoskeletal: no joint pain or  swelling Heme: no bleeding, DVT, or easy bruising Endo: no polydipsia or polyuria    Physical Exam: Blood pressure 120/82, pulse 88, temperature 97.8 F (36.6 C), temperature source Oral, resp. rate 18, height 5\' 4"  (1.626 m), weight 65.318 kg (144 lb), SpO2 97.00%.  Pt is alert and oriented, WD, WN, in no distress. HEENT: normal Neck: JVP normal. Carotid upstrokes normal without bruits. No thyromegaly. Lungs: equal expansion, clear bilaterally CV: Apex is discrete and nondisplaced, RRR without murmur or gallop Abd: soft, +BS, no bruit, there is tenderness in the right upper quadrant without rebound or guarding Back: no CVA tenderness Ext: no C/C/E        Femoral pulses 2+= without bruits        DP/PT pulses intact and = Skin: warm and dry without rash Neuro: CNII-XII intact             Strength intact = bilaterally  Labs:   Lab Results  Component Value Date   WBC 3.7* 05/28/2012   HGB 9.8* 05/28/2012   HCT 28.8* 05/28/2012   MCV 89.7 05/28/2012   PLT 141* 05/28/2012    Recent Labs Lab 05/28/12 0525  NA 139  K 3.4*  CL 103  CO2 27  BUN 11  CREATININE 0.76  CALCIUM 8.2*  PROT 5.8*  BILITOT 0.4  ALKPHOS 285*  ALT 53*  AST 87*  GLUCOSE 92   Lab Results  Component Value Date   CKTOTAL 61 02/26/2011   CKMB 2.5 02/26/2011   TROPONINI <0.30 02/26/2011    No results found for this basename: CHOL   No results found for this basename: HDL   No results found for this basename: LDLCALC   No results found for this basename: TRIG   No results found for this basename: CHOLHDL   No results found for this basename: LDLDIRECT      Radiology: Ct Angio Chest Pe W/cm &/or Wo Cm  05/27/2012  *RADIOLOGY REPORT*  Clinical Data: Right chest and back pain.  Metastatic breast carcinoma. Liver radioembolization 04/30/2012.  CT ANGIOGRAPHY CHEST  Technique:  Multidetector CT imaging of the chest using the standard protocol during bolus administration of intravenous contrast.  Multiplanar reconstructed images including MIPs were obtained and reviewed to evaluate the vascular anatomy.  Contrast: 60mL OMNIPAQUE IOHEXOL 350 MG/ML SOLN  Comparison: PET CT 04/15/2012  Findings: There is good contrast opacification of the pulmonary artery branches.  No discrete filling defect to suggest acute PE.Adequate contrast opacification of the thoracic aorta with no evidence of dissection, aneurysm, or stenosis. There is classic 3- vessel brachiocephalic arch anatomy.  No pleural or pericardial effusion.  Right IJ port catheter extends to the proximal right atrium.  No hilar or mediastinal adenopathy.  Innumerable low attenuation liver lesions, much more extensive and apparent than on prior PET CT, possibly related to interval radioembolization versus significant progression of hepatic metastatic disease. Embolization coils noted in the upper abdomen.  Lungs are clear. Minimal dependent atelectasis posteriorly in the lower lobes.  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  Thoracic spine intact.  IMPRESSION: 1.  Negative for acute PE or thoracic aortic dissection. 2. Patchy nodular liver parenchymal changes may represent sequelae of recent radioembolization versus progression of hepatic metastatic disease.   Original Report Authenticated By: D. Andria Rhein, MD    US Abdomen Complete  05/27/2012  *RADIOLOGY REPORT*  Clinical Data:  Nausea and right upper quadrant abdominal pain. Metastatic breast cancer.  COMPLETE ABDOMINAL ULTRASOUND  Comparison:  Chest CTA 05/27/2012.  PET CT 04/15/2012.  Findings:  Gallbladder: Contains multiple small shadowing gallstones.  There is gallbladder wall thickening to 3.7 mm.  Sonographer reports a positive sonographic Murphy's sign.  Common bile duct:   Not well visualized, although without evidence of significant dilatation.  Liver:  The liver is diffusely replaced by widespread metastatic disease as demonstrated on the earlier CT.  There are probable nodal  metastases within the porta caval as well.  IVC:  Visualized portions appear unremarkable.  Pancreas:  Visualized portions appear unremarkable.  Spleen:  Visualized portions appear unremarkable.  Right Kidney:   The renal cortical thickness and echogenicity are preserved.  There is no hydronephrosis or focal abnormality. Renal length is 10.7 cm.  Left Kidney:   The renal cortical thickness and echogenicity are preserved.  There is no hydronephrosis or focal abnormality. Renal length is 10.2 cm.  Abdominal aorta:  Visualized portions appear unremarkable.  IMPRESSION:  1.  Widespread hepatic metastatic disease. 2.  Gallstones and gallbladder wall thickening with positive sonographic Murphy's sign.  Findings suspicious for cholecystitis. No significant biliary dilatation identified.   Original Report Authenticated By: Carey Bullocks, M.D.     EKG: Normal sinus rhythm, within normal limits. There are no significant ST segment changes.  2-D echocardiogram 03/14/2012: Left ventricle: The cavity size was normal. Wall thickness was normal. Systolic function was normal. The estimated ejection fraction was in the range of 55% to 60%. Wall motion was normal; there were no regional wall motion abnormalities. The transmitral flow pattern was normal. The deceleration time of the early transmitral flow velocity was normal. The pulmonary vein flow pattern was normal. The tissue Doppler parameters were normal. Left ventricular diastolic function parameters were normal.  ------------------------------------------------------------ Aortic valve: Trileaflet; normal thickness leaflets. Mobility was not restricted. Doppler: Transvalvular velocity was within the normal range. There was no stenosis. No regurgitation.  ------------------------------------------------------------ Aorta: Aortic root: The aortic root was normal in size.  ------------------------------------------------------------ Mitral valve:  Structurally normal valve. Mobility was not restricted. Doppler: Transvalvular velocity was within the normal range. There was no evidence for stenosis. Trivial regurgitation. Peak gradient: 4mm Hg (D).  ------------------------------------------------------------ Left atrium: The atrium was normal in size.  ------------------------------------------------------------ Right ventricle: The cavity size was normal. Systolic function was normal.  ------------------------------------------------------------ Pulmonic valve: Doppler: Transvalvular velocity was within the normal range. There was no evidence for stenosis.  ------------------------------------------------------------ Tricuspid valve: Structurally normal valve. Doppler: Transvalvular velocity was within the normal range. Mild regurgitation.  ------------------------------------------------------------ Right atrium: The atrium was normal in size.  ------------------------------------------------------------ Pericardium: There was no pericardial effusion.  ------------------------------------------------------------ Systemic veins: Inferior vena cava: The vessel was normal in size.  ASSESSMENT AND PLAN: 42 year old woman with chronic systolic heart failure, currently well compensated, for preoperative evaluation before laparoscopic cholecystectomy for treatment of acute cholecystitis. The patient is at low cardiac risk of surgery. She has had normalization of LV function with no evidence of volume overload or active congestive heart failure at the present time. Her EKG is essentially within  normal limits. She is tolerating her current medical program well. She can proceed with surgery without further cardiac testing preoperatively. We will followup with her postoperatively and make sure that there are no problems with her volume status. Will change her ACE inhibitor to an angiotensin receptor blocker as her chronic cough is  bothersome to her. Her carvedilol should be continued perioperatively.  Tonny Bollman 05/28/2012, 8:58 AM

## 2012-05-29 ENCOUNTER — Encounter (HOSPITAL_COMMUNITY): Payer: Self-pay | Admitting: Certified Registered Nurse Anesthetist

## 2012-05-29 ENCOUNTER — Inpatient Hospital Stay (HOSPITAL_COMMUNITY): Payer: BC Managed Care – PPO | Admitting: Certified Registered Nurse Anesthetist

## 2012-05-29 ENCOUNTER — Encounter (HOSPITAL_COMMUNITY): Admission: EM | Disposition: A | Discharge status: Home / Self Care | Payer: Self-pay | Source: Home / Self Care

## 2012-05-29 DIAGNOSIS — K8010 Calculus of gallbladder with chronic cholecystitis without obstruction: Secondary | ICD-10-CM

## 2012-05-29 HISTORY — PX: CHOLECYSTECTOMY: SHX55

## 2012-05-29 SURGERY — LAPAROSCOPIC CHOLECYSTECTOMY WITH INTRAOPERATIVE CHOLANGIOGRAM
Anesthesia: General | Site: Abdomen | Wound class: Clean Contaminated

## 2012-05-29 MED ORDER — BUPIVACAINE-EPINEPHRINE (PF) 0.25% -1:200000 IJ SOLN
INTRAMUSCULAR | Status: DC | PRN
  Administered 2012-05-29: 50 mL

## 2012-05-29 MED ORDER — FENTANYL CITRATE 0.05 MG/ML IJ SOLN: 25.0000 ug | INTRAMUSCULAR | Status: DC | PRN

## 2012-05-29 MED ORDER — LACTATED RINGERS IV SOLN
INTRAVENOUS | Status: DC | PRN
  Administered 2012-05-29: 1000 mL via INTRAVENOUS

## 2012-05-29 MED ORDER — HYDROMORPHONE HCL PF 1 MG/ML IJ SOLN: 0.5000 mg | INTRAMUSCULAR | Status: DC | PRN

## 2012-05-29 MED ORDER — METOCLOPRAMIDE HCL 5 MG/ML IJ SOLN
INTRAMUSCULAR | Status: DC | PRN
  Administered 2012-05-29: 10 mg via INTRAVENOUS

## 2012-05-29 MED ORDER — FENTANYL CITRATE 0.05 MG/ML IJ SOLN
INTRAMUSCULAR | Status: DC | PRN
  Administered 2012-05-29 (×3): 50 ug via INTRAVENOUS

## 2012-05-29 MED ORDER — DEXAMETHASONE SODIUM PHOSPHATE 10 MG/ML IJ SOLN
INTRAMUSCULAR | Status: DC | PRN
  Administered 2012-05-29: 10 mg via INTRAVENOUS

## 2012-05-29 MED ORDER — GLYCOPYRROLATE 0.2 MG/ML IJ SOLN
INTRAMUSCULAR | Status: DC | PRN
  Administered 2012-05-29: 0.6 mg via INTRAVENOUS

## 2012-05-29 MED ORDER — IOHEXOL 300 MG/ML  SOLN
INTRAMUSCULAR | Status: DC | PRN
  Administered 2012-05-29: 50 mL via INTRAVENOUS

## 2012-05-29 MED ORDER — LACTATED RINGERS IV SOLN
INTRAVENOUS | Status: DC
  Administered 2012-05-29: 1000 mL via INTRAVENOUS

## 2012-05-29 MED ORDER — SODIUM CHLORIDE 0.9 % IV SOLN
INTRAVENOUS | Status: DC
  Administered 2012-05-29: 17:00:00 via INTRAVENOUS
  Administered 2012-05-30: 75 mL/h via INTRAVENOUS

## 2012-05-29 MED ORDER — SUCCINYLCHOLINE CHLORIDE 20 MG/ML IJ SOLN
INTRAMUSCULAR | Status: DC | PRN
  Administered 2012-05-29: 100 mg via INTRAVENOUS

## 2012-05-29 MED ORDER — SCOPOLAMINE 1 MG/3DAYS TD PT72
MEDICATED_PATCH | TRANSDERMAL | Status: DC | PRN
  Administered 2012-05-29: 1 via TRANSDERMAL

## 2012-05-29 MED ORDER — LIDOCAINE HCL (CARDIAC) 20 MG/ML IV SOLN
INTRAVENOUS | Status: DC | PRN
  Administered 2012-05-29: 100 mg via INTRAVENOUS

## 2012-05-29 MED ORDER — MIDAZOLAM HCL 5 MG/5ML IJ SOLN
INTRAMUSCULAR | Status: DC | PRN
  Administered 2012-05-29: 2 mg via INTRAVENOUS

## 2012-05-29 MED ORDER — ROCURONIUM BROMIDE 100 MG/10ML IV SOLN
INTRAVENOUS | Status: DC | PRN
  Administered 2012-05-29: 20 mg via INTRAVENOUS
  Administered 2012-05-29: 10 mg via INTRAVENOUS

## 2012-05-29 MED ORDER — OXYCODONE-ACETAMINOPHEN 5-325 MG PO TABS: 1.0000 | ORAL_TABLET | ORAL | Status: DC | PRN

## 2012-05-29 MED ORDER — PROPOFOL 10 MG/ML IV BOLUS
INTRAVENOUS | Status: DC | PRN
  Administered 2012-05-29: 130 mg via INTRAVENOUS

## 2012-05-29 MED ORDER — LACTATED RINGERS IV SOLN: INTRAVENOUS | Status: DC

## 2012-05-29 MED ORDER — SODIUM CHLORIDE 0.9 % IJ SOLN
10.0000 mL | INTRAMUSCULAR | Status: DC | PRN
  Administered 2012-05-30: 10 mL

## 2012-05-29 MED ORDER — ONDANSETRON HCL 4 MG/2ML IJ SOLN
INTRAMUSCULAR | Status: DC | PRN
  Administered 2012-05-29: 4 mg via INTRAVENOUS

## 2012-05-29 MED ORDER — NEOSTIGMINE METHYLSULFATE 1 MG/ML IJ SOLN
INTRAMUSCULAR | Status: DC | PRN
  Administered 2012-05-29: 5 mg via INTRAVENOUS

## 2012-05-29 SURGICAL SUPPLY — 38 items
APPLIER CLIP 5 13 M/L LIGAMAX5 (MISCELLANEOUS) ×2
APPLIER CLIP ROT 10 11.4 M/L (STAPLE)
BENZOIN TINCTURE PRP APPL 2/3 (GAUZE/BANDAGES/DRESSINGS) IMPLANT
CANISTER SUCTION 2500CC (MISCELLANEOUS) ×2 IMPLANT
CLIP APPLIE 5 13 M/L LIGAMAX5 (MISCELLANEOUS) ×1 IMPLANT
CLIP APPLIE ROT 10 11.4 M/L (STAPLE) IMPLANT
CLOTH BEACON ORANGE TIMEOUT ST (SAFETY) ×2 IMPLANT
COVER MAYO STAND STRL (DRAPES) IMPLANT
DECANTER SPIKE VIAL GLASS SM (MISCELLANEOUS) ×2 IMPLANT
DERMABOND ADVANCED (GAUZE/BANDAGES/DRESSINGS) ×1
DERMABOND ADVANCED .7 DNX12 (GAUZE/BANDAGES/DRESSINGS) ×1 IMPLANT
DRAPE C-ARM 42X72 X-RAY (DRAPES) IMPLANT
DRAPE LAPAROSCOPIC ABDOMINAL (DRAPES) ×2 IMPLANT
ELECT REM PT RETURN 9FT ADLT (ELECTROSURGICAL) ×2
ELECTRODE REM PT RTRN 9FT ADLT (ELECTROSURGICAL) ×1 IMPLANT
GLOVE BIO SURGEON STRL SZ7 (GLOVE) ×2 IMPLANT
GLOVE BIOGEL PI IND STRL 7.5 (GLOVE) ×1 IMPLANT
GLOVE BIOGEL PI INDICATOR 7.5 (GLOVE) ×1
GOWN PREVENTION PLUS LG XLONG (DISPOSABLE) ×2 IMPLANT
GOWN PREVENTION PLUS XLARGE (GOWN DISPOSABLE) ×2 IMPLANT
GOWN STRL NON-REIN LRG LVL3 (GOWN DISPOSABLE) ×2 IMPLANT
GOWN STRL REIN XL XLG (GOWN DISPOSABLE) ×2 IMPLANT
KIT BASIN OR (CUSTOM PROCEDURE TRAY) ×2 IMPLANT
NS IRRIG 1000ML POUR BTL (IV SOLUTION) ×2 IMPLANT
POUCH SPECIMEN RETRIEVAL 10MM (ENDOMECHANICALS) IMPLANT
SCISSORS LAP 5X35 DISP (ENDOMECHANICALS) ×2 IMPLANT
SET CHOLANGIOGRAPH MIX (MISCELLANEOUS) IMPLANT
SET IRRIG TUBING LAPAROSCOPIC (IRRIGATION / IRRIGATOR) ×2 IMPLANT
SOLUTION ANTI FOG 6CC (MISCELLANEOUS) ×2 IMPLANT
STRIP CLOSURE SKIN 1/2X4 (GAUZE/BANDAGES/DRESSINGS) ×2 IMPLANT
SUT MNCRL AB 4-0 PS2 18 (SUTURE) ×2 IMPLANT
TOWEL OR 17X26 10 PK STRL BLUE (TOWEL DISPOSABLE) ×2 IMPLANT
TRAY LAP CHOLE (CUSTOM PROCEDURE TRAY) ×2 IMPLANT
TROCAR BLADELESS OPT 5 75 (ENDOMECHANICALS) ×2 IMPLANT
TROCAR SLEEVE XCEL 5X75 (ENDOMECHANICALS) ×4 IMPLANT
TROCAR XCEL BLUNT TIP 100MML (ENDOMECHANICALS) ×2 IMPLANT
TROCAR XCEL NON-BLD 11X100MML (ENDOMECHANICALS) IMPLANT
TUBING INSUFFLATION 10FT LAP (TUBING) ×2 IMPLANT

## 2012-05-29 NOTE — Anesthesia Postprocedure Evaluation (Signed)
  Anesthesia Post-op Note  Patient: Kathy Jimenez  Procedure(s) Performed: Procedure(s) (LRB): LAPAROSCOPIC CHOLECYSTECTOMY WITH INTRAOPERATIVE CHOLANGIOGRAM (N/A)  Patient Location: PACU  Anesthesia Type: General  Level of Consciousness: awake and alert   Airway and Oxygen Therapy: Patient Spontanous Breathing  Post-op Pain: mild  Post-op Assessment: Post-op Vital signs reviewed, Patient's Cardiovascular Status Stable, Respiratory Function Stable, Patent Airway and No signs of Nausea or vomiting  Last Vitals:  Filed Vitals:   05/29/12 1614  BP: 109/65  Pulse: 76  Temp: 37.2 C  Resp:     Post-op Vital Signs: stable   Complications: No apparent anesthesia complications

## 2012-05-29 NOTE — Transfer of Care (Signed)
Immediate Anesthesia Transfer of Care Note  Patient: Kathy Jimenez  Procedure(s) Performed: Procedure(s) (LRB): LAPAROSCOPIC CHOLECYSTECTOMY WITH INTRAOPERATIVE CHOLANGIOGRAM (N/A)  Patient Location: PACU  Anesthesia Type: General  Level of Consciousness: sedated, patient cooperative and responds to stimulaton  Airway & Oxygen Therapy: Patient Spontanous Breathing and Patient connected to face mask oxgen  Post-op Assessment: Report given to PACU RN and Post -op Vital signs reviewed and stable  Post vital signs: Reviewed and stable  Complications: No apparent anesthesia complications

## 2012-05-29 NOTE — Progress Notes (Signed)
Subjective: Excited about having surgery, says it hurts to breath.  She had her last Chemotherapy in DEC, and Sphere's implanted in January.  Objective: Vital signs in last 24 hours: Temp:  [97.6 F (36.4 C)-98.7 F (37.1 C)] 98.7 F (37.1 C) (02/19 0622) Pulse Rate:  [74-86] 86 (02/19 0622) Resp:  [16-20] 16 (02/19 0622) BP: (95-117)/(66-77) 101/69 mmHg (02/19 0622) SpO2:  [97 %-98 %] 97 % (02/19 0622) Last BM Date: 05/27/12 Afebrile, VSS, No labs today, + HIDA scan  Intake/Output from previous day: 02/18 0701 - 02/19 0700 In: 3000 [P.O.:600; I.V.:2400] Out: 500 [Urine:500] Intake/Output this shift:    General appearance: alert, cooperative and no distress GI: soft, tender mid and RUQ of her abdomen. +BS, no distension.  Lab Results:   Recent Labs  05/27/12 1830 05/28/12 0525  WBC 6.8 3.7*  HGB 11.2* 9.8*  HCT 33.0* 28.8*  PLT 222 141*    BMET  Recent Labs  05/27/12 1830 05/28/12 0525  NA 135 139  K 3.7 3.4*  CL 98 103  CO2 23 27  GLUCOSE 81 92  BUN 14 11  CREATININE 0.82 0.76  CALCIUM 9.0 8.2*   PT/INR No results found for this basename: LABPROT, INR,  in the last 72 hours   Recent Labs Lab 05/27/12 1830 05/28/12 0525  AST 114* 87*  ALT 68* 53*  ALKPHOS 349* 285*  BILITOT 0.5 0.4  PROT 7.1 5.8*  ALBUMIN 3.2* 2.5*     Lipase     Component Value Date/Time   LIPASE 35 05/27/2012 1830     Studies/Results: Ct Angio Chest Pe W/cm &/or Wo Cm  05/27/2012  *RADIOLOGY REPORT*  Clinical Data: Right chest and back pain.  Metastatic breast carcinoma. Liver radioembolization 04/30/2012.  CT ANGIOGRAPHY CHEST  Technique:  Multidetector CT imaging of the chest using the standard protocol during bolus administration of intravenous contrast. Multiplanar reconstructed images including MIPs were obtained and reviewed to evaluate the vascular anatomy.  Contrast: 60mL OMNIPAQUE IOHEXOL 350 MG/ML SOLN  Comparison: PET CT 04/15/2012  Findings: There is good  contrast opacification of the pulmonary artery branches.  No discrete filling defect to suggest acute PE.Adequate contrast opacification of the thoracic aorta with no evidence of dissection, aneurysm, or stenosis. There is classic 3- vessel brachiocephalic arch anatomy.  No pleural or pericardial effusion.  Right IJ port catheter extends to the proximal right atrium.  No hilar or mediastinal adenopathy.  Innumerable low attenuation liver lesions, much more extensive and apparent than on prior PET CT, possibly related to interval radioembolization versus significant progression of hepatic metastatic disease. Embolization coils noted in the upper abdomen.  Lungs are clear. Minimal dependent atelectasis posteriorly in the lower lobes. 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  Thoracic spine intact.  IMPRESSION: 1.  Negative for acute PE or thoracic aortic dissection. 2. Patchy nodular liver parenchymal changes may represent sequelae of recent radioembolization versus progression of hepatic metastatic disease.   Original Report Authenticated By: D. Andria Rhein, MD    Nm Hepatobiliary Liver Func  05/28/2012  *RADIOLOGY REPORT*  Clinical Data:  42 year old female with metastatic breast cancer. Status post Y 90 micro sphere treatment of liver metastases January 2014.  Right upper quadrant pain and nausea.  Abnormal gallbladder on recent ultrasound.  NUCLEAR MEDICINE HEPATOBILIARY IMAGING  Technique:  Sequential images of the abdomen were obtained out to 60 minutes following intravenous administration of radiopharmaceutical.  Radiopharmaceutical:  5.43mCi Tc-65m Choletec  Comparison:  Abdominal ultrasound 05/27/2012.  Chest  CT 11/10/2015 14.  Findings: Liver uptake of the radiotracer is mildly heterogeneous, the blood pool clears relatively quickly.  There is curvilinear activity at the level of the diaphragm, present on the first image and unchanged throughout the study. Legrand Rams this reflects a non target embolization  related to the January Y 90 therapy.  There is prompt activity in the CBD and small bowel.  Small bowel activity increases, but after 2 hours of imaging no convincing gallbladder activity is identified.  IMPRESSION: No gallbladder filling.  CBD is patent.  Extra-hepatic activity which is unchanged throughout the study probably reflects diaphragm activity from non-target Y90 embolization in January.  Study reviewed with Dr. Irish Lack.   Original Report Authenticated By: Erskine Speed, M.D.    US Abdomen Complete  05/27/2012  *RADIOLOGY REPORT*  Clinical Data:  Nausea and right upper quadrant abdominal pain. Metastatic breast cancer.  COMPLETE ABDOMINAL ULTRASOUND  Comparison:  Chest CTA 05/27/2012.  PET CT 04/15/2012.  Findings:  Gallbladder: Contains multiple small shadowing gallstones.  There is gallbladder wall thickening to 3.7 mm.  Sonographer reports a positive sonographic Murphy's sign.  Common bile duct:   Not well visualized, although without evidence of significant dilatation.  Liver:  The liver is diffusely replaced by widespread metastatic disease as demonstrated on the earlier CT.  There are probable nodal metastases within the porta caval as well.  IVC:  Visualized portions appear unremarkable.  Pancreas:  Visualized portions appear unremarkable.  Spleen:  Visualized portions appear unremarkable.  Right Kidney:   The renal cortical thickness and echogenicity are preserved.  There is no hydronephrosis or focal abnormality. Renal length is 10.7 cm.  Left Kidney:   The renal cortical thickness and echogenicity are preserved.  There is no hydronephrosis or focal abnormality. Renal length is 10.2 cm.  Abdominal aorta:  Visualized portions appear unremarkable.  IMPRESSION:  1.  Widespread hepatic metastatic disease. 2.  Gallstones and gallbladder wall thickening with positive sonographic Murphy's sign.  Findings suspicious for cholecystitis. No significant biliary dilatation identified.   Original  Report Authenticated By: Carey Bullocks, M.D.     Medications: . ampicillin-sulbactam (UNASYN) IV  3 g Intravenous Q6H  . carvedilol  6.25 mg Oral BID WC  . heparin  5,000 Units Subcutaneous Q8H  . ketorolac  15-30 mg Intravenous Q6H  . letrozole  2.5 mg Oral QPM  . lip balm  1 application Topical BID  . losartan  25 mg Oral Daily  . Potassium Citrate-Citric Acid  1 packet Oral Daily  . spironolactone  25 mg Oral QAC breakfast    Assessment/Plan RUQ pain, with positive HIDA scan Breat cancer with Metastasis to liver, kidney and bone;  with left gastric and hepatic branch embolization Chemotherapy, and radiation therapy; s/p Y90 SIR Spheres Hx of CHF on Chemotherapy    PLan:  Dr. Dwain Sarna plans to do surgery later today.   LOS: 2 days    Kathy Jimenez 05/29/2012

## 2012-05-29 NOTE — Anesthesia Preprocedure Evaluation (Signed)
Anesthesia Evaluation  Patient identified by MRN, date of birth, ID band Patient awake    Reviewed: Allergy & Precautions, H&P , NPO status , Patient's Chart, lab work & pertinent test results  History of Anesthesia Complications (+) PONV  Airway Mallampati: II TM Distance: >3 FB Neck ROM: full    Dental no notable dental hx. (+) Teeth Intact and Dental Advisory Given   Pulmonary neg pulmonary ROS,  breath sounds clear to auscultation  Pulmonary exam normal       Cardiovascular Exercise Tolerance: Good +CHF Rhythm:regular Rate:Normal  Had heart damage from the chemo but it is much better now.   Neuro/Psych negative neurological ROS  negative psych ROS   GI/Hepatic negative GI ROS, Neg liver ROS,   Endo/Other  negative endocrine ROS  Renal/GU negative Renal ROS  negative genitourinary   Musculoskeletal   Abdominal   Peds  Hematology negative hematology ROS (+)   Anesthesia Other Findings Stage 4 metastatic breast Ca  Reproductive/Obstetrics negative OB ROS                           Anesthesia Physical Anesthesia Plan  ASA: III  Anesthesia Plan: General   Post-op Pain Management:    Induction: Intravenous  Airway Management Planned: Oral ETT  Additional Equipment:   Intra-op Plan:   Post-operative Plan: Extubation in OR  Informed Consent: I have reviewed the patients History and Physical, chart, labs and discussed the procedure including the risks, benefits and alternatives for the proposed anesthesia with the patient or authorized representative who has indicated his/her understanding and acceptance.   Dental Advisory Given  Plan Discussed with: CRNA and Surgeon  Anesthesia Plan Comments:         Anesthesia Quick Evaluation

## 2012-05-29 NOTE — Op Note (Signed)
Preoperative diagnoses: #1 stage IV breast cancer #2 cholecystitis Postoperative diagnosis: Same as above Procedure: Diagnostic laparoscopy, laparoscopic cholecystectomy Surgeon: Dr. Harden Mo Asst.: Dr. Romie Levee Anesthesia: Gen. Specimens: Gallbladder to pathology Complications: None Drains: None Estimated blood loss: Minimal Sponge and needle count correct at end of operation Disposition to recovery stable  Indications: This is a 42 year old female who has stage IV breast cancer. Her disease burden is very heavy in her liver he recently has undergone Y90 treatments to her left hepatic lobe. She  now appears to have developed cholecystitis both clinically and radiologically. She and I had a long discussion about options and decided to proceed with a laparoscopic cholecystectomy understanding the risks and benefits of this  Procedure: After informed consent was obtained the patient was taken to the operating room. She was on Unasyn on the floor. Sequential compression devices were on her legs. She was  placed under general endotracheal anesthesia without complication. Her abdomen was prepped and draped in the standard sterile surgical fashion. A surgical timeout was performed.  I infiltrated quarter percent Marcaine below the umbilicus. I made a vertical incision and grasped her fascia. I entered her fascia sharply and the peritoneum bluntly. I then placed a 0 Vicryl pursestring suture. I then placed a Hassan trocar and insufflated the abdomen to 15 mm mercury pressure I first viewed her entire abdomen with the laparoscope. She had a very atretic left hepatic lobe. The right hepatic lobe was nearly replaced with tumor. The entire surface of the liver was studded with over 1 cm metastases. Pictures were taken and should be in the chart. The gallbladder had omentum adherent to it. I then in through the 5 mm epigastric trocar. I then removed the omentum to the gallbladder. The gallbladder  clearly looked abnormal and had a very thick wall to as well. I then inserted 2 further 5 mm trocars in the right upper quadrant under direct vision without complication. The gallbladder was retracted cephalad. I took some time and was eventually able to obtain the critical view of safety. I was going to do a cholangiogram but decided against it is a appears that her gallbladder was very close to her common bile duct and that there was not much of the cystic duct at all. I was sure of my anatomy at this point so I clipped the arterial branches and divided them. One of these was very anterior. I dissected this all the way up onto the surface of the gallbladder it was clearly an artery. Then I divided the duct after placing clips. I then removed the gallbladder from the liver bed without any real difficulty. This was cholecystitis and had the plane consistent with that. I then placed this in an Endo Catch bag and removed from the abdomen. I then obtained hemostasis. I irrigated copiously. I then viewed the entire abdomen again did not see any other evidence of any metastatic disease outside of the liver. I then removed the [facility] trocar and tied my pursestring down. This completely obliterated the defect. I then removed the right remaining trocars and desufflated the abdomen. I closed these with 4 Monocryl. I placed Dermabond and Steri-Strips. She tolerated this well was extubated and transferred to recovery stable.

## 2012-05-29 NOTE — Preoperative (Signed)
Beta Blockers   Reason not to administer Beta Blockers:Not Applicable 

## 2012-05-30 ENCOUNTER — Encounter (HOSPITAL_COMMUNITY): Payer: Self-pay | Admitting: General Surgery

## 2012-05-30 ENCOUNTER — Telehealth (INDEPENDENT_AMBULATORY_CARE_PROVIDER_SITE_OTHER): Payer: Self-pay

## 2012-05-30 ENCOUNTER — Other Ambulatory Visit: Payer: Self-pay | Admitting: Oncology

## 2012-05-30 DIAGNOSIS — C50919 Malignant neoplasm of unspecified site of unspecified female breast: Secondary | ICD-10-CM

## 2012-05-30 MED ORDER — HEPARIN SOD (PORK) LOCK FLUSH 100 UNIT/ML IV SOLN
500.0000 [IU] | INTRAVENOUS | Status: AC | PRN
  Administered 2012-05-30: 500 [IU]

## 2012-05-30 NOTE — Discharge Summary (Signed)
Physician Discharge Summary  Patient ID: Kathy Jimenez MRN: [id number] DOB/AGE: 1970-04-13 42 y.o.  Admit date: 05/27/2012 Discharge date: 05/30/2012  Admission Diagnoses: Stage IV breast cancer Acute cholecystitis  Discharge Diagnoses:  Principal Problem:   Acute calculous cholecystitis Active Problems:   Chronic systolic heart failure   Kidney stones   Breast cancer metastasized to multiple sites   Discharged Condition: good  Hospital Course: 51 yof with metastatic breast cancer with heavy disease burden in liver s/p Y90 treatment recently to her left hepatic lobe.  She came in with ruq pain.  Underwent evaluation that showed u/s with small stones, thick gb wall.  A HIDA scan was also obtained and showed a nonfilling gallbladder with patent biliary tree.  I discussed with her going to or for lap chole.  She underwent uneventful lap chole for what appeared to be acute cholecystitis.  Her right lobe did have numerous nodules consistent with metastatic disease.  She was doing well the following day and will be discharged home.  Consults: cardiology  Significant Diagnostic Studies: radiology: u/s and HIDA scan  Treatments: surgery: laparoscopic cholecystectomy    Disposition: 01-Home or Self Care   Future Appointments Provider Department Dept Phone   07/02/2012 8:00 AM [facility] [phone]       Medication List    TAKE these medications       carvedilol 6.25 MG tablet  Commonly known as:  COREG  Take 6.25 mg by mouth 2 (two) times daily with a meal.     Cytra K Crystals 3300-1002 MG Pack  Generic drug:  Potassium Citrate-Citric Acid  Take 1 packet by mouth daily. Mix in 6 ounces of water or juice.     letrozole 2.5 MG tablet  Commonly known as:  FEMARA  Take 2.5 mg by mouth every evening.     lidocaine-prilocaine cream  Commonly known as:  EMLA  Apply 1 application topically daily as needed. For port-a-cath.     lisinopril 10 MG tablet  Commonly known as:  PRINIVIL,ZESTRIL  Take 10 mg by mouth 2 (two) times daily.     spironolactone 25 MG tablet  Commonly known as:  ALDACTONE  Take 25 mg by mouth every morning.           Follow-up Information   Follow up with Emelia Loron, MD.   Contact information:   [address] [place] [address] [phone]       Signed: Emelia Loron 05/30/2012, 9:42 AM

## 2012-05-30 NOTE — Progress Notes (Signed)
1 Day Post-Op  Subjective: No complaints, tol po, ready to go home, discussed operative findings  Objective: Vital signs in last 24 hours: Temp:  [97.7 F (36.5 C)-99 F (37.2 C)] 97.7 F (36.5 C) (02/20 0643) Pulse Rate:  [60-83] 60 (02/20 0643) Resp:  [14-18] 18 (02/20 0643) BP: (103-126)/(65-79) 121/79 mmHg (02/20 0643) SpO2:  [96 %-100 %] 98 % (02/20 0643) Last BM Date: 05/27/12  Intake/Output from previous day: 02/19 0701 - 02/20 0700 In: 2818.8 [I.V.:2118.8; IV Piggyback:700] Out: 550 [Urine:500; Blood:50] Intake/Output this shift:    General appearance: no distress GI: approp tender, wounds clean  Lab Results:   Recent Labs  05/27/12 1830 05/28/12 0525  WBC 6.8 3.7*  HGB 11.2* 9.8*  HCT 33.0* 28.8*  PLT 222 141*   BMET  Recent Labs  05/27/12 1830 05/28/12 0525  NA 135 139  K 3.7 3.4*  CL 98 103  CO2 23 27  GLUCOSE 81 92  BUN 14 11  CREATININE 0.82 0.76  CALCIUM 9.0 8.2*   PT/INR No results found for this basename: LABPROT, INR,  in the last 72 hours ABG No results found for this basename: PHART, PCO2, PO2, HCO3,  in the last 72 hours  Studies/Results: Nm Hepatobiliary Liver Func  05/28/2012  *RADIOLOGY REPORT*  Clinical Data:  42 year old female with metastatic breast cancer. Status post Y 90 micro sphere treatment of liver metastases January 2014.  Right upper quadrant pain and nausea.  Abnormal gallbladder on recent ultrasound.  NUCLEAR MEDICINE HEPATOBILIARY IMAGING  Technique:  Sequential images of the abdomen were obtained out to 60 minutes following intravenous administration of radiopharmaceutical.  Radiopharmaceutical:  5.76mCi Tc-62m Choletec  Comparison:  Abdominal ultrasound 05/27/2012.  Chest CT 11/10/2015 14.  Findings: Liver uptake of the radiotracer is mildly heterogeneous, the blood pool clears relatively quickly.  There is curvilinear activity at the level of the diaphragm, present on the first image and unchanged throughout the study.  Legrand Rams this reflects a non target embolization related to the January Y 90 therapy.  There is prompt activity in the CBD and small bowel.  Small bowel activity increases, but after 2 hours of imaging no convincing gallbladder activity is identified.  IMPRESSION: No gallbladder filling.  CBD is patent.  Extra-hepatic activity which is unchanged throughout the study probably reflects diaphragm activity from non-target Y90 embolization in January.  Study reviewed with Dr. Irish Lack.   Original Report Authenticated By: Erskine Speed, M.D.     Anti-infectives: Anti-infectives   Start     Dose/Rate Route Frequency Ordered Stop   05/27/12 2359  Ampicillin-Sulbactam (UNASYN) 3 g in sodium chloride 0.9 % 100 mL IVPB     3 g 100 mL/hr over 60 Minutes Intravenous Every 6 hours 05/27/12 2327        Assessment/Plan: S/p lap chole  Doing well, plan dc today  [facility] 05/30/2012

## 2012-05-30 NOTE — Telephone Encounter (Signed)
LMOM for pt to call me so I can give her a f/u appt with Dr Dwain Sarna for 06/21/12 arrive at 11:15/11:30.

## 2012-06-03 ENCOUNTER — Encounter (INDEPENDENT_AMBULATORY_CARE_PROVIDER_SITE_OTHER): Payer: Self-pay

## 2012-06-04 ENCOUNTER — Ambulatory Visit: Payer: Self-pay | Admitting: Oncology

## 2012-06-04 ENCOUNTER — Other Ambulatory Visit: Payer: Self-pay | Admitting: Lab

## 2012-06-07 ENCOUNTER — Ambulatory Visit: Payer: Self-pay

## 2012-06-07 ENCOUNTER — Ambulatory Visit (HOSPITAL_BASED_OUTPATIENT_CLINIC_OR_DEPARTMENT_OTHER): Payer: BC Managed Care – PPO | Admitting: Oncology

## 2012-06-07 ENCOUNTER — Other Ambulatory Visit (HOSPITAL_BASED_OUTPATIENT_CLINIC_OR_DEPARTMENT_OTHER): Payer: BC Managed Care – PPO | Admitting: Lab

## 2012-06-07 ENCOUNTER — Telehealth: Payer: Self-pay | Admitting: Oncology

## 2012-06-07 VITALS — BP 107/74 | HR 96 | Temp 98.5°F | Resp 20 | Ht 64.0 in | Wt 137.9 lb

## 2012-06-07 DIAGNOSIS — C50919 Malignant neoplasm of unspecified site of unspecified female breast: Secondary | ICD-10-CM

## 2012-06-07 LAB — COMPREHENSIVE METABOLIC PANEL (CC13)
ALT: 85 U/L — ABNORMAL HIGH (ref 0–55)
AST: 171 U/L — ABNORMAL HIGH (ref 5–34)
Albumin: 2.8 g/dL — ABNORMAL LOW (ref 3.5–5.0)
Calcium: 9.4 mg/dL (ref 8.4–10.4)
Chloride: 104 mEq/L (ref 98–107)
Potassium: 3.8 mEq/L (ref 3.5–5.1)
Total Protein: 6.8 g/dL (ref 6.4–8.3)

## 2012-06-07 LAB — CBC WITH DIFFERENTIAL/PLATELET
BASO%: 0.7 % (ref 0.0–2.0)
EOS%: 2 % (ref 0.0–7.0)
HGB: 11.7 g/dL (ref 11.6–15.9)
MCH: 30.2 pg (ref 25.1–34.0)
MCHC: 33.3 g/dL (ref 31.5–36.0)
RDW: 14.5 % (ref 11.2–14.5)
WBC: 7.6 10*3/uL (ref 3.9–10.3)
lymph#: 0.8 10*3/uL — ABNORMAL LOW (ref 0.9–3.3)

## 2012-06-07 MED ORDER — SCOPOLAMINE 1 MG/3DAYS TD PT72: 1.0000 | MEDICATED_PATCH | TRANSDERMAL | Status: DC

## 2012-06-07 NOTE — Telephone Encounter (Signed)
gv pt appt schedule for March thru May. Pt aware [facility] will contact her w/mri appt. Per pt work phone number has been changed to priority number.

## 2012-06-07 NOTE — Progress Notes (Signed)
ID: Mack Hook   DOB: 09-25-1970  MR#: [id number]  WUJ#:[id number]  PCP: No primary provider on file. GYN: Pamelia Hoit NP SU: Cicero Duck OTHER MD: Arvilla Meres, Margaretmary Dys   HISTORY OF PRESENT ILLNESS: "Kathy Jimenez" underwent left lumpectomy and sentinel lymph node sampling for stage IA invasive ductal carcinoma, grade 3, in February of 2004. The tumor was "triple positive", and she was treated with FEC x6 chemotherapy, then radiation, then tamoxifen for 5 years. She subsequently participated in the [facility] steady and received lapatinib or placebo for 1 year.  Unfortunately in April of 2012 a rising CA 27-29 lead to restaging studies which showed recurrent disease involving bone and liver, as well as some lymph nodes. Liver biopsy confirmed metastatic breast cancer, again triple positive. The patient's complex subsequent treatment history is detailed below.  INTERVAL HISTORY: Kathy Jimenez returns today for followup of her breast cancer. She is establishing herself on my service today. Since her last visit here in addition to her Yttrium treatments she underwent cholecystectomy.  REVIEW OF SYSTEMS: She tolerated the Yttrium embolization generally well, but continued to have some problems with abdominal pain and vomiting, bleeding to further workup and cholecystectomy with benign pathology 05/29/2012. Her energy and has not yet fully recovered, her appetite is not good, she continues to have altered taste, her tongue feels strange, and her ears are stopped up. Nevertheless she is working 50-60 hours a week. She is planning a treatment to [facility] with her daughter this coming week. A detailed review of systems was otherwise noncontributory  PAST MEDICAL HISTORY: Past Medical History  Diagnosis Date  . Breast cancer 05/2002  . Kidney stones     recurrent since 1989  . Endometrial polyp   . PONV (postoperative nausea and vomiting)   . CHF (congestive heart failure)     from chemo treatment   . Breast cancer metastasized to multiple sites  April2012    Kidney, liver, bones (sternum, L1, left symphysis pubis)  . Urethral stricture     PAST SURGICAL HISTORY: Past Surgical History  Procedure Laterality Date  . Hysteroscopy w/ endometrial ablation  03/11/2009    resection of endometrial polyps, total endometrial resection  . Breast lumpectomy  2004  . Cystoscopy with ureteroscopy  03/24/2011    Procedure: CYSTOSCOPY WITH URETEROSCOPY;  Surgeon: Garnett Farm, MD;  Location: [facility];  Service: Urology;  Laterality: Right;  dilitation of ureteral stricture  . Cholecystectomy N/A 05/29/2012    Procedure: LAPAROSCOPIC CHOLECYSTECTOMY WITH INTRAOPERATIVE CHOLANGIOGRAM;  Surgeon: Emelia Loron, MD;  Location: [facility];  Service: General;  Laterality: N/A;  Dr. Clovis Pu will assist    FAMILY HISTORY No family history on file. The patient's parents, currently in their mid 61s, living [place]. The patient has one brother, in the [place]. She had no sisters. There is no history of breast or ovarian cancer in the family to her knowledge.  GYNECOLOGIC HISTORY: Menarche age 44, first live birth age 27. She stopped having periods with her chemotherapy in 2004. She never used hormone replacement. She never used birth control pills.  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 She is divorced, and tells me her former husband has not been in touch with her or her daughter since her daughter was 76 years old. The patient lives by herself, with no pets. Her daughter Kathy Jimenez is currently a freshman at [employer]. The patient attends the [employer] locally.   ADVANCED DIRECTIVES: In place. Kathy Jimenez made it very clear to me today (06/07/2012) that she is interested in quality, not quantity, and that she does not want extraordinary means of support or resuscitation. She has named her  brother Kathy Jimenez ([phone]) and her friend Kathy Jimenez ([phone]) as joint healthcare power of attorney.  HEALTH MAINTENANCE: History  Substance Use Topics  . Smoking status: Never Smoker   . Smokeless tobacco: Never Used  . Alcohol Use: 0.6 oz/week    1 Glasses of wine per week     Comment: rare     Colonoscopy:  PAP:  Bone density:  Lipid panel:  Allergies  Allergen Reactions  . Sulfa Antibiotics Hives    Current Outpatient Prescriptions  Medication Sig Dispense Refill  . carvedilol (COREG) 6.25 MG tablet Take 6.25 mg by mouth 2 (two) times daily with a meal.      . letrozole (FEMARA) 2.5 MG tablet Take 2.5 mg by mouth every evening.      . lidocaine-prilocaine (EMLA) cream Apply 1 application topically daily as needed. For port-a-cath.      . lisinopril (PRINIVIL,ZESTRIL) 10 MG tablet Take 10 mg by mouth 2 (two) times daily.      . Potassium Citrate-Citric Acid (CYTRA K CRYSTALS) 3300-1002 MG PACK Take 1 packet by mouth daily. Mix in 6 ounces of water or juice.      Marland Kitchen spironolactone (ALDACTONE) 25 MG tablet Take 25 mg by mouth every morning.       No current facility-administered medications for this visit.   Facility-Administered Medications Ordered in Other Visits  Medication Dose Route Frequency Provider Last Rate Last Dose  . sodium chloride 0.9 % injection 10 mL  10 mL Intracatheter PRN Pierce Crane, MD   10 mL at 03/22/12 1737    OBJECTIVE: Middle-aged white woman in no acute distress Filed Vitals:   06/07/12 1234  BP: 107/74  Pulse: 96  Temp: 98.5 F (36.9 C)  Resp: 20     Body mass index is 23.66 kg/(m^2).    ECOG FS: 1  Sclerae unicteric Oropharynx clear No cervical or supraclavicular adenopathy Lungs no rales or rhonchi Heart regular rate and rhythm Abd soft, no right upper quadrant tenderness, positives bowel sounds, no masses palpated MSK no focal spinal tenderness, no peripheral edema Neuro: nonfocal, well oriented, positive  affect Breasts: The right breast is unremarkable. The left breast is status post lumpectomy and radiation. There is no evidence of local recurrence. The left axilla is benign.   LAB RESULTS: Lab Results  Component Value Date   WBC 7.6 06/07/2012   NEUTROABS 5.6 06/07/2012   HGB 11.7 06/07/2012   HCT 35.2 06/07/2012   MCV 90.5 06/07/2012   PLT 225 06/07/2012      Chemistry      Component Value Date/Time   NA 139 05/28/2012 0525   NA 139 04/11/2012 0815   NA 141 09/02/2010 1134   K 3.4* 05/28/2012 0525   K 4.7 04/11/2012 0815   K 4.3 09/02/2010 1134   CL 103 05/28/2012 0525   CL 106 04/11/2012 0815   CL 103 09/02/2010 1134   CO2 27 05/28/2012 0525   CO2 24 04/11/2012 0815   CO2 25 09/02/2010 1134   BUN 11 05/28/2012 0525   BUN 18.0 04/11/2012 0815   BUN 11 09/02/2010 1134   CREATININE 0.76 05/28/2012 0525   CREATININE 0.8 04/11/2012 0815   CREATININE 0.7 09/02/2010 1134  Component Value Date/Time   CALCIUM 8.2* 05/28/2012 0525   CALCIUM 9.3 04/11/2012 0815   CALCIUM 8.4 09/02/2010 1134   ALKPHOS 285* 05/28/2012 0525   ALKPHOS 74 04/11/2012 0815   ALKPHOS 84 09/02/2010 1134   AST 87* 05/28/2012 0525   AST 45* 04/11/2012 0815   AST 27 09/02/2010 1134   ALT 53* 05/28/2012 0525   ALT 40 04/11/2012 0815   BILITOT 0.4 05/28/2012 0525   BILITOT 0.65 04/11/2012 0815   BILITOT 0.60 09/02/2010 1134       Lab Results  Component Value Date   LABCA2 83* 04/11/2012    No components found with this basename: LABCA125    No results found for this basename: INR,  in the last 168 hours  Urinalysis    Component Value Date/Time   COLORURINE YELLOW 10/11/2010 1622   APPEARANCEUR CLEAR 10/11/2010 1622   LABSPEC 1.025 08/04/2011 1457   LABSPEC 1.045* 10/11/2010 1622   PHURINE 6.0 10/11/2010 1622   GLUCOSEU NEGATIVE 10/11/2010 1622   HGBUR LARGE* 10/11/2010 1622   BILIRUBINUR NEGATIVE 10/11/2010 1622   KETONESUR NEGATIVE 10/11/2010 1622   PROTEINUR NEGATIVE 10/11/2010 1622   UROBILINOGEN 0.2 10/11/2010 1622   NITRITE NEGATIVE  10/11/2010 1622   LEUKOCYTESUR NEGATIVE 10/11/2010 1622    STUDIES: Ct Angio Chest Pe W/cm &/or Wo Cm  05/27/2012  *RADIOLOGY REPORT*  Clinical Data: Right chest and back pain.  Metastatic breast carcinoma. Liver radioembolization 04/30/2012.  CT ANGIOGRAPHY CHEST  Technique:  Multidetector CT imaging of the chest using the standard protocol during bolus administration of intravenous contrast. Multiplanar reconstructed images including MIPs were obtained and reviewed to evaluate the vascular anatomy.  Contrast: 60mL OMNIPAQUE IOHEXOL 350 MG/ML SOLN  Comparison: PET CT 04/15/2012  Findings: There is good contrast opacification of the pulmonary artery branches.  No discrete filling defect to suggest acute PE.Adequate contrast opacification of the thoracic aorta with no evidence of dissection, aneurysm, or stenosis. There is classic 3- vessel brachiocephalic arch anatomy.  No pleural or pericardial effusion.  Right IJ port catheter extends to the proximal right atrium.  No hilar or mediastinal adenopathy.  Innumerable low attenuation liver lesions, much more extensive and apparent than on prior PET CT, possibly related to interval radioembolization versus significant progression of hepatic metastatic disease. Embolization coils noted in the upper abdomen.  Lungs are clear. Minimal dependent atelectasis posteriorly in the lower lobes. 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  Thoracic spine intact.  IMPRESSION: 1.  Negative for acute PE or thoracic aortic dissection. 2. Patchy nodular liver parenchymal changes may represent sequelae of recent radioembolization versus progression of hepatic metastatic disease.   Original Report Authenticated By: D. Andria Rhein, MD    Nm Hepatobiliary Liver Func  06/07/2012  **ADDENDUM** CREATED: 06/07/2012 12:18:53  The curvilinear activity projecting over the epigastric region is likely technetium within the port access tubing.  This is not  Y 90 activity as exam was performed  28 days post Y-90  therapy (greater than 10 half lives).  **END ADDENDUM** SIGNED BY: Genevive Bi, M.D.   05/28/2012  *RADIOLOGY REPORT*  Clinical Data:  42 year old female with metastatic breast cancer. Status post Y 90 micro sphere treatment of liver metastases January 2014.  Right upper quadrant pain and nausea.  Abnormal gallbladder on recent ultrasound.  NUCLEAR MEDICINE HEPATOBILIARY IMAGING  Technique:  Sequential images of the abdomen were obtained out to 60 minutes following intravenous administration of radiopharmaceutical.  Radiopharmaceutical:  5.61mCi Tc-13m Choletec  Comparison:  Abdominal ultrasound 05/27/2012.  Chest CT 11/10/2015 14.  Findings: Liver uptake of the radiotracer is mildly heterogeneous, the blood pool clears relatively quickly.  There is curvilinear activity at the level of the diaphragm, present on the first image and unchanged throughout the study. Legrand Rams this reflects a non target embolization related to the January Y 90 therapy.  There is prompt activity in the CBD and small bowel.  Small bowel activity increases, but after 2 hours of imaging no convincing gallbladder activity is identified.  IMPRESSION: No gallbladder filling.  CBD is patent.  Extra-hepatic activity which is unchanged throughout the study probably reflects diaphragm activity from non-target Y90 embolization in January.  Study reviewed with Dr. Irish Lack.  Original Report Authenticated By: Erskine Speed, M.D.    US Abdomen Complete  05/27/2012  *RADIOLOGY REPORT*  Clinical Data:  Nausea and right upper quadrant abdominal pain. Metastatic breast cancer.  COMPLETE ABDOMINAL ULTRASOUND  Comparison:  Chest CTA 05/27/2012.  PET CT 04/15/2012.  Findings:  Gallbladder: Contains multiple small shadowing gallstones.  There is gallbladder wall thickening to 3.7 mm.  Sonographer reports a positive sonographic Murphy's sign.  Common bile duct:   Not well visualized, although without evidence of significant  dilatation.  Liver:  The liver is diffusely replaced by widespread metastatic disease as demonstrated on the earlier CT.  There are probable nodal metastases within the porta caval as well.  IVC:  Visualized portions appear unremarkable.  Pancreas:  Visualized portions appear unremarkable.  Spleen:  Visualized portions appear unremarkable.  Right Kidney:   The renal cortical thickness and echogenicity are preserved.  There is no hydronephrosis or focal abnormality. Renal length is 10.7 cm.  Left Kidney:   The renal cortical thickness and echogenicity are preserved.  There is no hydronephrosis or focal abnormality. Renal length is 10.2 cm.  Abdominal aorta:  Visualized portions appear unremarkable.  IMPRESSION:  1.  Widespread hepatic metastatic disease. 2.  Gallstones and gallbladder wall thickening with positive sonographic Murphy's sign.  Findings suspicious for cholecystitis. No significant biliary dilatation identified.   Original Report Authenticated By: Carey Bullocks, M.D.     ASSESSMENT: 42 y.o. BRCA negative [place] woman with stage IV breast cancer involving liver and bone  (1) status post left lumpectomy and sentinel lymph node sampling 06/06/2002 for a pT1c pN0, stage IA invasive ductal carcinoma, high-grade, estrogen receptor and 96% and progesterone receptor 96% positive, with an MIB-1 of 32%, and HercepTest 3+ positive.  (2) status post adjuvant cyclophosphamide, epirubicin and fluorouracil x6  (3) completed adjuvant radiation to the left breast 01/01/2003  (4) completed 5 years of tamoxifen October of 2009  (5) participated in [facility] study, receiving either lapatinib or placebo from 08/02/2005 to 04/19/2006  (6) elevated CEA 125 lid 2 studies April of 2012 documenting spread to bone, liver, and lymph nodes. Liver biopsy 08/05/2010 confirmed metastatic adenocarcinoma which was estrogen receptor positive at 75%, progesterone receptor positive at 12%, with an MIB-1 of 41% and HER-2/neu  positive by [facility] with a ratio of 4.11  (7) in the metastatic setting the patient has been treated as follows   (a) goserelin started 08/03/2010; most recent dose 04/26/2012  (b) denosumab started 08/03/2010; most recent dose 05/29/2012  (c) lapatinib between 08/11/2010 and 06/16/2011  (d) trastuzumab between 08/12/2010 and February 2013  (e) pertuzumab single-dose noted 01/13/2011  (f) capecitabine 03/28/2011 to 07/14/2011  (g) fulvestrant between 12/23/2010 and 10/27/2011  (h) TDM-1 06/23/2011 to 10/27/2011  (i) cyclophosphamide, methotrexate and fluorouracil x4,  completed October 2013  (j) Abraxane x5 between November and December 2013  (k) Y-90 radioembolization of left hepatic artery 04/30/2012  PLAN: It has been difficult to exactly reconstruct Clay's prior treatments, and in addition to the hour-long visit we had I have spent an additional hour going over her records. Some of the treatments noted above overlapped. In addition some other treatments were discontinued because of a rise in her CEA 27-29, not always confirmed by radiology, and not because of intolerance of the treatments.  We went over the fact that the NCCN recommends we do not follow the CA 27-29, since this test is unreliable. In addition, repeat liver biopsy October 2012 had shown a HER-2 ratio by [facility] of 2.12. This led to discontinuation of anti-HER-2 treatment. We discussed the fact that at the time that result was considered equivocal, but with the new guidelines by ASCO any result above 2.0 is positive. In general I follow the rule that "once HER-2 positive, always HER-2 positive", and therefore my recommendation is that what ever we do we continue of anti-HER-2 treatment  Kathy Jimenez told me that she hates to lose her hair and she hates to be in the emergency room, and she hates hospitals. Quality is more important than quantity of life to her. She had met with my partner Dr. Welton Flakes, who suggested letrozole, and that certainly  is reasonable. My preference however would be to go back to TDM-1, which combines anti-HER-2 and chemotherapy in a single drug. She tolerated this well when she received it in the past, and it was discontinued because of concerns regarding tumor marker rise, even though CT scan on July of 2013 showed improvement in her liver metastases.  Accordingly she will receive TDM-1 when she returns from her [place] vacation. I am going to change her goserelin and denosumab to every 6 weeks to minimize the number of times she needs to come here. I am obtaining a baseline MRI of the liver and this is what we will follow every 3 months. Note that echocardiogram 03/14/2012 showed an excellent ejection fraction.  Kathy Jimenez understands this plan and is very much in agreement with it. Treatments will start 06/21/2012 and she will see me in April to make sure she is tolerating it well. She knows to call for any problems that may develop before the next visit.   MAGRINAT,GUSTAV C    06/07/2012

## 2012-06-15 ENCOUNTER — Emergency Department (HOSPITAL_COMMUNITY): Payer: BC Managed Care – PPO

## 2012-06-15 ENCOUNTER — Inpatient Hospital Stay (HOSPITAL_COMMUNITY)
Admission: EM | Admit: 2012-06-15 | Discharge: 2012-06-18 | DRG: 566 | Disposition: A | Discharge status: Home / Self Care | Payer: BC Managed Care – PPO | Attending: Internal Medicine | Admitting: Internal Medicine

## 2012-06-15 ENCOUNTER — Encounter (HOSPITAL_COMMUNITY): Payer: Self-pay

## 2012-06-15 DIAGNOSIS — R630 Anorexia: Secondary | ICD-10-CM | POA: Diagnosis present

## 2012-06-15 DIAGNOSIS — R5381 Other malaise: Secondary | ICD-10-CM | POA: Diagnosis present

## 2012-06-15 DIAGNOSIS — R110 Nausea: Secondary | ICD-10-CM | POA: Diagnosis present

## 2012-06-15 DIAGNOSIS — C801 Malignant (primary) neoplasm, unspecified: Secondary | ICD-10-CM

## 2012-06-15 DIAGNOSIS — I5022 Chronic systolic (congestive) heart failure: Secondary | ICD-10-CM | POA: Diagnosis present

## 2012-06-15 DIAGNOSIS — R7401 Elevation of levels of liver transaminase levels: Secondary | ICD-10-CM | POA: Diagnosis present

## 2012-06-15 DIAGNOSIS — I959 Hypotension, unspecified: Secondary | ICD-10-CM | POA: Diagnosis present

## 2012-06-15 DIAGNOSIS — E860 Dehydration: Secondary | ICD-10-CM | POA: Diagnosis present

## 2012-06-15 DIAGNOSIS — I428 Other cardiomyopathies: Secondary | ICD-10-CM | POA: Diagnosis present

## 2012-06-15 DIAGNOSIS — C50919 Malignant neoplasm of unspecified site of unspecified female breast: Secondary | ICD-10-CM | POA: Diagnosis present

## 2012-06-15 DIAGNOSIS — I509 Heart failure, unspecified: Secondary | ICD-10-CM | POA: Diagnosis present

## 2012-06-15 DIAGNOSIS — Z853 Personal history of malignant neoplasm of breast: Secondary | ICD-10-CM

## 2012-06-15 DIAGNOSIS — Z79899 Other long term (current) drug therapy: Secondary | ICD-10-CM

## 2012-06-15 DIAGNOSIS — C787 Secondary malignant neoplasm of liver and intrahepatic bile duct: Secondary | ICD-10-CM | POA: Diagnosis present

## 2012-06-15 DIAGNOSIS — C7900 Secondary malignant neoplasm of unspecified kidney and renal pelvis: Secondary | ICD-10-CM | POA: Diagnosis present

## 2012-06-15 DIAGNOSIS — E876 Hypokalemia: Secondary | ICD-10-CM

## 2012-06-15 DIAGNOSIS — R109 Unspecified abdominal pain: Secondary | ICD-10-CM | POA: Diagnosis present

## 2012-06-15 DIAGNOSIS — C7951 Secondary malignant neoplasm of bone: Secondary | ICD-10-CM | POA: Diagnosis present

## 2012-06-15 LAB — COMPREHENSIVE METABOLIC PANEL
Albumin: 3 g/dL — ABNORMAL LOW (ref 3.5–5.2)
Alkaline Phosphatase: 826 U/L — ABNORMAL HIGH (ref 39–117)
BUN: 25 mg/dL — ABNORMAL HIGH (ref 6–23)
Potassium: 4.2 mEq/L (ref 3.5–5.1)
Total Protein: 7.3 g/dL (ref 6.0–8.3)

## 2012-06-15 LAB — TROPONIN I: Troponin I: <0.3 ng/mL (ref <0.30)

## 2012-06-15 LAB — CBC WITH DIFFERENTIAL/PLATELET
Basophils Relative: 0 % (ref 0–1)
Eosinophils Absolute: 0.1 10*3/uL (ref 0.0–0.7)
MCH: 30.7 pg (ref 26.0–34.0)
MCHC: 33.3 g/dL (ref 30.0–36.0)
Monocytes Relative: 13 % — ABNORMAL HIGH (ref 3–12)
Neutrophils Relative %: 70 % (ref 43–77)
Platelets: 204 10*3/uL (ref 150–400)

## 2012-06-15 LAB — PRO B NATRIURETIC PEPTIDE: Pro B Natriuretic peptide (BNP): 202.8 pg/mL — ABNORMAL HIGH (ref 0–125)

## 2012-06-15 LAB — PROTIME-INR
INR: 1.06 (ref 0.00–1.49)
Prothrombin Time: 13.7 seconds (ref 11.6–15.2)

## 2012-06-15 LAB — APTT: aPTT: 33 seconds (ref 24–37)

## 2012-06-15 MED ORDER — SODIUM CHLORIDE 0.9 % IV BOLUS (SEPSIS)
1000.0000 mL | Freq: Once | INTRAVENOUS | Status: AC
  Administered 2012-06-15: 1000 mL via INTRAVENOUS

## 2012-06-15 MED ORDER — ONDANSETRON HCL 4 MG/2ML IJ SOLN
4.0000 mg | Freq: Once | INTRAMUSCULAR | Status: AC
  Administered 2012-06-15: 4 mg via INTRAVENOUS
  Filled 2012-06-15: qty 2

## 2012-06-15 MED ORDER — IOHEXOL 350 MG/ML SOLN
100.0000 mL | Freq: Once | INTRAVENOUS | Status: AC | PRN
  Administered 2012-06-15: 100 mL via INTRAVENOUS

## 2012-06-15 MED ORDER — SODIUM CHLORIDE 0.9 % IV SOLN: Freq: Once | INTRAVENOUS | Status: DC

## 2012-06-15 MED ORDER — LORAZEPAM 2 MG/ML IJ SOLN
1.0000 mg | Freq: Once | INTRAMUSCULAR | Status: AC
Start: 2012-06-15 — End: 2012-06-15
  Administered 2012-06-15: 1 mg via INTRAVENOUS
  Filled 2012-06-15: qty 1

## 2012-06-15 NOTE — ED Notes (Addendum)
Dr Gala Romney no longer on call

## 2012-06-15 NOTE — ED Notes (Addendum)
Please page Dr Gala Romney with lab results

## 2012-06-15 NOTE — ED Notes (Signed)
Pt with stage 4 metastatic breast cancer.  Pt went on vacation Sunday and has felt excessively weak since then.  Pt also c/o spine pain, abdominal pain (pt states liver enlarged), and headache.  Pt is pt of Dr. Milas Kocher and he has verbally given orders for her.

## 2012-06-15 NOTE — ED Provider Notes (Signed)
History     CSN: [id number]  Arrival date & time 06/15/12  9562   First MD Initiated Contact with Patient 06/15/12 2016      Chief Complaint  Patient presents with  . Weakness  . Abdominal Pain  . Headache  . Back Pain    (Consider location/radiation/quality/duration/timing/severity/associated sxs/prior treatment) HPI Comments: Patient has a history of metastatic breast cancer the history of the liver presents with generalized weakness, increasing spine pain, abdominal pain for the past 2 weeks. She just returned from vacation last week. She endorses dizziness, lightheadedness with standing. She had a cholecystectomy on February 17 and complains that her liver is still sore. She denies any fevers, chest pain or shortness of breath. She has had poor intake at home and poor appetite.  The history is provided by the patient and a relative.    Past Medical History  Diagnosis Date  . Breast cancer 05/2002  . Kidney stones     recurrent since 1989  . Endometrial polyp   . PONV (postoperative nausea and vomiting)   . CHF (congestive heart failure)     from chemo treatment  . Breast cancer metastasized to multiple sites  April2012    Kidney, liver, bones (sternum, L1, left symphysis pubis)  . Urethral stricture     Past Surgical History  Procedure Laterality Date  . Hysteroscopy w/ endometrial ablation  03/11/2009    resection of endometrial polyps, total endometrial resection  . Breast lumpectomy  2004  . Cystoscopy with ureteroscopy  03/24/2011    Procedure: CYSTOSCOPY WITH URETEROSCOPY;  Surgeon: Garnett Farm, MD;  Location: [facility];  Service: Urology;  Laterality: Right;  dilitation of ureteral stricture  . Cholecystectomy N/A 05/29/2012    Procedure: LAPAROSCOPIC CHOLECYSTECTOMY WITH INTRAOPERATIVE CHOLANGIOGRAM;  Surgeon: Emelia Loron, MD;  Location: [facility];  Service: General;  Laterality: N/A;  Dr. Clovis Pu will assist    Family History  Problem Relation Age of  Onset  . CAD      None  . Hypertension      None  . Diabetes      None  . Cancer      None    History  Substance Use Topics  . Smoking status: Never Smoker   . Smokeless tobacco: Never Used  . Alcohol Use: 0.6 oz/week    1 Glasses of wine per week     Comment: rare    OB History   Grav Para Term Preterm Abortions TAB SAB Ect Mult Living                  Review of Systems  Constitutional: Positive for activity change, appetite change and fatigue. Negative for fever.  HENT: Negative for congestion and rhinorrhea.   Respiratory: Negative for cough, chest tightness and shortness of breath.   Cardiovascular: Negative for chest pain.  Gastrointestinal: Positive for nausea, vomiting and abdominal pain. Negative for diarrhea.  Genitourinary: Negative for vaginal bleeding and vaginal discharge.  Musculoskeletal: Positive for myalgias, back pain and arthralgias.  Neurological: Positive for dizziness, weakness, light-headedness and headaches.   A complete 10 system review of systems was obtained and all systems are negative except as noted in the HPI and PMH.   Allergies  Other and Sulfa antibiotics  Home Medications   No current outpatient prescriptions on file.  BP 85/49  Pulse 95  Temp(Src) 97.8 F (36.6 C) (Oral)  Resp 16  Ht 5\' 5"  (1.651 m)  Wt 137 lb (  62.143 kg)  BMI 22.8 kg/m2  SpO2 100%  Physical Exam  Constitutional: She is oriented to person, place, and time. She appears well-developed and well-nourished. No distress.  HENT:  Head: Normocephalic and atraumatic.  Dry mucous membranes  Eyes: Conjunctivae and EOM are normal. Pupils are equal, round, and reactive to light.  Neck: Normal range of motion. Neck supple.  Cardiovascular: Normal rate, regular rhythm and normal heart sounds.   Pulmonary/Chest: Effort normal and breath sounds normal. No respiratory distress.  Abdominal: Soft. There is tenderness. There is no rebound and no guarding.  Tender to  palpation epigastric area and right upper quadrant. No pain at [facility] point. No peritoneal signs.  Musculoskeletal: Normal range of motion. She exhibits no edema and no tenderness.  Neurological: She is alert and oriented to person, place, and time. No cranial nerve deficit. She exhibits normal muscle tone. Coordination normal.  Skin: Skin is warm.    ED Course  Procedures (including critical care time)  Labs Reviewed  CBC WITH DIFFERENTIAL - Abnormal; Notable for the following:    Monocytes Relative 13 (*)    All other components within normal limits  COMPREHENSIVE METABOLIC PANEL - Abnormal; Notable for the following:    Glucose, Bld 128 (*)    BUN 25 (*)    Creatinine, Ser 1.34 (*)    Calcium 12.4 (*)    Albumin 3.0 (*)    AST 370 (*)    ALT 135 (*)    Alkaline Phosphatase 826 (*)    GFR calc non Af Amer 48 (*)    GFR calc Af Amer 55 (*)    All other components within normal limits  PRO B NATRIURETIC PEPTIDE - Abnormal; Notable for the following:    Pro B Natriuretic peptide (BNP) 202.8 (*)    All other components within normal limits  LACTIC ACID, PLASMA - Abnormal; Notable for the following:    Lactic Acid, Venous 3.7 (*)    All other components within normal limits  BASIC METABOLIC PANEL - Abnormal; Notable for the following:    Sodium 134 (*)    GFR calc non Af Amer 68 (*)    GFR calc Af Amer 79 (*)    All other components within normal limits  CBC - Abnormal; Notable for the following:    RBC 3.49 (*)    Hemoglobin 10.6 (*)    HCT 32.2 (*)    Platelets 123 (*)    All other components within normal limits  HEPATIC FUNCTION PANEL - Abnormal; Notable for the following:    Total Protein 5.3 (*)    Albumin 2.2 (*)    AST 297 (*)    ALT 105 (*)    Alkaline Phosphatase 613 (*)    Bilirubin, Direct 0.5 (*)    All other components within normal limits  CULTURE, BLOOD (ROUTINE X 2)  CULTURE, BLOOD (ROUTINE X 2)  PROTIME-INR  APTT  AMMONIA  TROPONIN I   URINALYSIS, ROUTINE W REFLEX MICROSCOPIC  LACTIC ACID, PLASMA   Ct Angio Chest Pe W/cm &/or Wo Cm  06/16/2012  *RADIOLOGY REPORT*  Clinical Data:  Shortness of breath and chest pain.  Weakness; right upper quadrant abdominal pain and back pain.  Blurred vision. History of metastatic breast cancer.  CT ANGIOGRAPHY CHEST CT ABDOMEN AND PELVIS WITH CONTRAST  Technique:  Multidetector CT imaging of the chest was performed using the standard protocol during bolus administration of intravenous contrast.  Multiplanar CT image reconstructions including  MIPs were obtained to evaluate the vascular anatomy. Multidetector CT imaging of the abdomen and pelvis was performed using the standard protocol during bolus administration of intravenous contrast.  Contrast: OMNIPAQUE IOHEXOL 350 MG/ML SOLN  Comparison:   CTA of the chest performed 05/27/2012, and PET / CT performed 04/15/2012.  CTA CHEST  Findings:  There is no evidence of significant pulmonary embolus.  The lungs are essentially clear bilaterally.  A tiny 4 mm nodule within the left upper lobe (image 38 of 83) appears grossly stable from prior studies.  There is no evidence of significant focal consolidation, pleural effusion or pneumothorax.  No masses are identified; no abnormal focal contrast enhancement is seen.  A mildly enlarged right hilar node is seen, measuring 1.2 cm in short axis.  This has increased mildly in size from prior studies. There is no evidence of mediastinal lymphadenopathy.  No pericardial effusion is identified.  The great vessels are grossly unremarkable in appearance.  No axillary lymphadenopathy is seen. The visualized portions of the thyroid gland are unremarkable in appearance.  Postoperative change is noted at the left breast.  A right-sided chest port is noted ending about the cavoatrial junction.  No acute osseous abnormalities are seen.   Review of the MIP images confirms the above findings.  IMPRESSION:  1.  No evidence of  significant pulmonary embolus. 2.  Lungs essentially clear bilaterally. 3.  Mildly enlarged right hilar node, measuring 1.2 cm in short axis; this has increased mildly in size from prior studies, and is nonspecific.  CT ABDOMEN AND PELVIS  Findings: Innumerable metastatic lesions are noted throughout the liver, significantly worsened from prior studies.  This is most prominent along the caudate lobe, where a 6.4 cm mass abuts the lesser curvature of the stomach.  The patient is status post resection of the left hepatic lobe.  The spleen is unremarkable in appearance.  The pancreas and adrenal glands are grossly unremarkable.  The patient is status post cholecystectomy, with clips noted at the gallbladder fossa.  There is slight asymmetric prominence of the right ureter along its entire course; this appears grossly stable from the prior PET / CT, and may reflect the patient's baseline.  Alternatively, mild ureteritis could have such an appearance.  A vague 1.6 cm linear focus of decreased attenuation at the interpole region of the right kidney is nonspecific but could reflect a small cyst or possibly minimal pyelonephritis.  It is not definitely characterized on the prior PET / CT.  A nonobstructing 3 mm stone is noted near the upper pole of the left kidney.  The kidneys are otherwise unremarkable in appearance.  The small bowel is unremarkable in appearance.  The stomach is within normal limits.  No acute vascular abnormalities are seen.  Note is made of a 1.2 cm omental mass anterior to the liver at the midline, apparently new from prior studies.  No additional omental metastases are identified.  Trace free fluid is noted about the inferior tip of the liver, tracking to the right paracolic gutter. This may correspond to underlying omental disease.  The appendix is normal in caliber, without evidence for appendicitis.  The colon is largely decompressed and is unremarkable in appearance, aside from two apparent small  diverticula along the sigmoid colon.  The bladder is mildly distended and grossly unremarkable.  A small amount of free fluid within the pelvic cul-de-sac is nonspecific in nature.  The ovaries are relatively symmetric.  No suspicious adnexal masses are seen.  The uterus is grossly unremarkable in appearance.  No inguinal lymphadenopathy is seen.  No acute osseous abnormalities are identified.  A 1.4 cm sclerotic focus within vertebral body L1 and sclerotic foci within the sacrum, are stable from 2013 and may reflect treated metastases. There is stable extensive sclerotic expansion of the sternum and manubrium.  Review of the MIP images confirms the above findings.  IMPRESSION:  1.  Worsening innumerable metastases noted throughout the liver; a 6.4 cm caudate lobe mass is seen abutting the lesser curvature of the stomach.  2.  Vague linear focus of decreased attenuation at the interpole region of the right kidney is nonspecific and could reflect very mild pyelonephritis or possibly a cyst.  Slight asymmetric prominence of the right ureter along its entire course appears grossly stable from the prior PET / CT and may reflect the patient's baseline, though mild ureteritis could have such an appearance. 3.  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  Trace free fluid about the inferior tip of the liver, tracking to the right paracolic gutter, is also new and may reflect underlying omental disease. 4.  3 mm nonobstructing stone near the upper pole of the left kidney. 5.  Stable extensive sclerotic expansion of the sternum and manubrium, and sclerotic foci within the L1 and the sacrum, likely reflect treated disease.  No acute osseous abnormalities identified.   Original Report Authenticated By: Tonia Ghent, M.D.    Ct Abdomen Pelvis W Contrast  06/16/2012  *RADIOLOGY REPORT*  Clinical Data:  Shortness of breath and chest pain.  Weakness; right upper  quadrant abdominal pain and back pain.  Blurred vision. History of metastatic breast cancer.  CT ANGIOGRAPHY CHEST CT ABDOMEN AND PELVIS WITH CONTRAST  Technique:  Multidetector CT imaging of the chest was performed using the standard protocol during bolus administration of intravenous contrast.  Multiplanar CT image reconstructions including MIPs were obtained to evaluate the vascular anatomy. Multidetector CT imaging of the abdomen and pelvis was performed using the standard protocol during bolus administration of intravenous contrast.  Contrast: OMNIPAQUE IOHEXOL 350 MG/ML SOLN  Comparison:   CTA of the chest performed 05/27/2012, and PET / CT performed 04/15/2012.  CTA CHEST  Findings:  There is no evidence of significant pulmonary embolus.  The lungs are essentially clear bilaterally.  A tiny 4 mm nodule within the left upper lobe (image 38 of 83) appears grossly stable from prior studies.  There is no evidence of significant focal consolidation, pleural effusion or pneumothorax.  No masses are identified; no abnormal focal contrast enhancement is seen.  A mildly enlarged right hilar node is seen, measuring 1.2 cm in short axis.  This has increased mildly in size from prior studies. There is no evidence of mediastinal lymphadenopathy.  No pericardial effusion is identified.  The great vessels are grossly unremarkable in appearance.  No axillary lymphadenopathy is seen. The visualized portions of the thyroid gland are unremarkable in appearance.  Postoperative change is noted at the left breast.  A right-sided chest port is noted ending about the cavoatrial junction.  No acute osseous abnormalities are seen.   Review of the MIP images confirms the above findings.  IMPRESSION:  1.  No evidence of significant pulmonary embolus. 2.  Lungs essentially clear bilaterally. 3.  Mildly enlarged right hilar node, measuring 1.2 cm in short axis; this has increased mildly in size from prior studies, and is  nonspecific.  CT ABDOMEN AND PELVIS  Findings: Innumerable metastatic lesions are noted throughout the liver, significantly worsened from prior studies.  This is most prominent along the caudate lobe, where a 6.4 cm mass abuts the lesser curvature of the stomach.  The patient is status post resection of the left hepatic lobe.  The spleen is unremarkable in appearance.  The pancreas and adrenal glands are grossly unremarkable.  The patient is status post cholecystectomy, with clips noted at the gallbladder fossa.  There is slight asymmetric prominence of the right ureter along its entire course; this appears grossly stable from the prior PET / CT, and may reflect the patient's baseline.  Alternatively, mild ureteritis could have such an appearance.  A vague 1.6 cm linear focus of decreased attenuation at the interpole region of the right kidney is nonspecific but could reflect a small cyst or possibly minimal pyelonephritis.  It is not definitely characterized on the prior PET / CT.  A nonobstructing 3 mm stone is noted near the upper pole of the left kidney.  The kidneys are otherwise unremarkable in appearance.  The small bowel is unremarkable in appearance.  The stomach is within normal limits.  No acute vascular abnormalities are seen.  Note is made of a 1.2 cm omental mass anterior to the liver at the midline, apparently new from prior studies.  No additional omental metastases are identified.  Trace free fluid is noted about the inferior tip of the liver, tracking to the right paracolic gutter. This may correspond to underlying omental disease.  The appendix is normal in caliber, without evidence for appendicitis.  The colon is largely decompressed and is unremarkable in appearance, aside from two apparent small diverticula along the sigmoid colon.  The bladder is mildly distended and grossly unremarkable.  A small amount of free fluid within the pelvic cul-de-sac is nonspecific in nature.  The ovaries are  relatively symmetric.  No suspicious adnexal masses are seen.  The uterus is grossly unremarkable in appearance.  No inguinal lymphadenopathy is seen.  No acute osseous abnormalities are identified.  A 1.4 cm sclerotic focus within vertebral body L1 and sclerotic foci within the sacrum, are stable from 2013 and may reflect treated metastases. There is stable extensive sclerotic expansion of the sternum and manubrium.  Review of the MIP images confirms the above findings.  IMPRESSION:  1.  Worsening innumerable metastases noted throughout the liver; a 6.4 cm caudate lobe mass is seen abutting the lesser curvature of the stomach.  2.  Vague linear focus of decreased attenuation at the interpole region of the right kidney is nonspecific and could reflect very mild pyelonephritis or possibly a cyst.  Slight asymmetric prominence of the right ureter along its entire course appears grossly stable from the prior PET / CT and may reflect the patient's baseline, though mild ureteritis could have such an appearance. 3.  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  Trace free fluid about the inferior tip of the liver, tracking to the right paracolic gutter, is also new and may reflect underlying omental disease. 4.  3 mm nonobstructing stone near the upper pole of the left kidney. 5.  Stable extensive sclerotic expansion of the sternum and manubrium, and sclerotic foci within the L1 and the sacrum, likely reflect treated disease.  No acute osseous abnormalities identified.   Original Report Authenticated By: Tonia Ghent, M.D.      1. Hypercalcemia   2.  Metastatic cancer   3. Dehydration   4. Abdominal pain   5. Chronic systolic heart failure   6. Elevated transaminase level   7. Nausea       MDM  History of metastatic breast cancer presenting with nausea, increasing pain, generalized weakness and hypotension. 2 weeks postop cholecystectomy. No fevers.  Refer to the ED by Dr. Gala Romney.  Mild renal insufficiency with elevated lactate. Patient has significant abdominal tenderness on exam which is difficult to interpret in setting of her recent surgery as well as radioactive seeds. D/w Dr. Gala Romney. Patient does not appear to be in decompensated HF.  Agrees with medical admission if patient agreeable. Will obtain imaging to rule out postsurgical complication. CTPE negative.  No evidence of surgical complication in abdomen. Probable worsening  Metastasis.  BP improved to 100 systolic after fluids.  Patient agreeable to observation admission for treatment of dehydration and hypercalcemia.    Date: 06/15/2012  Rate: 85  Rhythm: normal sinus rhythm  QRS Axis: normal  Intervals: normal  ST/T Wave abnormalities: normal  Conduction Disutrbances:none  Narrative Interpretation:   Old EKG Reviewed: unchanged    Glynn Octave, MD 06/16/12 1252

## 2012-06-15 NOTE — ED Notes (Signed)
CT notified pt finished with contrast. 

## 2012-06-16 ENCOUNTER — Encounter (HOSPITAL_COMMUNITY): Payer: Self-pay | Admitting: Internal Medicine

## 2012-06-16 LAB — HEPATIC FUNCTION PANEL
ALT: 105 U/L — ABNORMAL HIGH (ref 0–35)
AST: 297 U/L — ABNORMAL HIGH (ref 0–37)
Albumin: 2.2 g/dL — ABNORMAL LOW (ref 3.5–5.2)
Alkaline Phosphatase: 613 U/L — ABNORMAL HIGH (ref 39–117)
Total Protein: 5.3 g/dL — ABNORMAL LOW (ref 6.0–8.3)

## 2012-06-16 LAB — CBC
Hemoglobin: 10.6 g/dL — ABNORMAL LOW (ref 12.0–15.0)
MCHC: 32.9 g/dL (ref 30.0–36.0)
RBC: 3.49 MIL/uL — ABNORMAL LOW (ref 3.87–5.11)
WBC: 4.5 10*3/uL (ref 4.0–10.5)

## 2012-06-16 LAB — BASIC METABOLIC PANEL
GFR calc Af Amer: 79 mL/min — ABNORMAL LOW (ref >90)
GFR calc non Af Amer: 68 mL/min — ABNORMAL LOW (ref >90)
Potassium: 3.8 mEq/L (ref 3.5–5.1)
Sodium: 134 mEq/L — ABNORMAL LOW (ref 135–145)

## 2012-06-16 LAB — LIPASE, BLOOD: Lipase: 20 U/L (ref 11–59)

## 2012-06-16 MED ORDER — SODIUM CHLORIDE 0.9 % IV SOLN
30.0000 mg | Freq: Once | INTRAVENOUS | Status: AC
  Administered 2012-06-16: 30 mg via INTRAVENOUS
  Filled 2012-06-16: qty 10

## 2012-06-16 MED ORDER — ONDANSETRON HCL 4 MG/2ML IJ SOLN: 4.0000 mg | Freq: Four times a day (QID) | INTRAMUSCULAR | Status: DC | PRN

## 2012-06-16 MED ORDER — SODIUM CHLORIDE 0.9 % IV SOLN: Freq: Once | INTRAVENOUS | Status: DC

## 2012-06-16 MED ORDER — HYDROMORPHONE HCL PF 1 MG/ML IJ SOLN: 0.5000 mg | INTRAMUSCULAR | Status: DC | PRN

## 2012-06-16 MED ORDER — ALUM & MAG HYDROXIDE-SIMETH 200-200-20 MG/5ML PO SUSP: 30.0000 mL | Freq: Four times a day (QID) | ORAL | Status: DC | PRN

## 2012-06-16 MED ORDER — OXYCODONE HCL 5 MG PO TABS
5.0000 mg | ORAL_TABLET | ORAL | Status: DC | PRN
  Administered 2012-06-16: 5 mg via ORAL
  Filled 2012-06-16: qty 1

## 2012-06-16 MED ORDER — ACETAMINOPHEN 325 MG PO TABS: 325.0000 mg | ORAL_TABLET | Freq: Four times a day (QID) | ORAL | Status: DC | PRN

## 2012-06-16 MED ORDER — ZOLPIDEM TARTRATE 5 MG PO TABS
5.0000 mg | ORAL_TABLET | Freq: Every evening | ORAL | Status: DC | PRN
  Administered 2012-06-16: 5 mg via ORAL
  Filled 2012-06-16: qty 1

## 2012-06-16 MED ORDER — HYDROMORPHONE HCL PF 1 MG/ML IJ SOLN
0.5000 mg | INTRAMUSCULAR | Status: DC | PRN
  Administered 2012-06-17: 1 mg via INTRAVENOUS
  Filled 2012-06-16: qty 1

## 2012-06-16 MED ORDER — ONDANSETRON HCL 4 MG/2ML IJ SOLN: 4.0000 mg | Freq: Three times a day (TID) | INTRAMUSCULAR | Status: DC | PRN

## 2012-06-16 MED ORDER — ONDANSETRON HCL 4 MG/2ML IJ SOLN
4.0000 mg | INTRAMUSCULAR | Status: DC | PRN
  Administered 2012-06-17: 4 mg via INTRAVENOUS
  Filled 2012-06-16: qty 2

## 2012-06-16 MED ORDER — ONDANSETRON HCL 4 MG PO TABS
4.0000 mg | ORAL_TABLET | ORAL | Status: DC | PRN
  Administered 2012-06-18: 4 mg via ORAL
  Filled 2012-06-16: qty 1

## 2012-06-16 MED ORDER — ENOXAPARIN SODIUM 40 MG/0.4ML ~~LOC~~ SOLN: 40.0000 mg | Freq: Two times a day (BID) | SUBCUTANEOUS | Status: DC

## 2012-06-16 MED ORDER — ACETAMINOPHEN 325 MG PO TABS: 650.0000 mg | ORAL_TABLET | Freq: Four times a day (QID) | ORAL | Status: DC | PRN

## 2012-06-16 MED ORDER — ONDANSETRON HCL 4 MG PO TABS: 4.0000 mg | ORAL_TABLET | Freq: Four times a day (QID) | ORAL | Status: DC | PRN

## 2012-06-16 MED ORDER — ENOXAPARIN SODIUM 40 MG/0.4ML ~~LOC~~ SOLN
40.0000 mg | SUBCUTANEOUS | Status: DC
  Administered 2012-06-16: 40 mg via SUBCUTANEOUS
  Filled 2012-06-16 (×4): qty 0.4

## 2012-06-16 MED ORDER — OXYCODONE HCL 5 MG PO TABS
5.0000 mg | ORAL_TABLET | ORAL | Status: DC | PRN
  Administered 2012-06-16 – 2012-06-18 (×2): 5 mg via ORAL
  Filled 2012-06-16 (×2): qty 1

## 2012-06-16 MED ORDER — ACETAMINOPHEN 650 MG RE SUPP: 325.0000 mg | Freq: Four times a day (QID) | RECTAL | Status: DC | PRN

## 2012-06-16 MED ORDER — SODIUM CHLORIDE 0.9 % IV SOLN: INTRAVENOUS | Status: DC

## 2012-06-16 MED ORDER — SODIUM CHLORIDE 0.9 % IV BOLUS (SEPSIS)
1000.0000 mL | Freq: Once | INTRAVENOUS | Status: AC
  Administered 2012-06-16: 1000 mL via INTRAVENOUS

## 2012-06-16 MED ORDER — SODIUM CHLORIDE 0.9 % IV SOLN
INTRAVENOUS | Status: DC
  Administered 2012-06-16 – 2012-06-17 (×3): via INTRAVENOUS

## 2012-06-16 MED ORDER — ACETAMINOPHEN 650 MG RE SUPP: 650.0000 mg | Freq: Four times a day (QID) | RECTAL | Status: DC | PRN

## 2012-06-16 NOTE — Progress Notes (Addendum)
  Courtesy visit.  Kathy Jimenez is well known to me from our cardio-oncology clinic where she was treated for Herceptin-induced cardiomyopathy. EF improved with therapy.   She is now admitted for hypotension, dehydration and hypercalcemia. Improving with IVF but still weak. CT appears to show progression of metastatic disease.   I have discussed with oncology. They will see her tomorrow. Will recheck echo just in case she needs to be back on Herceptin.   Appreciate [facility] care.   Daniel Bensimhon,MD 1:49 PM

## 2012-06-16 NOTE — ED Notes (Signed)
Pt up ambulatory to br

## 2012-06-16 NOTE — Progress Notes (Signed)
[facility] Progress Note [place] TEAM 1 - Stepdown/ICU TEAM   Kathy Jimenez RUE:[id number] DOB: Dec 08, 1970 DOA: 06/15/2012 PCP: No primary Kathy Jimenez on file.  Brief narrative: 42 y.o. 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   2 weeks ago she had undergone a cholecystectomy due to [facility] Cholecystitis, and prior to that she had radiation implants placed in the right lobe of her liver for treatment on January 29. 2014.  Due to her symptoms, she contacted her cardiologist and was strongly advised to report to the ED for evaluation.  When she arrived she was found to have a systolic blood pressures in the 70s systolic. She was administered 2 liters of NSS and had improvement in her blood pressures to a systolic of 120. Her laboratory studies revealed hypercalcemia with a calcium level of 12.4 and her bun and cr were elevated compared to her baseline (Cr= 1.34 now and was 0.7)   Assessment/Plan:  Hypercalcemia  Hypotension / Severe DH  Recurrent metastatic breast CA  Herceptin induced cardiomyopathy  Code Status: DNR  Family Communication: spoke w/ pt and daughter at bedside Disposition Plan: not ready for d/c   Consultants: CHF Team Oncology  Procedures: none  Antibiotics: none  DVT prophylaxis: lovenox  HPI/Subjective: Patient is seen for a followup visit   Objective: Blood pressure 92/55, pulse 88, temperature 98.9 F (37.2 C), temperature source Oral, resp. rate 16, height 5\' 5"  (1.651 m), weight 62.143 kg (137 lb), SpO2 100.00%. No intake or output data in the 24 hours ending 06/16/12 1758   Exam: Followup exam completed  Data Reviewed: Basic Metabolic Panel:  Recent  Labs Lab 06/15/12 1911 06/16/12 0326  NA 139 134*  K 4.2 3.8  CL 97 100  CO2 28 23  GLUCOSE 128* 93  BUN 25* 22  CREATININE 1.34* 1.00  CALCIUM 12.4* 9.5   Liver Function Tests:  Recent Labs Lab 06/15/12 1911 06/16/12 0326  AST 370* 297*  ALT 135* 105*  ALKPHOS 826* 613*  BILITOT 0.8 0.8  PROT 7.3 5.3*  ALBUMIN 3.0* 2.2*    Recent Labs Lab 06/15/12 2058  AMMONIA 55   CBC:  Recent Labs Lab 06/15/12 1911 06/16/12 0326  WBC 6.3 4.5  NEUTROABS 4.4  --   HGB 14.2 10.6*  HCT 42.6 32.2*  MCV 92.2 92.3  PLT 204 123*   Cardiac Enzymes:  Recent Labs Lab 06/15/12 2058  TROPONINI <0.30   BNP (last 3 results)  Recent Labs  06/15/12 1911  PROBNP 202.8*    Studies:  Recent x-ray studies have been reviewed in detail by the Attending Physician  Scheduled Meds:  Scheduled Meds: . sodium chloride   Intravenous Once  . enoxaparin (LOVENOX) injection  40 mg Subcutaneous Q24H   Continuous Infusions: . sodium chloride 75 mL/hr at 06/16/12 0436    Time spent on care of this patient: 86   [facility]  [facility] Office  [phone] Pager - Text Page per Kathy Jimenez as per below:  On-Call/Text Page:      [email or web address]      password [email or web address]  If 7PM-7AM, please contact night-coverage [email or web address] Password [email or web address] 06/16/2012, 5:58 PM   LOS: 1 day

## 2012-06-16 NOTE — H&P (Signed)
[facility] History and Physical  Kathy Jimenez ZOX:[id number] DOB: 1970/08/07 DOA: 06/15/2012  Referring physician:  PCP: No primary provider on file.  Specialists: Oncology:  Magrinat                      Cardiology:  Benshimon  Chief Complaint: Nausea and Weakness  HPI: Kathy Jimenez is a 42 y.o. female with a history of metastatic Breast Cancer dx 05/26/2002 s/p Lumpectomy, and chemotherapy resulting in Nonischemic Cardiomyopathy and Systolic CHF who has been having increased weakness and nausea and poor intake of foods and liquids over the past [redacted] weeks along with increase pain in her ABD the RUQ and epigastrium.   2 weeks ago she had undergone a cholecystectomy due to Acalculus Cholecystitis, and prior to that she had radiation implants placed in the right lobe of her liver for treatment on January 29. 2014.    Due to her symptoms, she contacted her cardiologist in the evening and was strongly advised to report to the ED for evaluation, and when she arrived she was found to have a systolic blood pressures in the 70s systolic.  She was administered 2 liters of NSS and had improvement in her blood pressures to a systolic of 120.  Her laboratory studies revealed hypercalcemia with a calcium level of 12. 4 and her bun and cr were elevated compared to her baseline (Cr= 1.34 now and was 0.7)  She was referred fro medical admission.      Review of Systems: The patient denies anorexia, fever, weight loss,, vision loss, decreased hearing, hoarseness, chest pain, syncope, dyspnea on exertion, peripheral edema, balance deficits, hemoptysis, abdominal pain, melena, hematochezia, severe indigestion/heartburn, hematuria, incontinence, genital sores, muscle weakness, suspicious skin lesions, transient blindness, difficulty walking, depression, unusual weight change, abnormal bleeding, enlarged lymph nodes, angioedema, and breast masses.    Past Medical History  Diagnosis Date  . Breast cancer  05/2002  . Kidney stones     recurrent since 1989  . Endometrial polyp   . PONV (postoperative nausea and vomiting)   . CHF (congestive heart failure)     from chemo treatment  . Breast cancer metastasized to multiple sites  April2012    Kidney, liver, bones (sternum, L1, left symphysis pubis)  . Urethral stricture    Past Surgical History  Procedure Laterality Date  . Hysteroscopy w/ endometrial ablation  03/11/2009    resection of endometrial polyps, total endometrial resection  . Breast lumpectomy  2004  . Cystoscopy with ureteroscopy  03/24/2011    Procedure: CYSTOSCOPY WITH URETEROSCOPY;  Surgeon: Garnett Farm, MD;  Location: [facility];  Service: Urology;  Laterality: Right;  dilitation of ureteral stricture  . Cholecystectomy N/A 05/29/2012    Procedure: LAPAROSCOPIC CHOLECYSTECTOMY WITH INTRAOPERATIVE CHOLANGIOGRAM;  Surgeon: Emelia Loron, MD;  Location: [facility];  Service: General;  Laterality: N/A;  Dr. Clovis Pu will assist    Medications:  HOME MEDS: Prior to Admission medications   Medication Sig Start Date End Date Taking? Authorizing Provider  carvedilol (COREG) 6.25 MG tablet Take 6.25 mg by mouth 2 (two) times daily with a meal.   Yes Historical Provider, MD  lidocaine-prilocaine (EMLA) cream Apply 1 application topically daily as needed. For port-a-cath when receiving chemo 03/15/12  Yes Pierce Crane, MD  lisinopril (PRINIVIL,ZESTRIL) 10 MG tablet Take 10 mg by mouth 2 (two) times daily. 11/08/11 11/07/12 Yes Bevelyn Buckles Bensimhon, MD  Potassium Citrate-Citric Acid (CYTRA K CRYSTALS) 3300-1002 MG PACK Take  1 packet by mouth daily. Mix in 6 ounces of water or juice.   Yes Historical Provider, MD  scopolamine (TRANSDERM-SCOP) 1.5 MG Place 1 patch onto the skin every 3 (three) days as needed. For motion sickness when traveling 06/07/12  Yes Lowella Dell, MD  spironolactone (ALDACTONE) 25 MG tablet Take 25 mg by mouth at bedtime.    Yes Historical Provider, MD     Allergies:  Allergies  Allergen Reactions  . Other Nausea And Vomiting    All pain meds  . Sulfa Antibiotics Hives    Social History:   reports that she has never smoked. She has never used smokeless tobacco. She reports that she drinks about 0.6 ounces of alcohol per week. She reports that she does not use illicit drugs.  Family History: Family History  Problem Relation Age of Onset  . CAD      None  . Hypertension      None  . Diabetes      None  . Cancer      None     Physical Exam:  GEN:  Pleasant  Ill appearing thin Caucasian Female examined  and in no acute distress; cooperative with exam Filed Vitals:   06/15/12 2315 06/15/12 2330 06/16/12 0030 06/16/12 0100  BP: 96/58 100/59 101/60 96/56  Pulse: 91 92 89 87  Temp:      TempSrc:      Resp:      Height:      Weight:      SpO2: 98% 99% 100% 100%   Blood pressure 96/56, pulse 87, temperature 97.9 F (36.6 C), temperature source Oral, resp. rate 21, height 5\' 5"  (1.651 m), weight 62.143 kg (137 lb), SpO2 100.00%. PSYCH: She is alert and oriented x4; does not appear anxious does not appear depressed; affect is normal HEENT: Normocephalic and Atraumatic, Mucous membranes pink; PERRLA; EOM intact; Fundi:  Benign;  No scleral icterus, Nares: Patent, Oropharynx: Clear, Fair Dentition, Neck:  FROM, no cervical lymphadenopathy nor thyromegaly or carotid bruit; no JVD; Breasts:: Not examined CHEST WALL: No tenderness CHEST: Normal respiration, clear to auscultation bilaterally HEART: Regular rate and rhythm; no murmurs rubs or gallops BACK: No kyphosis or scoliosis; no CVA tenderness ABDOMEN: Positive Bowel Sounds, Scaphoid,  soft non-tender; no masses, no organomegaly.    Rectal Exam: Not done EXTREMITIES: No cyanosis, clubbing or edema; no ulcerations. Genitalia: not examined PULSES: 2+ and symmetric SKIN: Normal hydration no rash or ulceration CNS: Cranial nerves 2-12 grossly intact no focal neurologic  deficit   Labs & Imaging Results for orders placed during the hospital encounter of 06/15/12 (from the past 48 hour(s))  CBC WITH DIFFERENTIAL     Status: Abnormal   Collection Time    06/15/12  7:11 PM      Result Value Range   WBC 6.3  4.0 - 10.5 K/uL   RBC 4.62  3.87 - 5.11 MIL/uL   Hemoglobin 14.2  12.0 - 15.0 g/dL   HCT 16.1  09.6 - 04.5 %   MCV 92.2  78.0 - 100.0 fL   MCH 30.7  26.0 - 34.0 pg   MCHC 33.3  30.0 - 36.0 g/dL   RDW 40.9  81.1 - 91.4 %   Platelets 204  150 - 400 K/uL   Neutrophils Relative 70  43 - 77 %   Neutro Abs 4.4  1.7 - 7.7 K/uL   Lymphocytes Relative 15  12 - 46 %   Lymphs Abs  1.0  0.7 - 4.0 K/uL   Monocytes Relative 13 (*) 3 - 12 %   Monocytes Absolute 0.8  0.1 - 1.0 K/uL   Eosinophils Relative 1  0 - 5 %   Eosinophils Absolute 0.1  0.0 - 0.7 K/uL   Basophils Relative 0  0 - 1 %   Basophils Absolute 0.0  0.0 - 0.1 K/uL  COMPREHENSIVE METABOLIC PANEL     Status: Abnormal   Collection Time    06/15/12  7:11 PM      Result Value Range   Sodium 139  135 - 145 mEq/L   Potassium 4.2  3.5 - 5.1 mEq/L   Chloride 97  96 - 112 mEq/L   CO2 28  19 - 32 mEq/L   Glucose, Bld 128 (*) 70 - 99 mg/dL   BUN 25 (*) 6 - 23 mg/dL   Creatinine, Ser 1.61 (*) 0.50 - 1.10 mg/dL   Calcium 09.6 (*) 8.4 - 10.5 mg/dL   Total Protein 7.3  6.0 - 8.3 g/dL   Albumin 3.0 (*) 3.5 - 5.2 g/dL   AST 045 (*) 0 - 37 U/L   ALT 135 (*) 0 - 35 U/L   Alkaline Phosphatase 826 (*) 39 - 117 U/L   Total Bilirubin 0.8  0.3 - 1.2 mg/dL   GFR calc non Af Amer 48 (*) >90 mL/min   GFR calc Af Amer 55 (*) >90 mL/min   Comment:            The eGFR has been calculated     using the CKD EPI equation.     This calculation has not been     validated in all clinical     situations.     eGFR's persistently     <90 mL/min signify     possible Chronic Kidney Disease.  PRO B NATRIURETIC PEPTIDE     Status: Abnormal   Collection Time    06/15/12  7:11 PM      Result Value Range   Pro B  Natriuretic peptide (BNP) 202.8 (*) 0 - 125 pg/mL  PROTIME-INR     Status: None   Collection Time    06/15/12  7:11 PM      Result Value Range   Prothrombin Time 13.7  11.6 - 15.2 seconds   INR 1.06  0.00 - 1.49  APTT     Status: None   Collection Time    06/15/12  7:11 PM      Result Value Range   aPTT 33  24 - 37 seconds  LACTIC ACID, PLASMA     Status: Abnormal   Collection Time    06/15/12  8:28 PM      Result Value Range   Lactic Acid, Venous 3.7 (*) 0.5 - 2.2 mmol/L  AMMONIA     Status: None   Collection Time    06/15/12  8:58 PM      Result Value Range   Ammonia 55  11 - 60 umol/L  TROPONIN I     Status: None   Collection Time    06/15/12  8:58 PM      Result Value Range   Troponin I <0.30  <0.30 ng/mL   Comment:            Due to the release kinetics of cTnI,     a negative result within the first hours     of the onset of symptoms does not rule out  myocardial infarction with certainty.     If myocardial infarction is still suspected,     repeat the test at appropriate intervals.     Cardiac Enzymes:  Recent Labs Lab 06/15/12 2058  TROPONINI <0.30    BNP (last 3 results)  Recent Labs  06/15/12 1911  PROBNP 202.8*   CBG: No results found for this basename: GLUCAP,  in the last 168 hours  Radiological Exams on Admission: Ct Angio Chest Pe W/cm &/or Wo Cm  06/16/2012  *RADIOLOGY REPORT*  Clinical Data:  Shortness of breath and chest pain.  Weakness; right upper quadrant abdominal pain and back pain.  Blurred vision. History of metastatic breast cancer.  CT ANGIOGRAPHY CHEST CT ABDOMEN AND PELVIS WITH CONTRAST  Technique:  Multidetector CT imaging of the chest was performed using the standard protocol during bolus administration of intravenous contrast.  Multiplanar CT image reconstructions including MIPs were obtained to evaluate the vascular anatomy. Multidetector CT imaging of the abdomen and pelvis was performed using the standard protocol during  bolus administration of intravenous contrast.  Contrast: OMNIPAQUE IOHEXOL 350 MG/ML SOLN  Comparison:   CTA of the chest performed 05/27/2012, and PET / CT performed 04/15/2012.  CTA CHEST  Findings:  There is no evidence of significant pulmonary embolus.  The lungs are essentially clear bilaterally.  A tiny 4 mm nodule within the left upper lobe (image 38 of 83) appears grossly stable from prior studies.  There is no evidence of significant focal consolidation, pleural effusion or pneumothorax.  No masses are identified; no abnormal focal contrast enhancement is seen.  A mildly enlarged right hilar node is seen, measuring 1.2 cm in short axis.  This has increased mildly in size from prior studies. There is no evidence of mediastinal lymphadenopathy.  No pericardial effusion is identified.  The great vessels are grossly unremarkable in appearance.  No axillary lymphadenopathy is seen. The visualized portions of the thyroid gland are unremarkable in appearance.  Postoperative change is noted at the left breast.  A right-sided chest port is noted ending about the cavoatrial junction.  No acute osseous abnormalities are seen.   Review of the MIP images confirms the above findings.  IMPRESSION:  1.  No evidence of significant pulmonary embolus. 2.  Lungs essentially clear bilaterally. 3.  Mildly enlarged right hilar node, measuring 1.2 cm in short axis; this has increased mildly in size from prior studies, and is nonspecific.  CT ABDOMEN AND PELVIS  Findings: Innumerable metastatic lesions are noted throughout the liver, significantly worsened from prior studies.  This is most prominent along the caudate lobe, where a 6.4 cm mass abuts the lesser curvature of the stomach.  The patient is status post resection of the left hepatic lobe.  The spleen is unremarkable in appearance.  The pancreas and adrenal glands are grossly unremarkable.  The patient is status post cholecystectomy, with clips noted at the  gallbladder fossa.  There is slight asymmetric prominence of the right ureter along its entire course; this appears grossly stable from the prior PET / CT, and may reflect the patient's baseline.  Alternatively, mild ureteritis could have such an appearance.  A vague 1.6 cm linear focus of decreased attenuation at the interpole region of the right kidney is nonspecific but could reflect a small cyst or possibly minimal pyelonephritis.  It is not definitely characterized on the prior PET / CT.  A nonobstructing 3 mm stone is noted near the upper pole of the left kidney.  The kidneys are otherwise unremarkable in appearance.  The small bowel is unremarkable in appearance.  The stomach is within normal limits.  No acute vascular abnormalities are seen.  Note is made of a 1.2 cm omental mass anterior to the liver at the midline, apparently new from prior studies.  No additional omental metastases are identified.  Trace free fluid is noted about the inferior tip of the liver, tracking to the right paracolic gutter. This may correspond to underlying omental disease.  The appendix is normal in caliber, without evidence for appendicitis.  The colon is largely decompressed and is unremarkable in appearance, aside from two apparent small diverticula along the sigmoid colon.  The bladder is mildly distended and grossly unremarkable.  A small amount of free fluid within the pelvic cul-de-sac is nonspecific in nature.  The ovaries are relatively symmetric.  No suspicious adnexal masses are seen.  The uterus is grossly unremarkable in appearance.  No inguinal lymphadenopathy is seen.  No acute osseous abnormalities are identified.  A 1.4 cm sclerotic focus within vertebral body L1 and sclerotic foci within the sacrum, are stable from 2013 and may reflect treated metastases. There is stable extensive sclerotic expansion of the sternum and manubrium.  Review of the MIP images confirms the above findings.  IMPRESSION:  1.  Worsening  innumerable metastases noted throughout the liver; a 6.4 cm caudate lobe mass is seen abutting the lesser curvature of the stomach.  2.  Vague linear focus of decreased attenuation at the interpole region of the right kidney is nonspecific and could reflect very mild pyelonephritis or possibly a cyst.  Slight asymmetric prominence of the right ureter along its entire course appears grossly stable from the prior PET / CT and may reflect the patient's baseline, though mild ureteritis could have such an appearance. 3.  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  Trace free fluid about the inferior tip of the liver, tracking to the right paracolic gutter, is also new and may reflect underlying omental disease. 4.  3 mm nonobstructing stone near the upper pole of the left kidney. 5.  Stable extensive sclerotic expansion of the sternum and manubrium, and sclerotic foci within the L1 and the sacrum, likely reflect treated disease.  No acute osseous abnormalities identified.   Original Report Authenticated By: Tonia Ghent, M.D.    Ct Abdomen Pelvis W Contrast  06/16/2012  *RADIOLOGY REPORT*  Clinical Data:  Shortness of breath and chest pain.  Weakness; right upper quadrant abdominal pain and back pain.  Blurred vision. History of metastatic breast cancer.  CT ANGIOGRAPHY CHEST CT ABDOMEN AND PELVIS WITH CONTRAST  Technique:  Multidetector CT imaging of the chest was performed using the standard protocol during bolus administration of intravenous contrast.  Multiplanar CT image reconstructions including MIPs were obtained to evaluate the vascular anatomy. Multidetector CT imaging of the abdomen and pelvis was performed using the standard protocol during bolus administration of intravenous contrast.  Contrast: OMNIPAQUE IOHEXOL 350 MG/ML SOLN  Comparison:   CTA of the chest performed 05/27/2012, and PET / CT performed 04/15/2012.  CTA CHEST  Findings:   There is no evidence of significant pulmonary embolus.  The lungs are essentially clear bilaterally.  A tiny 4 mm nodule within the left upper lobe (image 38 of 83) appears grossly stable from prior studies.  There is no evidence of significant focal consolidation, pleural effusion or pneumothorax.  No masses are  identified; no abnormal focal contrast enhancement is seen.  A mildly enlarged right hilar node is seen, measuring 1.2 cm in short axis.  This has increased mildly in size from prior studies. There is no evidence of mediastinal lymphadenopathy.  No pericardial effusion is identified.  The great vessels are grossly unremarkable in appearance.  No axillary lymphadenopathy is seen. The visualized portions of the thyroid gland are unremarkable in appearance.  Postoperative change is noted at the left breast.  A right-sided chest port is noted ending about the cavoatrial junction.  No acute osseous abnormalities are seen.   Review of the MIP images confirms the above findings.  IMPRESSION:  1.  No evidence of significant pulmonary embolus. 2.  Lungs essentially clear bilaterally. 3.  Mildly enlarged right hilar node, measuring 1.2 cm in short axis; this has increased mildly in size from prior studies, and is nonspecific.  CT ABDOMEN AND PELVIS  Findings: Innumerable metastatic lesions are noted throughout the liver, significantly worsened from prior studies.  This is most prominent along the caudate lobe, where a 6.4 cm mass abuts the lesser curvature of the stomach.  The patient is status post resection of the left hepatic lobe.  The spleen is unremarkable in appearance.  The pancreas and adrenal glands are grossly unremarkable.  The patient is status post cholecystectomy, with clips noted at the gallbladder fossa.  There is slight asymmetric prominence of the right ureter along its entire course; this appears grossly stable from the prior PET / CT, and may reflect the patient's baseline.  Alternatively, mild  ureteritis could have such an appearance.  A vague 1.6 cm linear focus of decreased attenuation at the interpole region of the right kidney is nonspecific but could reflect a small cyst or possibly minimal pyelonephritis.  It is not definitely characterized on the prior PET / CT.  A nonobstructing 3 mm stone is noted near the upper pole of the left kidney.  The kidneys are otherwise unremarkable in appearance.  The small bowel is unremarkable in appearance.  The stomach is within normal limits.  No acute vascular abnormalities are seen.  Note is made of a 1.2 cm omental mass anterior to the liver at the midline, apparently new from prior studies.  No additional omental metastases are identified.  Trace free fluid is noted about the inferior tip of the liver, tracking to the right paracolic gutter. This may correspond to underlying omental disease.  The appendix is normal in caliber, without evidence for appendicitis.  The colon is largely decompressed and is unremarkable in appearance, aside from two apparent small diverticula along the sigmoid colon.  The bladder is mildly distended and grossly unremarkable.  A small amount of free fluid within the pelvic cul-de-sac is nonspecific in nature.  The ovaries are relatively symmetric.  No suspicious adnexal masses are seen.  The uterus is grossly unremarkable in appearance.  No inguinal lymphadenopathy is seen.  No acute osseous abnormalities are identified.  A 1.4 cm sclerotic focus within vertebral body L1 and sclerotic foci within the sacrum, are stable from 2013 and may reflect treated metastases. There is stable extensive sclerotic expansion of the sternum and manubrium.  Review of the MIP images confirms the above findings.  IMPRESSION:  1.  Worsening innumerable metastases noted throughout the liver; a 6.4 cm caudate lobe mass is seen abutting the lesser curvature of the stomach.  2.  Vague linear focus of decreased attenuation at the interpole region of the  right kidney  is nonspecific and could reflect very mild pyelonephritis or possibly a cyst.  Slight asymmetric prominence of the right ureter along its entire course appears grossly stable from the prior PET / CT and may reflect the patient's baseline, though mild ureteritis could have such an appearance. 3.  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  Trace free fluid about the inferior tip of the liver, tracking to the right paracolic gutter, is also new and may reflect underlying omental disease. 4.  3 mm nonobstructing stone near the upper pole of the left kidney. 5.  Stable extensive sclerotic expansion of the sternum and manubrium, and sclerotic foci within the L1 and the sacrum, likely reflect treated disease.  No acute osseous abnormalities identified.   Original Report Authenticated By: Tonia Ghent, M.D.       Assessment/Plan Principal Problem:   Dehydration Active Problems:   Hypercalcemia   Elevated transaminase level   Nausea   Abdominal pain   Chronic systolic heart failure   Breast cancer metastasized to multiple sites    1.   Dehydration-   Due to Nausea and poor intake of foods and liquids, IVFS for hydration.   Hypotension resolved after 2 liter IVFs given in ED.    2.   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  IV Pamidronate ordered, and IVFs for rehydration.     3.   Elevated Transaminase levels -  Due to metastasis to liver.      4.   Nausea - multifactorial- due to metastatic disease to Liver, and hypercalcemia.  Anti-Emetics PRN.  Clear liquid Diet , advance as tolerated.    5.   ABD Pain-  Due to metastatic disease, pain control PRN.    6.   Chronic Systolic CHF-  Currently no decompensation, gently rehydrating for now.    7.  Breast Cancer with Met Dz-  See Oncologist Dr. Darnelle Catalan, notify oncology in AM of admission.      Code Status: FULL CODE Family Communication:      HUSBAND AT BEDSIDE Disposition Plan:      RETURN TO HOME ON DISCHARGE  Time spent:   26 MINUTES  Ron Parker [facility] Pager [phone]  If 7PM-7AM, please contact night-coverage [email or web address] Password [facility] 06/16/2012, 3:32 AM

## 2012-06-17 DIAGNOSIS — I509 Heart failure, unspecified: Secondary | ICD-10-CM

## 2012-06-17 LAB — COMPREHENSIVE METABOLIC PANEL
ALT: 123 U/L — ABNORMAL HIGH (ref 0–35)
CO2: 25 mEq/L (ref 19–32)
Calcium: 9 mg/dL (ref 8.4–10.5)
GFR calc Af Amer: >90 mL/min (ref >90)
GFR calc non Af Amer: 79 mL/min — ABNORMAL LOW (ref >90)
Glucose, Bld: 79 mg/dL (ref 70–99)
Sodium: 136 mEq/L (ref 135–145)

## 2012-06-17 LAB — URINALYSIS, ROUTINE W REFLEX MICROSCOPIC
Ketones, ur: 15 mg/dL — AB
Leukocytes, UA: NEGATIVE
Nitrite: NEGATIVE
Specific Gravity, Urine: 1.018 (ref 1.005–1.030)
pH: 5.5 (ref 5.0–8.0)

## 2012-06-17 LAB — CBC
HCT: 36.3 % (ref 36.0–46.0)
Hemoglobin: 11.9 g/dL — ABNORMAL LOW (ref 12.0–15.0)
MCHC: 32.8 g/dL (ref 30.0–36.0)
RDW: 14.9 % (ref 11.5–15.5)
WBC: 3.8 10*3/uL — ABNORMAL LOW (ref 4.0–10.5)

## 2012-06-17 MED ORDER — LORAZEPAM 0.5 MG PO TABS
0.5000 mg | ORAL_TABLET | Freq: Every evening | ORAL | Status: DC | PRN
  Filled 2012-06-17: qty 1

## 2012-06-17 MED ORDER — PROMETHAZINE HCL 25 MG/ML IJ SOLN
12.5000 mg | Freq: Four times a day (QID) | INTRAMUSCULAR | Status: DC | PRN
  Administered 2012-06-17: 12.5 mg via INTRAVENOUS
  Filled 2012-06-17: qty 1

## 2012-06-17 NOTE — Evaluation (Signed)
Physical Therapy Evaluation Patient Details Name: Kathy Jimenez MRN: [id number] DOB: 1970-07-21 Today's Date: 06/17/2012 Time: 4132-4401 PT Time Calculation (min): 21 min  PT Assessment / Plan / Recommendation Clinical Impression  Pt is a 41 yo admitted with dehydration and hypercalcemia with known history of met. breast ca with noted progression to spine and liver. Pt functioning near baseline however has generalized deconditioning. Pt used to work 80 hours a week and now has decreased activity tolerance. Pt motivated and is to walk as tolerated but minimal 3x/day with family/significant other/RN staff. Pt remains to be functioning independently and safe to d/c home from mobility stand point with 24/7 supervision until her activity tolerance improves. Family and significant other can provide 24/7 supervision. Pt with no further acute PT needs at this time. Pt agreeable to try to amb atleast 3x/day and denies concerns re: d/c home. Please re-consult if needed in future. Thanks.    PT Assessment  Patent does not need any further PT services    Follow Up Recommendations  No PT follow up;Supervision/Assistance - 24 hour    Does the patient have the potential to tolerate intense rehabilitation      Barriers to Discharge        Equipment Recommendations       Recommendations for Other Services     Frequency      Precautions / Restrictions Precautions Precautions: None Restrictions Weight Bearing Restrictions: No   Pertinent Vitals/Pain 5/10 back pain, 3/10 liver pain      Mobility  Bed Mobility Bed Mobility: Rolling Right;Right Sidelying to Sit;Supine to Sit;Sit to Supine Rolling Right: 7: Independent Right Sidelying to Sit: 7: Independent Supine to Sit: 7: Independent Sit to Supine: 7: Independent Details for Bed Mobility Assistance: edu on log roll technique due to recent gall bladder removal 2 weeks ago and chronic back pain Transfers Transfers: Sit to Stand;Stand to  Sit Sit to Stand: 6: Modified independent (Device/Increase time);With upper extremity assist;From bed Stand to Sit: With upper extremity assist;To bed Details for Transfer Assistance: pt with c/o of nausea upon standing Ambulation/Gait Ambulation/Gait Assistance: 5: Supervision Ambulation Distance (Feet): 100 Feet Assistive device: None Ambulation/Gait Assistance Details: pt c/o "I just feel exhausted and fatigued." pt also with c/o of mild SOB. provided pt with incentive spirometer. no episodes of LOB Gait Pattern: Step-through pattern Gait velocity: slower than PTA but [facility] General Gait Details: pt guarded and reports she wouldn't walker any further by her self. Stairs: No    Exercises     PT Diagnosis:    PT Problem List:   PT Treatment Interventions:     PT Goals Acute Rehab PT Goals PT Goal Formulation:  (n/a) Time For Goal Achievement: 06/24/12  Visit Information  Last PT Received On: 06/17/12 Assistance Needed: +1    Subjective Data  Subjective: Pt received supine in bed with c/o 5/10 back pain. agreeable to PT. Patient Stated Goal: home   Prior Functioning  Home Living Lives With: Alone Available Help at Discharge: Family;Friend(s);Available 24 hours/day Type of Home: House Home Access: Level entry Home Layout: One level Additional Comments: pt works 80 hours a week, has 43 yo in college at [employer] Prior Function Level of Independence: Independent Able to Take Stairs?: Yes Driving: Yes Vocation: Full time employment Communication Communication: No difficulties Dominant Hand: Right    Cognition  Cognition Overall Cognitive Status: Appears within functional limits for tasks assessed/performed Arousal/Alertness: Awake/alert Orientation Level: Oriented X4 / Intact Behavior During Session: [facility] for  tasks performed    Extremity/Trunk Assessment Right Upper Extremity Assessment RUE ROM/Strength/Tone: Within functional levels RUE Sensation: WFL - Light  Touch Left Upper Extremity Assessment LUE ROM/Strength/Tone: Within functional levels LUE Sensation: WFL - Light Touch Right Lower Extremity Assessment RLE ROM/Strength/Tone: Within functional levels RLE Sensation: WFL - Light Touch Left Lower Extremity Assessment LLE ROM/Strength/Tone: Within functional levels LLE Sensation: WFL - Light Touch   Balance    End of Session PT - End of Session Activity Tolerance: Patient limited by fatigue Patient left: in bed;with call bell/phone within reach;with family/visitor present Nurse Communication: Mobility status  GP Functional Assessment Tool Used: clincial judgement Functional Limitation: Mobility: Walking and moving around Mobility: Walking and Moving Around Current Status ([id number]): At least 1 percent but less than 20 percent impaired, limited or restricted (due to decreased activity tolerance) Mobility: Walking and Moving Around Goal Status [phone]): At least 1 percent but less than 20 percent impaired, limited or restricted Mobility: Walking and Moving Around Discharge Status [id number]): At least 1 percent but less than 20 percent impaired, limited or restricted   Marcene Brawn 06/17/2012, 10:58 AM  Lewis Shock, PT, DPT Pager #: [phone] Office #: [phone]

## 2012-06-17 NOTE — Evaluation (Signed)
Occupational Therapy Evaluation Patient Details Name: VAEDA WESTALL MRN: [id number] DOB: 1970/08/24 Today's Date: 06/17/2012 Time: 4098-1191 OT Time Calculation (min): 12 min  OT Assessment / Plan / Recommendation Clinical Impression  This 42 y.o. female with h/o stage IV breast CA admitted with severe dehydration and hypercalemia.  Pt presents to OT with decreased activity tolerance, decreased independence with BADLs due to pain from recent surgery.  Pt is interested in possible AE to allow her to maintain independence until she is able to perform ADLs independently    OT Assessment  Patient needs continued OT Services    Follow Up Recommendations  No OT follow up;Supervision - Intermittent    Barriers to Discharge None    Equipment Recommendations  None recommended by OT    Recommendations for Other Services    Frequency  Min 2X/week    Precautions / Restrictions Precautions Precautions: None Restrictions Weight Bearing Restrictions: No       ADL  Lower Body Dressing: Moderate assistance (due to discomfort accessing Lt foot due to recent surgery) Where Assessed - Lower Body Dressing: Supported sit to Pharmacist, hospital: Community education officer Method: Sit to Barista: Comfort height toilet Toileting - Architect and Hygiene: Independent Where Assessed - Toileting Clothing Manipulation and Hygiene: Standing Transfers/Ambulation Related to ADLs: pt ambulates independently ADL Comments: Pt and friend were instructed to use shower seat at home, and provided with options for this.  Also, discussed energy conservation techniques and reinforced need for pt to ambulate, participate in activity several times a day.  pt reports extreme fatigue.   Discussed pacing self, and sitting for activities to allow her to complete more of her daily tasks before fatiguing.  Eval cut short due to transport arrival for [facility].  Attempted to see pt a second  time, however, pt sleeping soundly    OT Diagnosis: Generalized weakness  OT Problem List: Decreased strength;Decreased activity tolerance;Decreased knowledge of use of DME or AE OT Treatment Interventions: Self-care/ADL training;DME and/or AE instruction;Patient/family education   OT Goals Acute Rehab OT Goals OT Goal Formulation: With patient Time For Goal Achievement: 06/24/12 Potential to Achieve Goals: Good ADL Goals Pt Will Perform Lower Body Dressing: with modified independence;with adaptive equipment;Sit to stand from chair;Sit to stand from bed ADL Goal: Lower Body Dressing - Progress: Goal set today Additional ADL Goal #1: Pt will verbalize independence with energy conservation techniques ADL Goal: Additional Goal #1 - Progress: Goal set today  Visit Information  Last OT Received On: 06/17/12 Assistance Needed: +1    Subjective Data  Subjective: "If you think this fatigue is doing well" Patient Stated Goal: To return to active lifestyle   Prior Functioning     Home Living Lives With: Alone Available Help at Discharge: Family;Friend(s);Available 24 hours/day Type of Home: House Home Access: Level entry Home Layout: One level Bathroom Shower/Tub: Engineer, manufacturing systems: Standard Additional Comments: pt works 80 hours a week, has 42 yo in college at [employer] Prior Function Level of Independence: Independent Able to Take Stairs?: Yes Driving: Yes Vocation: Full time employment Communication Communication: No difficulties Dominant Hand: Right         Vision/Perception     Cognition  Cognition Overall Cognitive Status: Appears within functional limits for tasks assessed/performed Arousal/Alertness: Awake/alert Orientation Level: Oriented X4 / Intact Behavior During Session: [facility] for tasks performed    Extremity/Trunk Assessment Right Upper Extremity Assessment RUE ROM/Strength/Tone: Within functional levels RUE Sensation: WFL - Light  Touch  RUE Coordination: WFL - gross/fine motor Left Upper Extremity Assessment LUE ROM/Strength/Tone: Within functional levels LUE Sensation: WFL - Light Touch LUE Coordination: WFL - gross/fine motor Right Lower Extremity Assessment RLE ROM/Strength/Tone: Within functional levels RLE Sensation: WFL - Light Touch Left Lower Extremity Assessment LLE ROM/Strength/Tone: Within functional levels LLE Sensation: WFL - Light Touch     Mobility Bed Mobility Bed Mobility: Rolling Right;Right Sidelying to Sit;Supine to Sit Rolling Right: 7: Independent Right Sidelying to Sit: 7: Independent Supine to Sit: 7: Independent Sit to Supine: 7: Independent Details for Bed Mobility Assistance: edu on log roll technique due to recent gall bladder removal 2 weeks ago and chronic back pain Transfers Sit to Stand: 7: Independent;From toilet;From bed Stand to Sit: 7: Independent;To toilet Details for Transfer Assistance: pt with c/o of nausea upon standing     Exercise     Balance     End of Session OT - End of Session Activity Tolerance: Patient limited by fatigue Patient left: Other (comment) (being transported for Echo)  GO     Carly Applegate, Ursula Alert M 06/17/2012, 1:44 PM

## 2012-06-17 NOTE — Progress Notes (Signed)
[facility] Progress Note [place] TEAM 1 - Stepdown/ICU TEAM   Kathy Jimenez JWJ:[id number] DOB: 27-Sep-1970 DOA: 06/15/2012 PCP: No primary provider on file.  Brief narrative: 42 y.o. 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   2 weeks ago she had undergone a cholecystectomy due to Acalculus Cholecystitis, and prior to that she had radiation implants placed in the right lobe of her liver for treatment on January 29. 2014.  Due to her symptoms, she contacted her cardiologist and was strongly advised to report to the ED for evaluation.  When she arrived she was found to have a systolic blood pressures in the 70s systolic. She was administered 2 liters of NSS and had improvement in her blood pressures to a systolic of 120. Her laboratory studies revealed hypercalcemia with a calcium level of 12.4 and her bun and cr were elevated compared to her baseline (Cr= 1.34 now and was 0.7)   Assessment/Plan:  Hypercalcemia due to severe dehydration in setting of metastatic cancer - was given a dose of pamidronate by admitting physician - has been adequately volume resuscitated - calcium has now normalized - discontinue IV fluid and recheck calcium in morning  Hypotension / Severe DH Clinically the patient's dehydration appears to have resolved - she remains somewhat hypotensive at this time - she denies orthostatic symptoms - she does admit to early fatigue with physical exertion but no focal weakness - we will continue to hold her beta blocker, ACE inhibitor, and Aldactone - given that her ejection fraction has recovered it is likely that she will not require these medications again (but we will leave this decision to her  cardiologist)  Anorexia The nurse confirms that the patient is eating almost nothing from her trays - I have discussed this at length with the patient - she denies intractable nausea vomiting or odynophagia - I have discussed the possible need to use appetite stimulants - she does not wish to attempt this at this time - she has promised to force herself to eat  Recurrent metastatic breast CA As per oncology - see consult note  Herceptin induced cardiomyopathy Echocardiogram today suggests the patient has recovered normal systolic function - heart failure team is following  Code Status: DNR  Family Communication: spoke w/ pt at bedside Disposition Plan: not ready for d/c - possible discharge in a.m.  Consultants: CHF Team Oncology  Procedures: 3/10 - TTE - ejection fraction 55-60% with no focal wall motion abnormalities - wall thickness normal - systolic function normal - cavity size normal  Antibiotics: none  DVT prophylaxis: lovenox  HPI/Subjective: The patient is alert oriented and in good spirits.  She denies chest pain fevers chills nausea or vomiting.  She complains of some nagging pain in her epigastrium and right upper quadrant which is relatively chronic for her.  She admits to fatiguing very early with even minimal physical exertion.  She specifically denies orthostatic symptoms.  Objective: Blood pressure 97/57, pulse 92, temperature 98.7 F (37.1 C), temperature source Oral, resp. rate 20, height 5\' 5"  (1.651 m), weight 62.143 kg (137 lb), SpO2 98.00%.  Intake/Output Summary (Last 24 hours) at 06/17/12 1710 Last data filed at 06/17/12 1459  Gross per 24 hour  Intake 2173.75 ml  Output    900 ml  Net 1273.75 ml    Exam: General: No acute respiratory distress Lungs: Clear to auscultation bilaterally without wheezes or crackles Cardiovascular: Regular rate and rhythm without murmur gallop or rub  Abdomen: Mildly tender to palpation in the epigastrium and right  upper quadrant, nondistended, soft, bowel sounds positive, no rebound, no ascites, liver is somewhat full to palpation Extremities: No significant cyanosis, clubbing, or edema bilateral lower extremities   Data Reviewed: Basic Metabolic Panel:  Recent Labs Lab 06/15/12 1911 06/16/12 0326 06/17/12 0654  NA 139 134* 136  K 4.2 3.8 3.8  CL 97 100 102  CO2 28 23 25   GLUCOSE 128* 93 79  BUN 25* 22 14  CREATININE 1.34* 1.00 0.88  CALCIUM 12.4* 9.5 9.0   Liver Function Tests:  Recent Labs Lab 06/15/12 1911 06/16/12 0326 06/17/12 0654  AST 370* 297* 373*  ALT 135* 105* 123*  ALKPHOS 826* 613* 735*  BILITOT 0.8 0.8 0.8  PROT 7.3 5.3* 5.8*  ALBUMIN 3.0* 2.2* 2.3*    Recent Labs Lab 06/15/12 2058  AMMONIA 55   CBC:  Recent Labs Lab 06/15/12 1911 06/16/12 0326 06/17/12 0654  WBC 6.3 4.5 3.8*  NEUTROABS 4.4  --   --   HGB 14.2 10.6* 11.9*  HCT 42.6 32.2* 36.3  MCV 92.2 92.3 94.0  PLT 204 123* 121*   Cardiac Enzymes:  Recent Labs Lab 06/15/12 2058  TROPONINI <0.30   BNP (last 3 results)  Recent Labs  06/15/12 1911  PROBNP 202.8*    Studies:  Recent x-ray studies have been reviewed in detail by the Attending Physician  Scheduled Meds:  Scheduled Meds: . enoxaparin (LOVENOX) injection  40 mg Subcutaneous Q24H   Continuous Infusions: . sodium chloride 75 mL/hr at 06/17/12 1129    Time spent on care of this patient: 64   [facility] T  [facility] Office  [phone] Pager - Text Page per Loretha Stapler as per below:  On-Call/Text Page:      [email or web address]      password [email or web address]  If 7PM-7AM, please contact night-coverage [email or web address] Password [email or web address] 06/17/2012, 5:10 PM   LOS: 2 days

## 2012-06-17 NOTE — Progress Notes (Signed)
Kathy Jimenez   DOB:August 07, 1970   NF#:[id number]   QIO#:[id number]    PCP: No primary provider on file.  GYN: Pamelia Hoit NP  SU: Cicero Duck  OTHER MD: Arvilla Meres, Margaretmary Dys    HISTORY OF PRESENT ILLNESS:  "Kathy Jimenez" underwent left lumpectomy and sentinel lymph node sampling for stage IA invasive ductal carcinoma, grade 3, in February of 2004. The tumor was "triple positive", and she was treated with FEC x6 chemotherapy, then radiation, then tamoxifen for 5 years. She subsequently participated in the [facility] steady and received lapatinib or placebo for 1 year.  Unfortunately in April of 2012 a rising CA 27-29 lead to restaging studies which showed recurrent disease involving bone and liver, as well as some lymph nodes. Liver biopsy confirmed metastatic breast cancer, again triple positive. The patient's complex subsequent treatment history is detailed below.   Subjective: She was in [place] last week, started feeling "bad" 03/03, eventually called her cardiologist Dr Gala Romney ("I thought it was my heart medicines") and he recommended she present to the ER as soon as she got back in town. There she was found to be hypercalcemic and severely dehydrated. Vitals normalized with fluids and today her calcium and creatinine are back to normal.-- She feels weak, but was able to walk to BR w/o assistance--has not walked outside of the room. Had normal BM yesterday AM and normal bladder function, no focal weakness. Low back pain is more "a discomfort." No nausea or vomiting, and able to take PO's well. "I don't understand how this came on so suddenly."   Objective: middle aged white woman examined in bed Filed Vitals:   06/17/12 0625  BP: 94/56  Pulse: 89  Temp: 98.4 F (36.9 C)  Resp: 18    Body mass index is 22.8 kg/(m^2).  Intake/Output Summary (Last 24 hours) at 06/17/12 0816 Last data filed at 06/16/12 2304  Gross per 24 hour  Intake   1100 ml  Output    200 ml  Net    900 ml      Careful palpation of spine shows mild discomfort in lower thoracic area  No focal weakness  CBG (last 3)  No results found for this basename: GLUCAP,  in the last 72 hours   Labs:  Lab Results  Component Value Date   WBC 3.8* 06/17/2012   HGB 11.9* 06/17/2012   HCT 36.3 06/17/2012   MCV 94.0 06/17/2012   PLT 121* 06/17/2012   NEUTROABS 4.4 06/15/2012    @LASTCHEMISTRY @  Urine Studies No results found for this basename: UACOL, UAPR, USPG, UPH, UTP, UGL, UKET, UBIL, UHGB, UNIT, UROB, ULEU, UEPI, UWBC, URBC, UBAC, CAST, CRYS, UCOM, BILUA,  in the last 72 hours  Basic Metabolic Panel:  Recent Labs Lab 06/15/12 1911 06/16/12 0326 06/17/12 0654  NA 139 134* 136  K 4.2 3.8 3.8  CL 97 100 102  CO2 28 23 25   GLUCOSE 128* 93 79  BUN 25* 22 14  CREATININE 1.34* 1.00 0.88  CALCIUM 12.4* 9.5 9.0   GFR Estimated Creatinine Clearance: 74.2 ml/min (by C-G formula based on Cr of 0.88). Liver Function Tests:  Recent Labs Lab 06/15/12 1911 06/16/12 0326 06/17/12 0654  AST 370* 297* 373*  ALT 135* 105* 123*  ALKPHOS 826* 613* 735*  BILITOT 0.8 0.8 0.8  PROT 7.3 5.3* 5.8*  ALBUMIN 3.0* 2.2* 2.3*    Recent Labs Lab 06/16/12 1815  LIPASE 20    Recent Labs Lab 06/15/12 2058  AMMONIA  55   Coagulation profile  Recent Labs Lab 06/15/12 1911  INR 1.06    CBC:  Recent Labs Lab 06/15/12 1911 06/16/12 0326 06/17/12 0654  WBC 6.3 4.5 3.8*  NEUTROABS 4.4  --   --   HGB 14.2 10.6* 11.9*  HCT 42.6 32.2* 36.3  MCV 92.2 92.3 94.0  PLT 204 123* 121*   Cardiac Enzymes:  Recent Labs Lab 06/15/12 2058  TROPONINI <0.30   BNP: No components found with this basename: POCBNP,  CBG: No results found for this basename: GLUCAP,  in the last 168 hours D-Dimer No results found for this basename: DDIMER,  in the last 72 hours Hgb A1c No results found for this basename: HGBA1C,  in the last 72 hours Lipid Profile No results found for this basename: CHOL, HDL,  LDLCALC, TRIG, CHOLHDL, LDLDIRECT,  in the last 72 hours Thyroid function studies No results found for this basename: TSH, T4TOTAL, FREET3, T3FREE, THYROIDAB,  in the last 72 hours Anemia work up No results found for this basename: VITAMINB12, FOLATE, FERRITIN, TIBC, IRON, RETICCTPCT,  in the last 72 hours Microbiology No results found for this or any previous visit (from the past 240 hour(s)).    Studies:  Ct Angio Chest Pe W/cm &/or Wo Cm  06/16/2012  *RADIOLOGY REPORT*  Clinical Data:  Shortness of breath and chest pain.  Weakness; right upper quadrant abdominal pain and back pain.  Blurred vision. History of metastatic breast cancer.  CT ANGIOGRAPHY CHEST CT ABDOMEN AND PELVIS WITH CONTRAST  Technique:  Multidetector CT imaging of the chest was performed using the standard protocol during bolus administration of intravenous contrast.  Multiplanar CT image reconstructions including MIPs were obtained to evaluate the vascular anatomy. Multidetector CT imaging of the abdomen and pelvis was performed using the standard protocol during bolus administration of intravenous contrast.  Contrast: OMNIPAQUE IOHEXOL 350 MG/ML SOLN  Comparison:   CTA of the chest performed 05/27/2012, and PET / CT performed 04/15/2012.  CTA CHEST  Findings:  There is no evidence of significant pulmonary embolus.  The lungs are essentially clear bilaterally.  A tiny 4 mm nodule within the left upper lobe (image 38 of 83) appears grossly stable from prior studies.  There is no evidence of significant focal consolidation, pleural effusion or pneumothorax.  No masses are identified; no abnormal focal contrast enhancement is seen.  A mildly enlarged right hilar node is seen, measuring 1.2 cm in short axis.  This has increased mildly in size from prior studies. There is no evidence of mediastinal lymphadenopathy.  No pericardial effusion is identified.  The great vessels are grossly unremarkable in appearance.  No axillary  lymphadenopathy is seen. The visualized portions of the thyroid gland are unremarkable in appearance.  Postoperative change is noted at the left breast.  A right-sided chest port is noted ending about the cavoatrial junction.  No acute osseous abnormalities are seen.   Review of the MIP images confirms the above findings.  IMPRESSION:  1.  No evidence of significant pulmonary embolus. 2.  Lungs essentially clear bilaterally. 3.  Mildly enlarged right hilar node, measuring 1.2 cm in short axis; this has increased mildly in size from prior studies, and is nonspecific.  CT ABDOMEN AND PELVIS  Findings: Innumerable metastatic lesions are noted throughout the liver, significantly worsened from prior studies.  This is most prominent along the caudate lobe, where a 6.4 cm mass abuts the lesser curvature of the stomach.  The patient is status  post resection of the left hepatic lobe.  The spleen is unremarkable in appearance.  The pancreas and adrenal glands are grossly unremarkable.  The patient is status post cholecystectomy, with clips noted at the gallbladder fossa.  There is slight asymmetric prominence of the right ureter along its entire course; this appears grossly stable from the prior PET / CT, and may reflect the patient's baseline.  Alternatively, mild ureteritis could have such an appearance.  A vague 1.6 cm linear focus of decreased attenuation at the interpole region of the right kidney is nonspecific but could reflect a small cyst or possibly minimal pyelonephritis.  It is not definitely characterized on the prior PET / CT.  A nonobstructing 3 mm stone is noted near the upper pole of the left kidney.  The kidneys are otherwise unremarkable in appearance.  The small bowel is unremarkable in appearance.  The stomach is within normal limits.  No acute vascular abnormalities are seen.  Note is made of a 1.2 cm omental mass anterior to the liver at the midline, apparently new from prior studies.  No additional  omental metastases are identified.  Trace free fluid is noted about the inferior tip of the liver, tracking to the right paracolic gutter. This may correspond to underlying omental disease.  The appendix is normal in caliber, without evidence for appendicitis.  The colon is largely decompressed and is unremarkable in appearance, aside from two apparent small diverticula along the sigmoid colon.  The bladder is mildly distended and grossly unremarkable.  A small amount of free fluid within the pelvic cul-de-sac is nonspecific in nature.  The ovaries are relatively symmetric.  No suspicious adnexal masses are seen.  The uterus is grossly unremarkable in appearance.  No inguinal lymphadenopathy is seen.  No acute osseous abnormalities are identified.  A 1.4 cm sclerotic focus within vertebral body L1 and sclerotic foci within the sacrum, are stable from 2013 and may reflect treated metastases. There is stable extensive sclerotic expansion of the sternum and manubrium.  Review of the MIP images confirms the above findings.  IMPRESSION:  1.  Worsening innumerable metastases noted throughout the liver; a 6.4 cm caudate lobe mass is seen abutting the lesser curvature of the stomach.  2.  Vague linear focus of decreased attenuation at the interpole region of the right kidney is nonspecific and could reflect very mild pyelonephritis or possibly a cyst.  Slight asymmetric prominence of the right ureter along its entire course appears grossly stable from the prior PET / CT and may reflect the patient's baseline, though mild ureteritis could have such an appearance. 3.  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  Trace free fluid about the inferior tip of the liver, tracking to the right paracolic gutter, is also new and may reflect underlying omental disease. 4.  3 mm nonobstructing stone near the upper pole of the left kidney. 5.  Stable extensive sclerotic expansion  of the sternum and manubrium, and sclerotic foci within the L1 and the sacrum, likely reflect treated disease.  No acute osseous abnormalities identified.   Original Report Authenticated By: Tonia Ghent, M.D.    Ct Abdomen Pelvis W Contrast  06/16/2012  *RADIOLOGY REPORT*  Clinical Data:  Shortness of breath and chest pain.  Weakness; right upper quadrant abdominal pain and back pain.  Blurred vision. History of metastatic breast cancer.  CT ANGIOGRAPHY CHEST CT ABDOMEN AND PELVIS WITH CONTRAST  Technique:  Multidetector CT imaging of the chest was performed using the standard protocol during bolus administration of intravenous contrast.  Multiplanar CT image reconstructions including MIPs were obtained to evaluate the vascular anatomy. Multidetector CT imaging of the abdomen and pelvis was performed using the standard protocol during bolus administration of intravenous contrast.  Contrast: OMNIPAQUE IOHEXOL 350 MG/ML SOLN  Comparison:   CTA of the chest performed 05/27/2012, and PET / CT performed 04/15/2012.  CTA CHEST  Findings:  There is no evidence of significant pulmonary embolus.  The lungs are essentially clear bilaterally.  A tiny 4 mm nodule within the left upper lobe (image 38 of 83) appears grossly stable from prior studies.  There is no evidence of significant focal consolidation, pleural effusion or pneumothorax.  No masses are identified; no abnormal focal contrast enhancement is seen.  A mildly enlarged right hilar node is seen, measuring 1.2 cm in short axis.  This has increased mildly in size from prior studies. There is no evidence of mediastinal lymphadenopathy.  No pericardial effusion is identified.  The great vessels are grossly unremarkable in appearance.  No axillary lymphadenopathy is seen. The visualized portions of the thyroid gland are unremarkable in appearance.  Postoperative change is noted at the left breast.  A right-sided chest port is noted ending about the cavoatrial  junction.  No acute osseous abnormalities are seen.   Review of the MIP images confirms the above findings.  IMPRESSION:  1.  No evidence of significant pulmonary embolus. 2.  Lungs essentially clear bilaterally. 3.  Mildly enlarged right hilar node, measuring 1.2 cm in short axis; this has increased mildly in size from prior studies, and is nonspecific.  CT ABDOMEN AND PELVIS  Findings: Innumerable metastatic lesions are noted throughout the liver, significantly worsened from prior studies.  This is most prominent along the caudate lobe, where a 6.4 cm mass abuts the lesser curvature of the stomach.  The patient is status post resection of the left hepatic lobe.  The spleen is unremarkable in appearance.  The pancreas and adrenal glands are grossly unremarkable.  The patient is status post cholecystectomy, with clips noted at the gallbladder fossa.  There is slight asymmetric prominence of the right ureter along its entire course; this appears grossly stable from the prior PET / CT, and may reflect the patient's baseline.  Alternatively, mild ureteritis could have such an appearance.  A vague 1.6 cm linear focus of decreased attenuation at the interpole region of the right kidney is nonspecific but could reflect a small cyst or possibly minimal pyelonephritis.  It is not definitely characterized on the prior PET / CT.  A nonobstructing 3 mm stone is noted near the upper pole of the left kidney.  The kidneys are otherwise unremarkable in appearance.  The small bowel is unremarkable in appearance.  The stomach is within normal limits.  No acute vascular abnormalities are seen.  Note is made of a 1.2 cm omental mass anterior to the liver at the midline, apparently new from prior studies.  No additional omental metastases are identified.  Trace free fluid is noted about the inferior tip of the liver, tracking to the right paracolic gutter. This may correspond to underlying omental disease.  The appendix is normal in  caliber, without evidence for appendicitis.  The colon is largely decompressed and is unremarkable in appearance, aside from two apparent small diverticula along the sigmoid colon.  The bladder is mildly distended and grossly unremarkable.  A small amount  of free fluid within the pelvic cul-de-sac is nonspecific in nature.  The ovaries are relatively symmetric.  No suspicious adnexal masses are seen.  The uterus is grossly unremarkable in appearance.  No inguinal lymphadenopathy is seen.  No acute osseous abnormalities are identified.  A 1.4 cm sclerotic focus within vertebral body L1 and sclerotic foci within the sacrum, are stable from 2013 and may reflect treated metastases. There is stable extensive sclerotic expansion of the sternum and manubrium.  Review of the MIP images confirms the above findings.  IMPRESSION:  1.  Worsening innumerable metastases noted throughout the liver; a 6.4 cm caudate lobe mass is seen abutting the lesser curvature of the stomach.  2.  Vague linear focus of decreased attenuation at the interpole region of the right kidney is nonspecific and could reflect very mild pyelonephritis or possibly a cyst.  Slight asymmetric prominence of the right ureter along its entire course appears grossly stable from the prior PET / CT and may reflect the patient's baseline, though mild ureteritis could have such an appearance. 3.  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  Trace free fluid about the inferior tip of the liver, tracking to the right paracolic gutter, is also new and may reflect underlying omental disease. 4.  3 mm nonobstructing stone near the upper pole of the left kidney. 5.  Stable extensive sclerotic expansion of the sternum and manubrium, and sclerotic foci within the L1 and the sacrum, likely reflect treated disease.  No acute osseous abnormalities identified.   Original Report Authenticated By: Tonia Ghent, M.D.      Assessment: 42 y.o. BRCA negative [place] woman with stage IV breast cancer involving liver and bone  (1) status post left lumpectomy and sentinel lymph node sampling 06/06/2002 for a pT1c pN0, stage IA invasive ductal carcinoma, high-grade, estrogen receptor and 96% and progesterone receptor 96% positive, with an MIB-1 of 32%, and HercepTest 3+ positive.  (2) status post adjuvant cyclophosphamide, epirubicin and fluorouracil x6  (3) completed adjuvant radiation to the left breast 01/01/2003  (4) completed 5 years of tamoxifen October of 2009  (5) participated in [place] study, receiving either lapatinib or placebo from 08/02/2005 to 04/19/2006  (6) elevated CA 27-29 led to studies April of 2012 documenting spread to bone, liver, and lymph nodes. Liver biopsy 08/05/2010 confirmed metastatic adenocarcinoma which was estrogen receptor positive at 75%, progesterone receptor positive at 12%, with an MIB-1 of 41% and HER-2/neu positive by CISH with a ratio of 4.11  (7) in the metastatic setting the patient has been treated as follows  (a) goserelin started 08/03/2010; most recent dose 04/26/2012  (b) denosumab started 08/03/2010; most recent dose 05/29/2012  (c) lapatinib between 08/11/2010 and 06/16/2011  (d) trastuzumab between 08/12/2010 and February 2013  (e) pertuzumab single-dose noted 01/13/2011  (f) capecitabine 03/28/2011 to 07/14/2011  (g) fulvestrant between 12/23/2010 and 10/27/2011  (h) TDM-1 06/23/2011 to 10/27/2011  (i) cyclophosphamide, methotrexate and fluorouracil x4, completed October 2013  (j) Abraxane x5 between November and December 2013  (k) Y-90 radioembolization of left hepatic artery 04/30/2012   Plan: Clay's calcium is now WNL and her creatinine clearance is normal. Her liver function tests remain elevated as expected. She complains of pain in the lower thoracic-upper lumbar spine but her L1 and sacral mets appear stable by repeat CT this admission. She may be able to  go home as early as tomorrow AM and in that case I  would not transfer her to [facility] but simply discharge--she already has appt w [place] this Friday to resume treatment with TDM-1 (I will cancel her studies planned for 3/12 in light of the scans she just had).  If her hospital stay is expected to extend beyond tomorrow please transfer the patient to [facility] and I will take over as attending at that point.  Thank you for your help to this patient!   MAGRINAT,GUSTAV C 06/17/2012

## 2012-06-17 NOTE — Progress Notes (Signed)
  Echocardiogram 2D Echocardiogram has been performed.  Cathie Beams 06/17/2012, 11:57 AM

## 2012-06-17 NOTE — Progress Notes (Signed)
Patient ID: Kathy Jimenez, female   DOB: Aug 17, 1970, 42 y.o.   MRN: [id number] Patient known to me s/p lap chole recently, events leading to admission noted.  She has otherwise done well with cholecystectomy and labs are consistent with met tumor.  Her incisions look well and are without infection.  She can do whatever she wants when released.  I told her I would cancel appt in my office and she can call if she needs anything.

## 2012-06-18 ENCOUNTER — Other Ambulatory Visit: Payer: Self-pay | Admitting: Oncology

## 2012-06-18 ENCOUNTER — Encounter (HOSPITAL_COMMUNITY): Payer: Self-pay | Admitting: General Practice

## 2012-06-18 DIAGNOSIS — C50919 Malignant neoplasm of unspecified site of unspecified female breast: Secondary | ICD-10-CM

## 2012-06-18 LAB — COMPREHENSIVE METABOLIC PANEL
AST: 376 U/L — ABNORMAL HIGH (ref 0–37)
Albumin: 2.1 g/dL — ABNORMAL LOW (ref 3.5–5.2)
Calcium: 8.5 mg/dL (ref 8.4–10.5)
Creatinine, Ser: 0.69 mg/dL (ref 0.50–1.10)
GFR calc non Af Amer: >90 mL/min (ref >90)

## 2012-06-18 LAB — CBC
HCT: 35.3 % — ABNORMAL LOW (ref 36.0–46.0)
MCV: 92.9 fL (ref 78.0–100.0)
Platelets: 129 10*3/uL — ABNORMAL LOW (ref 150–400)
RBC: 3.8 MIL/uL — ABNORMAL LOW (ref 3.87–5.11)
WBC: 4.4 10*3/uL (ref 4.0–10.5)

## 2012-06-18 MED ORDER — SENNA-DOCUSATE SODIUM 8.6-50 MG PO TABS: 1.0000 | ORAL_TABLET | Freq: Every day | ORAL | Status: AC | PRN

## 2012-06-18 MED ORDER — OXYCODONE HCL 5 MG PO TABS: 5.0000 mg | ORAL_TABLET | ORAL | Status: AC | PRN

## 2012-06-18 MED ORDER — HEPARIN SOD (PORK) LOCK FLUSH 100 UNIT/ML IV SOLN
500.0000 [IU] | INTRAVENOUS | Status: AC | PRN
  Administered 2012-06-18: 500 [IU]

## 2012-06-18 MED ORDER — SODIUM CHLORIDE 0.9 % IJ SOLN
10.0000 mL | INTRAMUSCULAR | Status: DC | PRN
  Administered 2012-06-18: 10 mL

## 2012-06-18 MED ORDER — ALUM & MAG HYDROXIDE-SIMETH 200-200-20 MG/5ML PO SUSP: 30.0000 mL | Freq: Four times a day (QID) | ORAL | Status: AC | PRN

## 2012-06-18 MED ORDER — ESOMEPRAZOLE MAGNESIUM 40 MG PO PACK: 40.0000 mg | PACK | Freq: Every day | ORAL | Status: AC

## 2012-06-18 MED ORDER — ONDANSETRON HCL 4 MG PO TABS: 4.0000 mg | ORAL_TABLET | ORAL | Status: AC | PRN

## 2012-06-18 NOTE — Discharge Summary (Signed)
Physician Discharge Summary  Kathy Jimenez ZOX:[id number] DOB: 1970-10-06 DOA: 06/15/2012  PCP: Gillermo Poch, MD  Admit date: 06/15/2012 Discharge date: 06/18/2012  Time spent: >45 minutes  Recommendations for Outpatient Follow-up:  1. F/u on blood cultures on Friday (oncology)  Discharge Diagnoses:  Principal Problem:   Hypercalcemia Active Problems:   Dehydration   Nausea   Abdominal pain   Breast cancer metastasized to multiple sites   Elevated transaminase level Chronic systolic heart failure  Discharge Condition: stable   Diet recommendation: as tolerated, add Ensure or Boost TID  Filed Weights   06/15/12 2007  Weight: 62.143 kg (137 lb)    History of present illness:  42 y.o. 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  2 weeks ago she had undergone a cholecystectomy due to Acalculus Cholecystitis, and prior to that she had radiation implants placed in the right lobe of her liver for treatment on January 29. 2014.  Due to her symptoms, she contacted her cardiologist and was strongly advised to report to the ED for evaluation. When she arrived she was found to have a systolic blood pressures in the 70s systolic. She was administered 2 liters of NSS and had improvement in her blood pressures to a systolic of 120. Her laboratory studies revealed hypercalcemia with a calcium level of 12.4 and her bun and cr were elevated compared to her baseline (Cr= 1.34 now and was 0.7)    Hospital Course:  Hypercalcemia  due to severe dehydration in setting of metastatic cancer - was given a dose of pamidronate by admitting physician - has been adequately volume resuscitated - calcium has now normalized and is 8.5  with and albumin of 2.1-   Hypotension / Severe DH  Clinically the patient's dehydration appears to have resolved - she remains somewhat hypotensive at this time - she denies orthostatic symptoms - she does admit to early fatigue with physical exertion but no focal weakness - we will continue to hold her beta blocker, ACE inhibitor, and Aldactone - given that her ejection fraction has recovered it is likely that she will not require these medications again (but we will leave this decision to her cardiologist)   Anorexia  The nurse confirms that the patient is eating almost nothing from her trays - we have discussed this at length with the patient - she denies intractable nausea vomiting or odynophagia - she does not wish to attempt appetite stimulants this at this time - Have recommended High caloric/protein shakes such Ensure or Boost at least 3 x day in addition to meals.   Fevers/ low grade Will obtain blood cultures today - will ask IV team to draw one set of cultures off of her port.   Cough Chronic- ongoing for 3 months.  May have been related to ACE I but she notes that it is quite severe at night.  Will start treating with Nexium and follow for improvement.   Back pain She has an old L1 lesion that is stable noted on CT imaging a few days ago.  At this point, no need for further imaging.   Recurrent metastatic breast CA  Will f/u with Oncology on Friday for chemo  I have communicated with Dr Arlice Colt that blood  cultures have been obtained today and will need to be followed on Friday prior to giving chemo.  Once set of cultures obtained from port.   Herceptin induced cardiomyopathy  Echocardiogram suggests the patient has recovered normal systolic function - heart failure team is following  Code Status: DNR   Consultants:  CHF Team  Oncology   Procedures:  3/10 - TTE - ejection fraction 55-60% with no focal wall motion abnormalities - wall thickness normal - systolic  function normal - cavity size normal   Subjective: Pt complaining of central/midline back pain today- this is mild/moderate She was given Dilaudid IV last night for pain and developed extensive vomiting for 2 hrs from this. Feeling much better today. Still urinating well despite poor PO intake. Extensive discussion had about low grade fevers, back pain, side effects of Dilaudid, her cough and poor PO intake.   Discharge Exam: Filed Vitals:   06/17/12 1342 06/17/12 2250 06/18/12 0600 06/18/12 1120  BP: 97/57 95/61 100/60   Pulse: 92 100 88   Temp: 98.7 F (37.1 C) 98.5 F (36.9 C) 99.3 F (37.4 C) 99.1 F (37.3 C)  TempSrc: Oral Oral Oral Oral  Resp: 20 16 18    Height:      Weight:      SpO2: 98% 100% 98%     General: alert, no distress  Cardiovascular: RRR, no murmurs Respiratory: CTA b/l  Back: - mild tenderness noted from mid thoracic spine down to upper lumbar area along the spine only.  Abd: soft, BS+, NT, ND    Discharge Instructions      Discharge Orders   Future Appointments [name] Department Dept Phone   06/21/2012 12:30 PM Beverely Pace [facility] [place] [facility] [phone]   06/21/2012 1:00 PM [facility] [place] [facility] [phone]   07/02/2012 8:00 AM [facility] [phone]   07/12/2012 8:00 AM Lowella Dell, MD [facility] MEDICAL ONCOLOGY [phone]   07/12/2012 8:30 AM Dava Najjar Idelle Jo [facility] [phone]   07/12/2012 9:00 AM [facility] [place] [facility] [phone]   08/02/2012 2:30 PM Krista Blue [facility] [phone]   08/02/2012 3:00 PM [facility] [place] [facility] [phone]   08/23/2012 2:30 PM Windell Hummingbird [facility] [phone]   08/23/2012 3:00 PM [facility] [phone]   Future Orders Complete By Expires     Discharge instructions  As directed     Comments:      Oxycodone can cause constipation- if this occurs take colace/senna tabs- if it occurs frequently, begin taking 1-2 tabs on a daily basis. Remember to drink plenty of liquids and drink 2-3 ensure or boosts daily in between meals.    Increase activity slowly  As directed         Medication List    STOP taking these medications       carvedilol 6.25 MG tablet  Commonly known as:  COREG     CYTRA K CRYSTALS 3300-1002 MG Pack  Generic drug:  Potassium Citrate-Citric Acid     lisinopril 10 MG tablet  Commonly known as:  PRINIVIL,ZESTRIL     spironolactone 25 MG tablet  Commonly known as:  ALDACTONE      TAKE these medications       alum & mag  hydroxide-simeth 200-200-20 MG/5ML suspension  Commonly known as:  MAALOX/MYLANTA  Take 30 mLs by mouth every 6 (six) hours as needed.     esomeprazole 40 MG packet  Commonly known as:  NEXIUM  Take 40 mg by mouth daily before breakfast.     lidocaine-prilocaine cream  Commonly known as:  EMLA  Apply 1 application topically daily as needed. For port-a-cath when receiving chemo     ondansetron 4 MG tablet  Commonly known as:  ZOFRAN  Take 1 tablet (4 mg total) by mouth every 4 (four) hours as needed for nausea.     oxyCODONE 5 MG immediate release tablet  Commonly known as:  Oxy IR/ROXICODONE  Take 1-2 tablets (5-10 mg total) by mouth every 3 (three) hours as needed.     scopolamine 1.5 MG  Commonly known as:  TRANSDERM-SCOP  Place 1 patch onto the skin every 3 (three) days as needed. For motion sickness when traveling     sennosides-docusate sodium 8.6-50 MG tablet  Commonly known as:  SENOKOT-S  Take 1-2 tablets by mouth daily as needed for constipation.          The results of significant diagnostics from this hospitalization (including imaging, microbiology, ancillary and  laboratory) are listed below for reference.    Significant Diagnostic Studies: Ct Angio Chest Pe W/cm &/or Wo Cm  06/16/2012  *RADIOLOGY REPORT*  Clinical Data:  Shortness of breath and chest pain.  Weakness; right upper quadrant abdominal pain and back pain.  Blurred vision. History of metastatic breast cancer.  CT ANGIOGRAPHY CHEST CT ABDOMEN AND PELVIS WITH CONTRAST  Technique:  Multidetector CT imaging of the chest was performed using the standard protocol during bolus administration of intravenous contrast.  Multiplanar CT image reconstructions including MIPs were obtained to evaluate the vascular anatomy. Multidetector CT imaging of the abdomen and pelvis was performed using the standard protocol during bolus administration of intravenous contrast.  Contrast: OMNIPAQUE IOHEXOL 350 MG/ML SOLN  Comparison:   CTA of the chest performed 05/27/2012, and PET / CT performed 04/15/2012.  CTA CHEST  Findings:  There is no evidence of significant pulmonary embolus.  The lungs are essentially clear bilaterally.  A tiny 4 mm nodule within the left upper lobe (image 38 of 83) appears grossly stable from prior studies.  There is no evidence of significant focal consolidation, pleural effusion or pneumothorax.  No masses are identified; no abnormal focal contrast enhancement is seen.  A mildly enlarged right hilar node is seen, measuring 1.2 cm in short axis.  This has increased mildly in size from prior studies. There is no evidence of mediastinal lymphadenopathy.  No pericardial effusion is identified.  The great vessels are grossly unremarkable in appearance.  No axillary lymphadenopathy is seen. The visualized portions of the thyroid gland are unremarkable in appearance.  Postoperative change is noted at the left breast.  A right-sided chest port is noted ending about the cavoatrial junction.  No acute osseous abnormalities are seen.   Review of the MIP images confirms the above findings.  IMPRESSION:  1.  No  evidence of significant pulmonary embolus. 2.  Lungs essentially clear bilaterally. 3.  Mildly enlarged right hilar node, measuring 1.2 cm in short axis; this has increased mildly in size from prior studies, and is nonspecific.  CT ABDOMEN AND PELVIS  Findings: Innumerable metastatic lesions are noted throughout the liver, significantly worsened from prior studies.  This is most prominent along the caudate lobe, where a 6.4  cm mass abuts the lesser curvature of the stomach.  The patient is status post resection of the left hepatic lobe.  The spleen is unremarkable in appearance.  The pancreas and adrenal glands are grossly unremarkable.  The patient is status post cholecystectomy, with clips noted at the gallbladder fossa.  There is slight asymmetric prominence of the right ureter along its entire course; this appears grossly stable from the prior PET / CT, and may reflect the patient's baseline.  Alternatively, mild ureteritis could have such an appearance.  A vague 1.6 cm linear focus of decreased attenuation at the interpole region of the right kidney is nonspecific but could reflect a small cyst or possibly minimal pyelonephritis.  It is not definitely characterized on the prior PET / CT.  A nonobstructing 3 mm stone is noted near the upper pole of the left kidney.  The kidneys are otherwise unremarkable in appearance.  The small bowel is unremarkable in appearance.  The stomach is within normal limits.  No acute vascular abnormalities are seen.  Note is made of a 1.2 cm omental mass anterior to the liver at the midline, apparently new from prior studies.  No additional omental metastases are identified.  Trace free fluid is noted about the inferior tip of the liver, tracking to the right paracolic gutter. This may correspond to underlying omental disease.  The appendix is normal in caliber, without evidence for appendicitis.  The colon is largely decompressed and is unremarkable in appearance, aside from two  apparent small diverticula along the sigmoid colon.  The bladder is mildly distended and grossly unremarkable.  A small amount of free fluid within the pelvic cul-de-sac is nonspecific in nature.  The ovaries are relatively symmetric.  No suspicious adnexal masses are seen.  The uterus is grossly unremarkable in appearance.  No inguinal lymphadenopathy is seen.  No acute osseous abnormalities are identified.  A 1.4 cm sclerotic focus within vertebral body L1 and sclerotic foci within the sacrum, are stable from 2013 and may reflect treated metastases. There is stable extensive sclerotic expansion of the sternum and manubrium.  Review of the MIP images confirms the above findings.  IMPRESSION:  1.  Worsening innumerable metastases noted throughout the liver; a 6.4 cm caudate lobe mass is seen abutting the lesser curvature of the stomach.  2.  Vague linear focus of decreased attenuation at the interpole region of the right kidney is nonspecific and could reflect very mild pyelonephritis or possibly a cyst.  Slight asymmetric prominence of the right ureter along its entire course appears grossly stable from the prior PET / CT and may reflect the patient's baseline, though mild ureteritis could have such an appearance. 3.  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  Trace free fluid about the inferior tip of the liver, tracking to the right paracolic gutter, is also new and may reflect underlying omental disease. 4.  3 mm nonobstructing stone near the upper pole of the left kidney. 5.  Stable extensive sclerotic expansion of the sternum and manubrium, and sclerotic foci within the L1 and the sacrum, likely reflect treated disease.  No acute osseous abnormalities identified.   Original Report Authenticated By: Tonia Ghent, M.D.    Ct Angio Chest Pe W/cm &/or Wo Cm  05/27/2012  *RADIOLOGY REPORT*  Clinical Data: Right chest and back pain.  Metastatic breast  carcinoma. Liver radioembolization 04/30/2012.  CT ANGIOGRAPHY CHEST  Technique:  Multidetector CT  imaging of the chest using the standard protocol during bolus administration of intravenous contrast. Multiplanar reconstructed images including MIPs were obtained and reviewed to evaluate the vascular anatomy.  Contrast: 60mL OMNIPAQUE IOHEXOL 350 MG/ML SOLN  Comparison: PET CT 04/15/2012  Findings: There is good contrast opacification of the pulmonary artery branches.  No discrete filling defect to suggest acute PE.Adequate contrast opacification of the thoracic aorta with no evidence of dissection, aneurysm, or stenosis. There is classic 3- vessel brachiocephalic arch anatomy.  No pleural or pericardial effusion.  Right IJ port catheter extends to the proximal right atrium.  No hilar or mediastinal adenopathy.  Innumerable low attenuation liver lesions, much more extensive and apparent than on prior PET CT, possibly related to interval radioembolization versus significant progression of hepatic metastatic disease. Embolization coils noted in the upper abdomen.  Lungs are clear. Minimal dependent atelectasis posteriorly in the lower lobes. 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  Thoracic spine intact.  IMPRESSION: 1.  Negative for acute PE or thoracic aortic dissection. 2. Patchy nodular liver parenchymal changes may represent sequelae of recent radioembolization versus progression of hepatic metastatic disease.   Original Report Authenticated By: D. Andria Rhein, MD    Nm Hepatobiliary Liver Func  06/07/2012  **ADDENDUM** CREATED: 06/07/2012 12:18:53  The curvilinear activity projecting over the epigastric region is likely technetium within the port access tubing.  This is not  Y 90 activity as exam was performed 28 days post Y-90  therapy (greater than 10 half lives).  **END ADDENDUM** SIGNED BY: Genevive Bi, M.D.   05/28/2012  *RADIOLOGY REPORT*  Clinical Data:  42 year old female with metastatic  breast cancer. Status post Y 90 micro sphere treatment of liver metastases January 2014.  Right upper quadrant pain and nausea.  Abnormal gallbladder on recent ultrasound.  NUCLEAR MEDICINE HEPATOBILIARY IMAGING  Technique:  Sequential images of the abdomen were obtained out to 60 minutes following intravenous administration of radiopharmaceutical.  Radiopharmaceutical:  5.51mCi Tc-71m Choletec  Comparison:  Abdominal ultrasound 05/27/2012.  Chest CT 11/10/2015 14.  Findings: Liver uptake of the radiotracer is mildly heterogeneous, the blood pool clears relatively quickly.  There is curvilinear activity at the level of the diaphragm, present on the first image and unchanged throughout the study. Legrand Rams this reflects a non target embolization related to the January Y 90 therapy.  There is prompt activity in the CBD and small bowel.  Small bowel activity increases, but after 2 hours of imaging no convincing gallbladder activity is identified.  IMPRESSION: No gallbladder filling.  CBD is patent.  Extra-hepatic activity which is unchanged throughout the study probably reflects diaphragm activity from non-target Y90 embolization in January.  Study reviewed with Dr. Irish Lack.  Original Report Authenticated By: Erskine Speed, M.D.    US Abdomen Complete  05/27/2012  *RADIOLOGY REPORT*  Clinical Data:  Nausea and right upper quadrant abdominal pain. Metastatic breast cancer.  COMPLETE ABDOMINAL ULTRASOUND  Comparison:  Chest CTA 05/27/2012.  PET CT 04/15/2012.  Findings:  Gallbladder: Contains multiple small shadowing gallstones.  There is gallbladder wall thickening to 3.7 mm.  Sonographer reports a positive sonographic Murphy's sign.  Common bile duct:   Not well visualized, although without evidence of significant dilatation.  Liver:  The liver is diffusely replaced by widespread metastatic disease as demonstrated on the earlier CT.  There are probable nodal metastases within the porta caval as well.  IVC:   Visualized portions appear unremarkable.  Pancreas:  Visualized portions appear unremarkable.  Spleen:  Visualized portions appear unremarkable.  Right Kidney:   The renal cortical thickness and echogenicity are preserved.  There is no hydronephrosis or focal abnormality. Renal length is 10.7 cm.  Left Kidney:   The renal cortical thickness and echogenicity are preserved.  There is no hydronephrosis or focal abnormality. Renal length is 10.2 cm.  Abdominal aorta:  Visualized portions appear unremarkable.  IMPRESSION:  1.  Widespread hepatic metastatic disease. 2.  Gallstones and gallbladder wall thickening with positive sonographic Murphy's sign.  Findings suspicious for cholecystitis. No significant biliary dilatation identified.   Original Report Authenticated By: Carey Bullocks, M.D.    Ct Abdomen Pelvis W Contrast  06/16/2012  *RADIOLOGY REPORT*  Clinical Data:  Shortness of breath and chest pain.  Weakness; right upper quadrant abdominal pain and back pain.  Blurred vision. History of metastatic breast cancer.  CT ANGIOGRAPHY CHEST CT ABDOMEN AND PELVIS WITH CONTRAST  Technique:  Multidetector CT imaging of the chest was performed using the standard protocol during bolus administration of intravenous contrast.  Multiplanar CT image reconstructions including MIPs were obtained to evaluate the vascular anatomy. Multidetector CT imaging of the abdomen and pelvis was performed using the standard protocol during bolus administration of intravenous contrast.  Contrast: OMNIPAQUE IOHEXOL 350 MG/ML SOLN  Comparison:   CTA of the chest performed 05/27/2012, and PET / CT performed 04/15/2012.  CTA CHEST  Findings:  There is no evidence of significant pulmonary embolus.  The lungs are essentially clear bilaterally.  A tiny 4 mm nodule within the left upper lobe (image 38 of 83) appears grossly stable from prior studies.  There is no evidence of significant focal consolidation, pleural effusion or pneumothorax.  No  masses are identified; no abnormal focal contrast enhancement is seen.  A mildly enlarged right hilar node is seen, measuring 1.2 cm in short axis.  This has increased mildly in size from prior studies. There is no evidence of mediastinal lymphadenopathy.  No pericardial effusion is identified.  The great vessels are grossly unremarkable in appearance.  No axillary lymphadenopathy is seen. The visualized portions of the thyroid gland are unremarkable in appearance.  Postoperative change is noted at the left breast.  A right-sided chest port is noted ending about the cavoatrial junction.  No acute osseous abnormalities are seen.   Review of the MIP images confirms the above findings.  IMPRESSION:  1.  No evidence of significant pulmonary embolus. 2.  Lungs essentially clear bilaterally. 3.  Mildly enlarged right hilar node, measuring 1.2 cm in short axis; this has increased mildly in size from prior studies, and is nonspecific.  CT ABDOMEN AND PELVIS  Findings: Innumerable metastatic lesions are noted throughout the liver, significantly worsened from prior studies.  This is most prominent along the caudate lobe, where a 6.4 cm mass abuts the lesser curvature of the stomach.  The patient is status post resection of the left hepatic lobe.  The spleen is unremarkable in appearance.  The pancreas and adrenal glands are grossly unremarkable.  The patient is status post cholecystectomy, with clips noted at the gallbladder fossa.  There is slight asymmetric prominence of the right ureter along its entire course; this appears grossly stable from the prior PET / CT, and may reflect the patient's baseline.  Alternatively, mild ureteritis could have such an appearance.  A vague 1.6 cm linear focus of decreased attenuation at the interpole region of the right kidney is nonspecific but could reflect a small cyst or possibly minimal pyelonephritis.  It is not definitely characterized on the prior PET / CT.  A nonobstructing 3 mm  stone is noted near the upper pole of the left kidney.  The kidneys are otherwise unremarkable in appearance.  The small bowel is unremarkable in appearance.  The stomach is within normal limits.  No acute vascular abnormalities are seen.  Note is made of a 1.2 cm omental mass anterior to the liver at the midline, apparently new from prior studies.  No additional omental metastases are identified.  Trace free fluid is noted about the inferior tip of the liver, tracking to the right paracolic gutter. This may correspond to underlying omental disease.  The appendix is normal in caliber, without evidence for appendicitis.  The colon is largely decompressed and is unremarkable in appearance, aside from two apparent small diverticula along the sigmoid colon.  The bladder is mildly distended and grossly unremarkable.  A small amount of free fluid within the pelvic cul-de-sac is nonspecific in nature.  The ovaries are relatively symmetric.  No suspicious adnexal masses are seen.  The uterus is grossly unremarkable in appearance.  No inguinal lymphadenopathy is seen.  No acute osseous abnormalities are identified.  A 1.4 cm sclerotic focus within vertebral body L1 and sclerotic foci within the sacrum, are stable from 2013 and may reflect treated metastases. There is stable extensive sclerotic expansion of the sternum and manubrium.  Review of the MIP images confirms the above findings.  IMPRESSION:  1.  Worsening innumerable metastases noted throughout the liver; a 6.4 cm caudate lobe mass is seen abutting the lesser curvature of the stomach.  2.  Vague linear focus of decreased attenuation at the interpole region of the right kidney is nonspecific and could reflect very mild pyelonephritis or possibly a cyst.  Slight asymmetric prominence of the right ureter along its entire course appears grossly stable from the prior PET / CT and may reflect the patient's baseline, though mild ureteritis could have such an appearance. 3.   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  Trace free fluid about the inferior tip of the liver, tracking to the right paracolic gutter, is also new and may reflect underlying omental disease. 4.  3 mm nonobstructing stone near the upper pole of the left kidney. 5.  Stable extensive sclerotic expansion of the sternum and manubrium, and sclerotic foci within the L1 and the sacrum, likely reflect treated disease.  No acute osseous abnormalities identified.   Original Report Authenticated By: Tonia Ghent, M.D.     Microbiology: Recent Results (from the past 240 hour(s))  CULTURE, BLOOD (ROUTINE X 2)     Status: None   Collection Time    06/15/12  9:16 PM      Result Value Range Status   Specimen Description BLOOD RIGHT ARM   Final   Special Requests BOTTLES DRAWN AEROBIC AND ANAEROBIC 10CC EA   Final   Culture  Setup Time 06/16/2012 15:55   Final   Culture     Final   Value:        BLOOD CULTURE RECEIVED NO GROWTH TO DATE CULTURE WILL BE HELD FOR 5 DAYS BEFORE ISSUING A FINAL NEGATIVE REPORT   Report Status PENDING   Incomplete  CULTURE, BLOOD (ROUTINE X 2)     Status: None   Collection Time    06/15/12  9:45 PM      Result Value Range Status   Specimen Description  BLOOD RIGHT HAND   Final   Special Requests BOTTLES DRAWN AEROBIC ONLY 0.5CC   Final   Culture  Setup Time 06/16/2012 15:55   Final   Culture     Final   Value:        BLOOD CULTURE RECEIVED NO GROWTH TO DATE CULTURE WILL BE HELD FOR 5 DAYS BEFORE ISSUING A FINAL NEGATIVE REPORT   Report Status PENDING   Incomplete     Labs: Basic Metabolic Panel:  Recent Labs Lab 06/15/12 1911 06/16/12 0326 06/17/12 0654 06/18/12 0500  NA 139 134* 136 139  K 4.2 3.8 3.8 3.8  CL 97 100 102 104  CO2 28 23 25 24   GLUCOSE 128* 93 79 76  BUN 25* 22 14 10   CREATININE 1.34* 1.00 0.88 0.69  CALCIUM 12.4* 9.5 9.0 8.5   Liver Function Tests:  Recent Labs Lab 06/15/12 1911  06/16/12 0326 06/17/12 0654 06/18/12 0500  AST 370* 297* 373* 376*  ALT 135* 105* 123* 113*  ALKPHOS 826* 613* 735* 737*  BILITOT 0.8 0.8 0.8 1.1  PROT 7.3 5.3* 5.8* 5.2*  ALBUMIN 3.0* 2.2* 2.3* 2.1*    Recent Labs Lab 06/16/12 1815  LIPASE 20    Recent Labs Lab 06/15/12 2058 06/18/12 0500  AMMONIA 55 50   CBC:  Recent Labs Lab 06/15/12 1911 06/16/12 0326 06/17/12 0654 06/18/12 1228  WBC 6.3 4.5 3.8* 4.4  NEUTROABS 4.4  --   --   --   HGB 14.2 10.6* 11.9* 11.6*  HCT 42.6 32.2* 36.3 35.3*  MCV 92.2 92.3 94.0 92.9  PLT 204 123* 121* 129*   Cardiac Enzymes:  Recent Labs Lab 06/15/12 2058  TROPONINI <0.30   BNP: BNP (last 3 results)  Recent Labs  06/15/12 1911  PROBNP 202.8*   CBG: No results found for this basename: GLUCAP,  in the last 168 hours     Signed:  RIZWAN,SAIMA  [facility] 06/18/2012, 2:39 PM

## 2012-06-18 NOTE — Progress Notes (Signed)
[facility] on call notified  At approx. 2250 that pt was vomiting Zofran 4mg  IV had been administered with 1 mg of dilaudid for pain with not relief for the nausea and vomiting. New orders received and administered with relief will continue to monitor. Ilean Skill LPN

## 2012-06-18 NOTE — Progress Notes (Signed)
In to see pt for energy conservation and possible AE use. Pt walking around unit with family upon my arrival looking very good. Talked with pt briefly about energy conservation techniques that the OT yesterday had gone over with her and then gave her our energy conservation handout. Pt moving really well now and she nor I see any need for AE. No further OT needs, will sign off.  Ignacia Palma, OTR/L [phone] 06/18/2012

## 2012-06-19 ENCOUNTER — Other Ambulatory Visit: Payer: Self-pay | Admitting: Oncology

## 2012-06-19 ENCOUNTER — Ambulatory Visit (HOSPITAL_COMMUNITY): Admission: RE | Admit: 2012-06-19 | Payer: BC Managed Care – PPO | Source: Ambulatory Visit

## 2012-06-20 ENCOUNTER — Other Ambulatory Visit: Payer: Self-pay | Admitting: *Deleted

## 2012-06-20 ENCOUNTER — Other Ambulatory Visit: Payer: Self-pay | Admitting: Oncology

## 2012-06-20 NOTE — Progress Notes (Signed)
Pt called to this RN to state she will not be seen at [facility] with Dr Amie Critchley until Monday 3/17 in the am.  [name] is inquiring about appointments for tomorrow - " does Dr Darnelle Catalan want to see me then ".  This RN reviewed above with MD and called pt with his recommendation- best to hold treatment tomorrow until consultation at [facility]. If she does not proceed with treatment at [facility] he would want to proceed with planned therapy next week.  MD can see pt for treatment decision Monday at 5pm.  This RN called pt and discussed the above- appts cancelled for tomorrow, scheduled late MD for 3/17,and requested rescheduling of IV therapy.

## 2012-06-21 ENCOUNTER — Encounter (INDEPENDENT_AMBULATORY_CARE_PROVIDER_SITE_OTHER): Payer: BC Managed Care – PPO | Admitting: General Surgery

## 2012-06-21 ENCOUNTER — Telehealth: Payer: Self-pay | Admitting: *Deleted

## 2012-06-21 ENCOUNTER — Other Ambulatory Visit: Payer: Self-pay | Admitting: Lab

## 2012-06-21 ENCOUNTER — Ambulatory Visit: Payer: Self-pay

## 2012-06-21 NOTE — Telephone Encounter (Signed)
This RN received call from pt stating per her communication with Morrie Sheldon with Dr Kandice Hams at [facility] - additional data needs to be sent. Per pt - she was told pathology and images have not been received as well as pt will need to take disc of images for review.  This RN called to radiology and spoke with Deidre- requested images of post 6 months.  This RN faxed images of post 6 months and pathology reports to given fax number of [phone].  Note on fax cover sheet this RN's name with direct call number given for additional communication if needed.

## 2012-06-22 ENCOUNTER — Telehealth: Payer: Self-pay | Admitting: Oncology

## 2012-06-22 NOTE — Telephone Encounter (Signed)
Sent tanya brown a staff message to add a chemo appt in on 3/18/ or 06/26/2012.

## 2012-06-24 ENCOUNTER — Ambulatory Visit: Payer: Self-pay | Admitting: Oncology

## 2012-06-24 ENCOUNTER — Telehealth: Payer: Self-pay | Admitting: *Deleted

## 2012-06-24 LAB — CULTURE, BLOOD (ROUTINE X 2)
Culture: NO GROWTH
Culture: NO GROWTH

## 2012-06-24 NOTE — Telephone Encounter (Signed)
Message received at approximately 445pm from " Raiford Noble " stating pt would be able to make appointment for 5pm with MD "  This RN informed MD and will contact pt in am for update and plan related to visit today at [facility].

## 2012-06-25 ENCOUNTER — Other Ambulatory Visit: Payer: Self-pay | Admitting: Oncology

## 2012-06-25 ENCOUNTER — Other Ambulatory Visit: Payer: Self-pay | Admitting: *Deleted

## 2012-06-25 LAB — CULTURE, BLOOD (ROUTINE X 2): Culture: NO GROWTH

## 2012-06-25 NOTE — Telephone Encounter (Signed)
This RN called to pt's cell number for follow up post call yesterday cancelling MD appt.  Spoke with "Raiford Noble" who states pt will be returning to [facility] tomorrow for another visit for treatment decisions.  Per conversation Raiford Noble verbalized understanding for [name] to contact us with treatment decisions for appropriate coordination of care.

## 2012-06-26 ENCOUNTER — Telehealth: Payer: Self-pay | Admitting: Oncology

## 2012-06-26 ENCOUNTER — Other Ambulatory Visit: Payer: Self-pay | Admitting: Oncology

## 2012-06-26 ENCOUNTER — Ambulatory Visit: Payer: Self-pay

## 2012-06-26 NOTE — Progress Notes (Signed)
Called Clay last week, left my cell # for her to call back; she supposedly went to [facility] 3/17--they usually call when they see one of my patients but no call this time; moved her treatment to today; per RN, she will be going back to [facility] today and is "not doing well." I called again today, left a message in Clay's cell asking her to let me know how she is doing and what she is thinking at this point

## 2012-06-26 NOTE — Telephone Encounter (Signed)
LMONVM ADVIISNG THE PT OF HER CHEMO APPT TODAY@3 :45PM

## 2012-06-27 ENCOUNTER — Encounter: Payer: Self-pay | Admitting: Oncology

## 2012-06-27 ENCOUNTER — Other Ambulatory Visit: Payer: Self-pay | Admitting: Oncology

## 2012-06-27 NOTE — Progress Notes (Signed)
I was called today by Dr. Jamie Brookes to let me know that clay has decided to enroll in hospice. She has chosen to have Dr. Alphonsus Sias, the hospice medical director, be her hospice associated physician. He told me she wishes to have all of visits and labs and treatments here at canceled since she is expected to expire in the near future. I let him know that we wish her the best and we are available if we can be of help in any way.

## 2012-07-02 ENCOUNTER — Inpatient Hospital Stay: Admission: RE | Admit: 2012-07-02 | Payer: Self-pay | Source: Ambulatory Visit

## 2012-07-03 ENCOUNTER — Other Ambulatory Visit: Payer: Self-pay | Admitting: Oncology

## 2012-07-09 DEATH — deceased

## 2012-07-12 ENCOUNTER — Other Ambulatory Visit: Payer: Self-pay | Admitting: Lab

## 2012-07-12 ENCOUNTER — Ambulatory Visit: Payer: Self-pay

## 2012-07-12 ENCOUNTER — Ambulatory Visit: Payer: Self-pay | Admitting: Oncology

## 2012-08-02 ENCOUNTER — Other Ambulatory Visit: Payer: Self-pay | Admitting: Lab

## 2012-08-02 ENCOUNTER — Ambulatory Visit: Payer: Self-pay

## 2012-08-23 ENCOUNTER — Ambulatory Visit: Payer: Self-pay

## 2012-08-23 ENCOUNTER — Other Ambulatory Visit: Payer: Self-pay | Admitting: Lab

## 2013-06-02 IMAGING — CT CT ANGIO CHEST
1 of 2 series · 19 of 32 positions shown · IV contrast (omnipaque)
Comparison: PET CT 04/15/2012

CLINICAL DATA: Right chest and back pain.  Metastatic breast
carcinoma. Liver radioembolization 04/30/2012.

CT ANGIOGRAPHY CHEST
TECHNIQUE: Multidetector CT imaging of the chest using the
standard protocol during bolus administration of intravenous
contrast. Multiplanar reconstructed images including MIPs were
obtained and reviewed to evaluate the vascular anatomy.
Contrast: 60mL OMNIPAQUE IOHEXOL 350 MG/ML SOLN

[Series 10: thins for pacs · axial · 0.74mm/px · z∈[+1380,+1594]mm · 19 of 238 slices shown]
[im 12/238  lung]
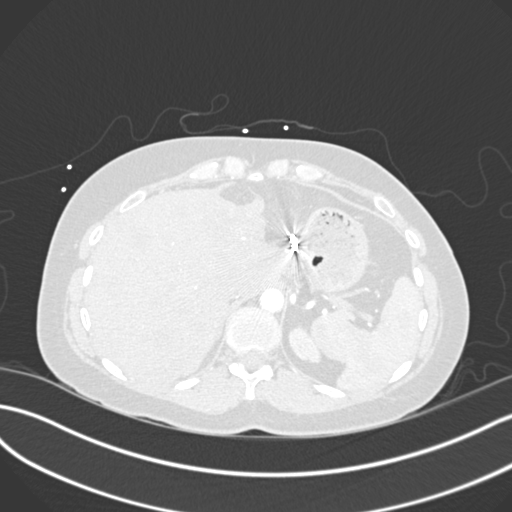
[im 24/238  mediastinal]
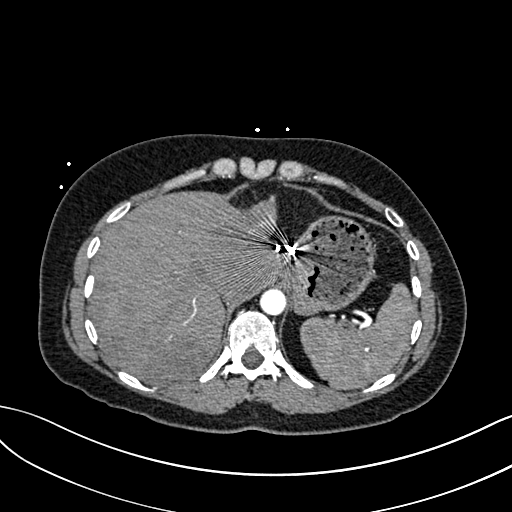
[im 36/238  lung]
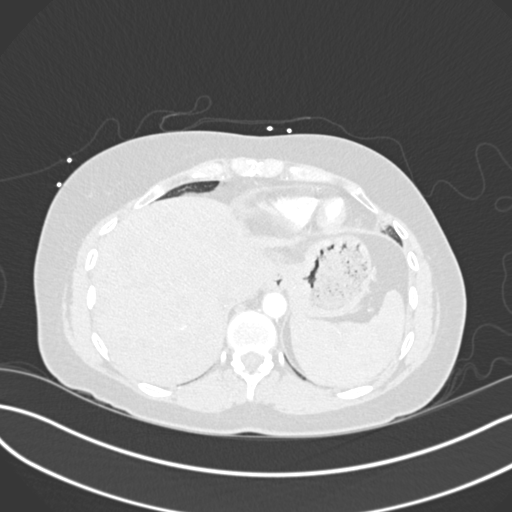
[im 60/238  mediastinal]
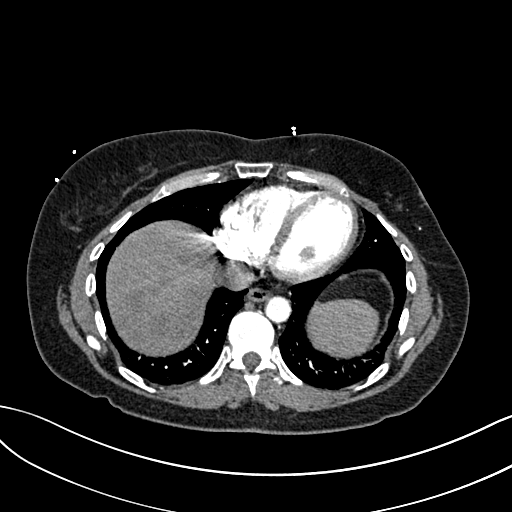
[im 72/238  lung]
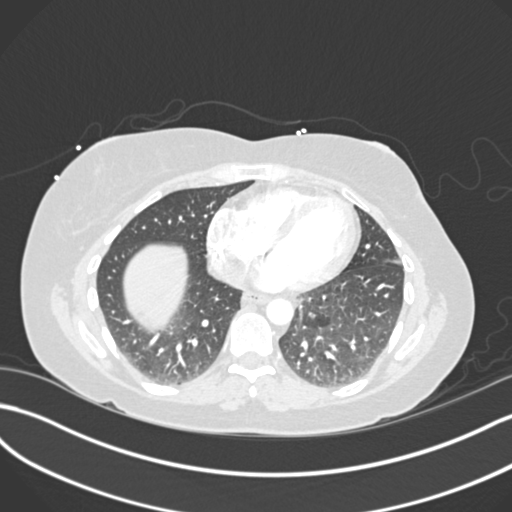
[im 80/238  mediastinal]
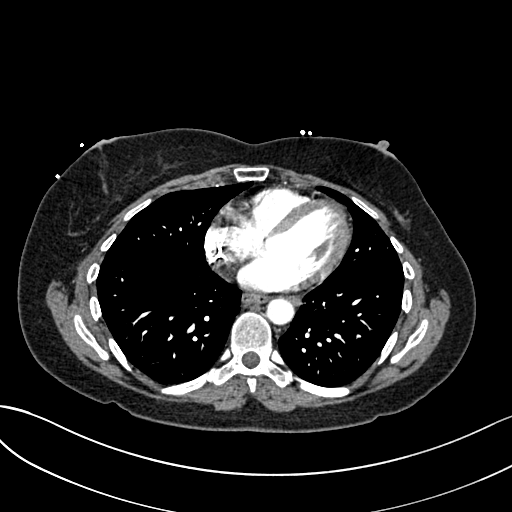
[im 83/238  lung]
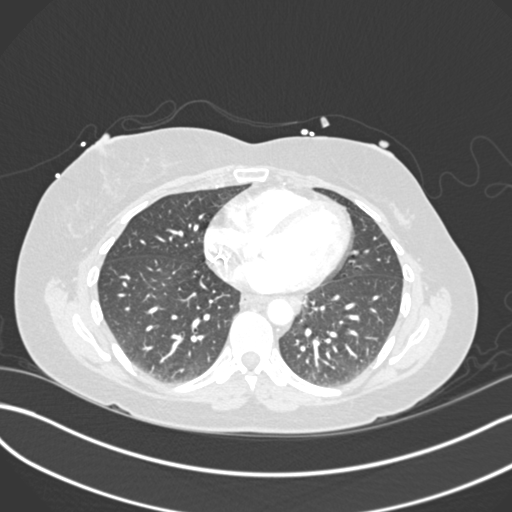
[im 95/238  mediastinal]
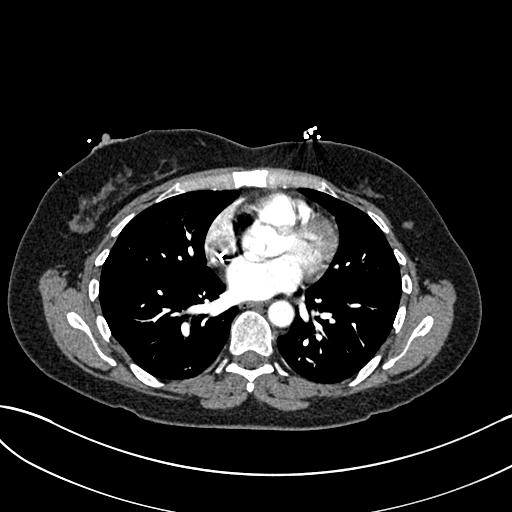
[im 107/238  lung]
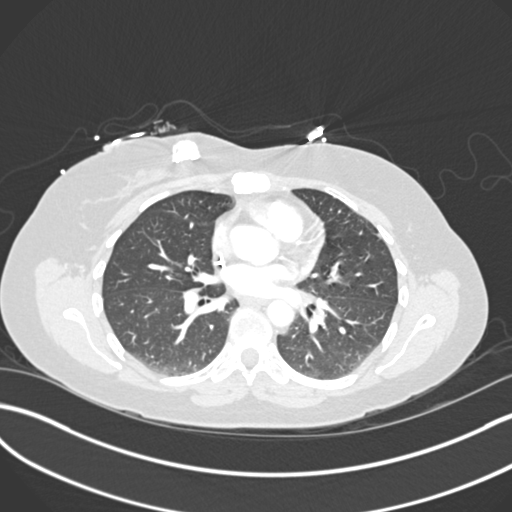
[im 119/238  mediastinal]
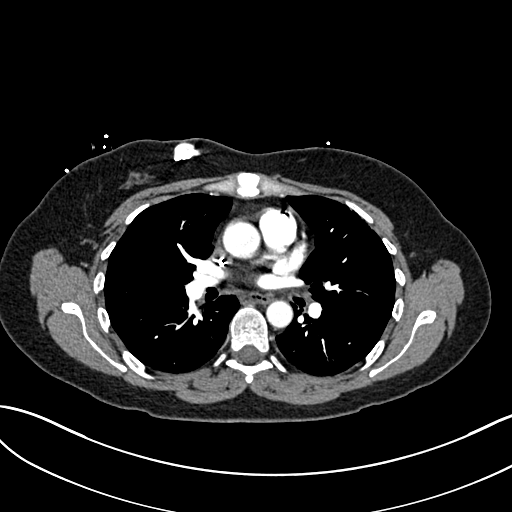
[im 131/238  lung]
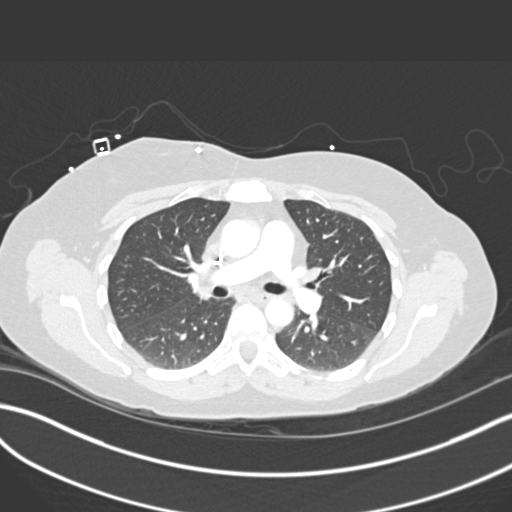
[im 143/238  mediastinal]
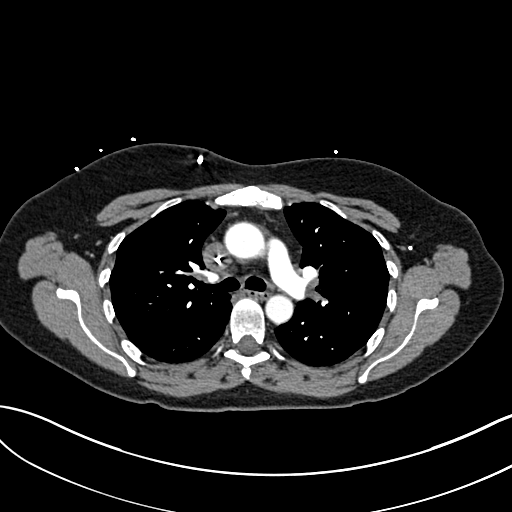
[im 155/238  lung]
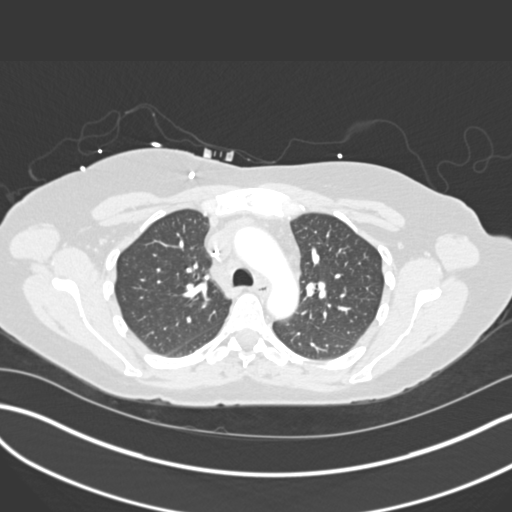
[im 159/238  mediastinal]
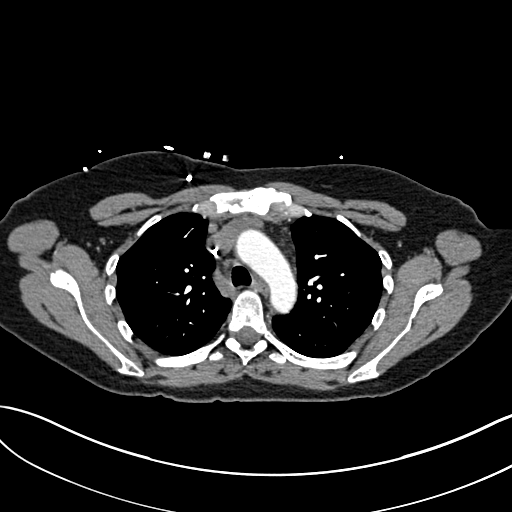
[im 166/238  lung]
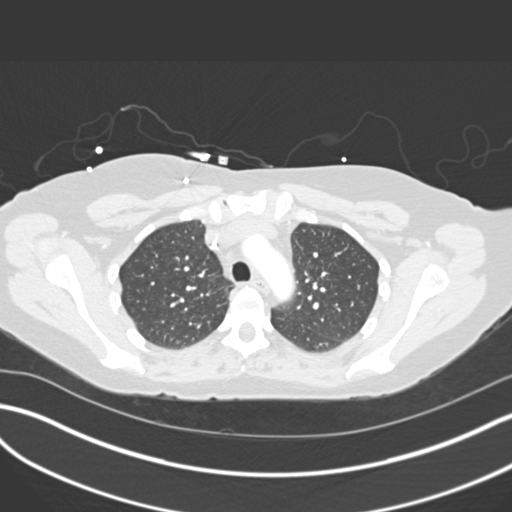
[im 178/238  mediastinal]
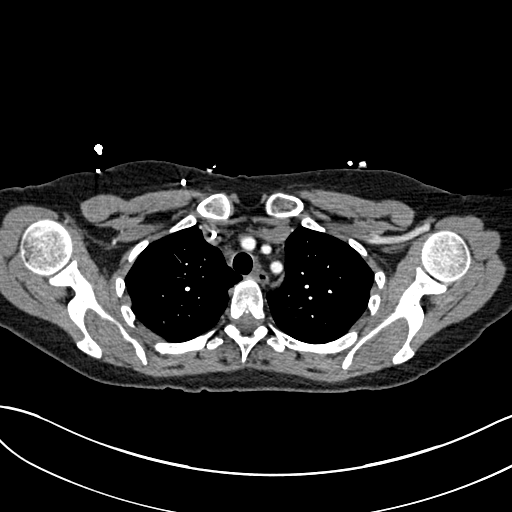
[im 202/238  lung]
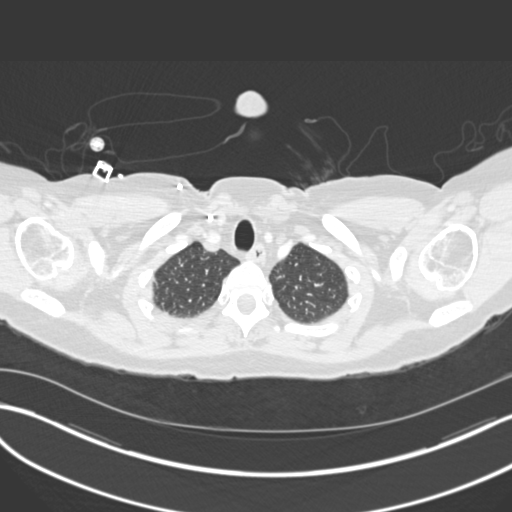
[im 214/238  mediastinal]
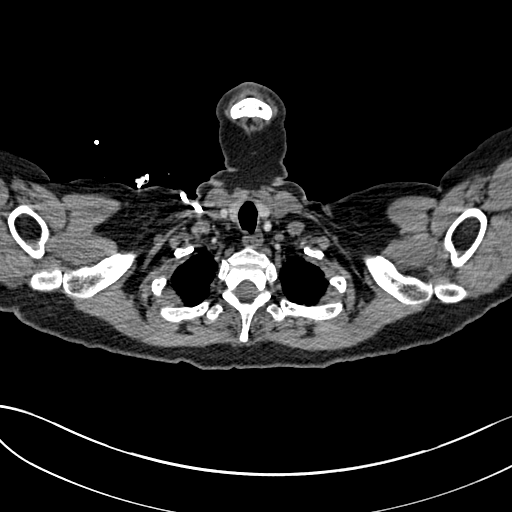
[im 226/238  lung]
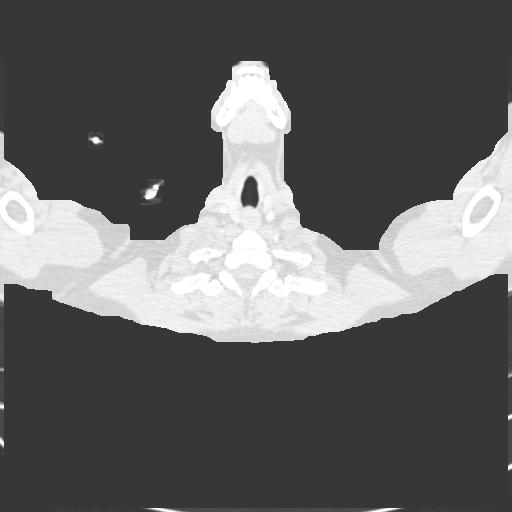

[19 of 32 positions shown; findings below may reference images not displayed]

FINDINGS: There is good contrast opacification of the pulmonary
artery branches.  No discrete filling defect to suggest acute
PE.]Adequate contrast opacification of the thoracic aorta with no
evidence of dissection, aneurysm, or stenosis. There is classic 3-
vessel brachiocephalic arch anatomy.  No pleural or pericardial
effusion.  Right IJ port catheter extends to the proximal right
atrium.  No hilar or mediastinal adenopathy.  Innumerable low
attenuation liver lesions, much more extensive and apparent than on
prior PET CT, possibly related to interval radioembolization versus
significant progression of hepatic metastatic disease.
Embolization coils noted in the upper abdomen.  Lungs are clear.
Minimal dependent atelectasis posteriorly in the lower lobes.
Sclerotic sternal metastases are again noted.  Thoracic spine
intact.
IMPRESSION: 1.  Negative for acute PE or thoracic aortic dissection.
2. Patchy nodular liver parenchymal changes may represent sequelae
of recent radioembolization versus progression of hepatic
metastatic disease.

## 2013-06-03 IMAGING — NM NM HEPATOBILIARY IMAGE, INC GB
2 series · 12 of 12 positions shown · non-contrast
Comparison: Abdominal ultrasound 05/27/2012.

***ADDENDUM*** CREATED: 06/07/2012 [DATE]

The curvilinear activity projecting over the epigastric region is
likely technetium within the port access tubing.  This is not  [AGE]
activity as exam was performed 28 days post Y-90  therapy (greater
than 10 half lives).
***END ADDENDUM*** SIGNED BY: Adilson Oatman, M.D.
CLINICAL DATA: 42-year-old female with metastatic breast cancer.
Status post [AGE] micro sphere treatment of liver metastases April 2012.  Right upper quadrant pain and nausea.  Abnormal gallbladder
on recent ultrasound.
NUCLEAR MEDICINE HEPATOBILIARY IMAGING
TECHNIQUE: Sequential images of the abdomen were obtained [DATE] minutes following intravenous administration of
radiopharmaceutical.
Radiopharmaceutical:  3.Sm0i 3c-AAm Choletec

[Series 1: gb hepatobiliary scan · 4.75mm/px · 6 of 12 frames shown (1 of 2)]
[frame 2/12]
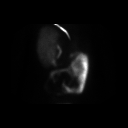
[frame 4/12]
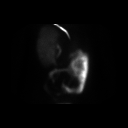
[frame 6/12]
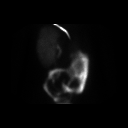
[frame 8/12]
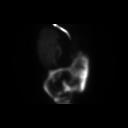
[frame 10/12]
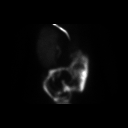
[frame 12/12]
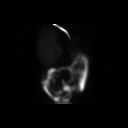

[Series 1: gb hepatobiliary scan · 4.75mm/px · 6 of 60 frames shown (2 of 2)]
[frame 6/60]
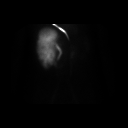
[frame 16/60]
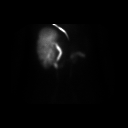
[frame 26/60]
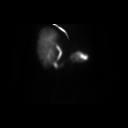
[frame 36/60]
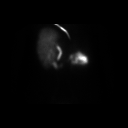
[frame 46/60]
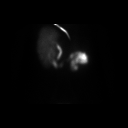
[frame 56/60]
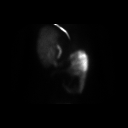

[12 of 12 positions shown; findings below may reference images not displayed]

Chest CT [REDACTED]: Liver uptake of the radiotracer is mildly heterogeneous,
the blood pool clears relatively quickly.

There is curvilinear activity at the level of the diaphragm,
present on the first image and unchanged throughout the study.
Favor this reflects a non target embolization related to the
11/10/2015 therapy.

There is prompt activity in the CBD and small bowel.  Small bowel
activity increases, but after 2 hours of imaging no convincing
gallbladder activity is identified.
IMPRESSION: No gallbladder filling.  CBD is patent.  Extra-hepatic activity
which is unchanged throughout the study probably reflects diaphragm
activity from non-target [AGE] embolization in [REDACTED].

Study reviewed with Dr. Jhemboy Padam.

## 2013-12-02 ENCOUNTER — Other Ambulatory Visit: Payer: Self-pay | Admitting: Pharmacist

## 2021-08-10 NOTE — Telephone Encounter (Signed)
Error
